# Patient Record
Sex: Female | Born: 1935 | Race: White | Hispanic: No | Marital: Single | Smoking: Former smoker
Health system: Southern US, Community
[De-identification: ages and names within clinical notes are randomized; demographics above are authoritative.]

## PROBLEM LIST (undated history)

## (undated) DIAGNOSIS — T7840XA Allergy, unspecified, initial encounter: Secondary | ICD-10-CM

## (undated) DIAGNOSIS — E785 Hyperlipidemia, unspecified: Secondary | ICD-10-CM

## (undated) DIAGNOSIS — I10 Essential (primary) hypertension: Secondary | ICD-10-CM

## (undated) DIAGNOSIS — I809 Phlebitis and thrombophlebitis of unspecified site: Secondary | ICD-10-CM

## (undated) DIAGNOSIS — M1990 Unspecified osteoarthritis, unspecified site: Secondary | ICD-10-CM

## (undated) DIAGNOSIS — J449 Chronic obstructive pulmonary disease, unspecified: Secondary | ICD-10-CM

## (undated) DIAGNOSIS — C50912 Malignant neoplasm of unspecified site of left female breast: Secondary | ICD-10-CM

## (undated) DIAGNOSIS — K219 Gastro-esophageal reflux disease without esophagitis: Secondary | ICD-10-CM

## (undated) HISTORY — DX: Essential (primary) hypertension: I10

## (undated) HISTORY — PX: TOTAL HIP ARTHROPLASTY: SHX124

## (undated) HISTORY — PX: EYE SURGERY: SHX253

## (undated) HISTORY — DX: Unspecified osteoarthritis, unspecified site: M19.90

## (undated) HISTORY — PX: MASTECTOMY: SHX3

## (undated) HISTORY — DX: Allergy, unspecified, initial encounter: T78.40XA

## (undated) HISTORY — DX: Hyperlipidemia, unspecified: E78.5

## (undated) HISTORY — DX: Phlebitis and thrombophlebitis of unspecified site: I80.9

## (undated) HISTORY — DX: Gastro-esophageal reflux disease without esophagitis: K21.9

## (undated) HISTORY — PX: JOINT REPLACEMENT: SHX530

---
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
## 2000-03-13 DIAGNOSIS — I639 Cerebral infarction, unspecified: Secondary | ICD-10-CM

## 2000-03-13 HISTORY — DX: Cerebral infarction, unspecified: I63.9

## 2011-01-02 DIAGNOSIS — R490 Dysphonia: Secondary | ICD-10-CM | POA: Insufficient documentation

## 2011-01-02 DIAGNOSIS — C50919 Malignant neoplasm of unspecified site of unspecified female breast: Secondary | ICD-10-CM | POA: Insufficient documentation

## 2011-01-02 DIAGNOSIS — I6529 Occlusion and stenosis of unspecified carotid artery: Secondary | ICD-10-CM | POA: Insufficient documentation

## 2011-01-02 DIAGNOSIS — K219 Gastro-esophageal reflux disease without esophagitis: Secondary | ICD-10-CM | POA: Insufficient documentation

## 2011-01-03 DIAGNOSIS — J381 Polyp of vocal cord and larynx: Secondary | ICD-10-CM | POA: Insufficient documentation

## 2011-06-15 DIAGNOSIS — Z9181 History of falling: Secondary | ICD-10-CM | POA: Insufficient documentation

## 2012-03-08 DIAGNOSIS — B351 Tinea unguium: Secondary | ICD-10-CM | POA: Insufficient documentation

## 2012-03-13 HISTORY — PX: BREAST BIOPSY: SHX20

## 2012-03-13 HISTORY — PX: BREAST LUMPECTOMY: SHX2

## 2012-08-21 DIAGNOSIS — G459 Transient cerebral ischemic attack, unspecified: Secondary | ICD-10-CM | POA: Insufficient documentation

## 2013-03-13 DIAGNOSIS — C50912 Malignant neoplasm of unspecified site of left female breast: Secondary | ICD-10-CM

## 2013-03-13 HISTORY — PX: TOTAL HIP ARTHROPLASTY: SHX124

## 2013-03-13 HISTORY — DX: Malignant neoplasm of unspecified site of left female breast: C50.912

## 2013-07-03 DIAGNOSIS — F3289 Other specified depressive episodes: Secondary | ICD-10-CM | POA: Insufficient documentation

## 2013-07-03 DIAGNOSIS — F329 Major depressive disorder, single episode, unspecified: Secondary | ICD-10-CM | POA: Insufficient documentation

## 2014-05-26 DIAGNOSIS — I739 Peripheral vascular disease, unspecified: Secondary | ICD-10-CM | POA: Insufficient documentation

## 2014-05-26 DIAGNOSIS — Z8781 Personal history of (healed) traumatic fracture: Secondary | ICD-10-CM | POA: Insufficient documentation

## 2014-08-22 DIAGNOSIS — Z853 Personal history of malignant neoplasm of breast: Secondary | ICD-10-CM | POA: Insufficient documentation

## 2014-12-18 DIAGNOSIS — IMO0002 Reserved for concepts with insufficient information to code with codable children: Secondary | ICD-10-CM | POA: Insufficient documentation

## 2015-11-17 DIAGNOSIS — R7303 Prediabetes: Secondary | ICD-10-CM | POA: Insufficient documentation

## 2016-05-03 DIAGNOSIS — I10 Essential (primary) hypertension: Secondary | ICD-10-CM | POA: Diagnosis not present

## 2016-05-03 DIAGNOSIS — Z79899 Other long term (current) drug therapy: Secondary | ICD-10-CM | POA: Diagnosis not present

## 2016-07-12 DIAGNOSIS — I6523 Occlusion and stenosis of bilateral carotid arteries: Secondary | ICD-10-CM | POA: Diagnosis not present

## 2016-07-12 DIAGNOSIS — Z8673 Personal history of transient ischemic attack (TIA), and cerebral infarction without residual deficits: Secondary | ICD-10-CM | POA: Diagnosis not present

## 2016-11-01 DIAGNOSIS — J439 Emphysema, unspecified: Secondary | ICD-10-CM | POA: Diagnosis not present

## 2016-11-01 DIAGNOSIS — Z0000 Encounter for general adult medical examination without abnormal findings: Secondary | ICD-10-CM | POA: Diagnosis not present

## 2016-11-01 DIAGNOSIS — R7303 Prediabetes: Secondary | ICD-10-CM | POA: Diagnosis not present

## 2016-11-01 DIAGNOSIS — I10 Essential (primary) hypertension: Secondary | ICD-10-CM | POA: Diagnosis not present

## 2016-11-07 DIAGNOSIS — L820 Inflamed seborrheic keratosis: Secondary | ICD-10-CM | POA: Diagnosis not present

## 2016-11-07 DIAGNOSIS — L7211 Pilar cyst: Secondary | ICD-10-CM | POA: Diagnosis not present

## 2016-11-07 DIAGNOSIS — I10 Essential (primary) hypertension: Secondary | ICD-10-CM | POA: Diagnosis not present

## 2017-01-11 DIAGNOSIS — E782 Mixed hyperlipidemia: Secondary | ICD-10-CM | POA: Diagnosis not present

## 2017-01-11 DIAGNOSIS — I10 Essential (primary) hypertension: Secondary | ICD-10-CM | POA: Diagnosis not present

## 2017-01-11 DIAGNOSIS — Z5181 Encounter for therapeutic drug level monitoring: Secondary | ICD-10-CM | POA: Diagnosis not present

## 2017-01-11 DIAGNOSIS — Z0000 Encounter for general adult medical examination without abnormal findings: Secondary | ICD-10-CM | POA: Diagnosis not present

## 2017-03-04 DIAGNOSIS — J069 Acute upper respiratory infection, unspecified: Secondary | ICD-10-CM | POA: Diagnosis not present

## 2017-06-13 DIAGNOSIS — M5136 Other intervertebral disc degeneration, lumbar region: Secondary | ICD-10-CM | POA: Diagnosis not present

## 2017-06-13 DIAGNOSIS — J449 Chronic obstructive pulmonary disease, unspecified: Secondary | ICD-10-CM | POA: Diagnosis not present

## 2017-06-13 DIAGNOSIS — R7303 Prediabetes: Secondary | ICD-10-CM | POA: Diagnosis not present

## 2017-06-13 DIAGNOSIS — I10 Essential (primary) hypertension: Secondary | ICD-10-CM | POA: Diagnosis not present

## 2017-06-13 DIAGNOSIS — M549 Dorsalgia, unspecified: Secondary | ICD-10-CM | POA: Diagnosis not present

## 2017-06-13 DIAGNOSIS — M4856XA Collapsed vertebra, not elsewhere classified, lumbar region, initial encounter for fracture: Secondary | ICD-10-CM | POA: Diagnosis not present

## 2017-06-13 DIAGNOSIS — E785 Hyperlipidemia, unspecified: Secondary | ICD-10-CM | POA: Diagnosis not present

## 2017-06-13 DIAGNOSIS — M47896 Other spondylosis, lumbar region: Secondary | ICD-10-CM | POA: Diagnosis not present

## 2017-06-14 DIAGNOSIS — H353132 Nonexudative age-related macular degeneration, bilateral, intermediate dry stage: Secondary | ICD-10-CM | POA: Diagnosis not present

## 2017-06-14 DIAGNOSIS — H26491 Other secondary cataract, right eye: Secondary | ICD-10-CM | POA: Diagnosis not present

## 2017-06-14 DIAGNOSIS — Z9842 Cataract extraction status, left eye: Secondary | ICD-10-CM | POA: Diagnosis not present

## 2017-06-14 DIAGNOSIS — Z9841 Cataract extraction status, right eye: Secondary | ICD-10-CM | POA: Diagnosis not present

## 2018-02-27 ENCOUNTER — Encounter: Payer: Self-pay | Admitting: Family

## 2018-02-27 ENCOUNTER — Ambulatory Visit: Payer: Medicare Other | Admitting: Family

## 2018-02-27 VITALS — BP 140/55 | HR 68 | Temp 97.6°F | Ht 66.0 in | Wt 148.8 lb

## 2018-02-27 DIAGNOSIS — I10 Essential (primary) hypertension: Secondary | ICD-10-CM

## 2018-02-27 DIAGNOSIS — Z13820 Encounter for screening for osteoporosis: Secondary | ICD-10-CM | POA: Insufficient documentation

## 2018-02-27 DIAGNOSIS — H9190 Unspecified hearing loss, unspecified ear: Secondary | ICD-10-CM

## 2018-02-27 DIAGNOSIS — F320 Major depressive disorder, single episode, mild: Secondary | ICD-10-CM | POA: Diagnosis not present

## 2018-02-27 DIAGNOSIS — J449 Chronic obstructive pulmonary disease, unspecified: Secondary | ICD-10-CM

## 2018-02-27 DIAGNOSIS — R413 Other amnesia: Secondary | ICD-10-CM

## 2018-02-27 DIAGNOSIS — Z8673 Personal history of transient ischemic attack (TIA), and cerebral infarction without residual deficits: Secondary | ICD-10-CM

## 2018-02-27 DIAGNOSIS — E785 Hyperlipidemia, unspecified: Secondary | ICD-10-CM

## 2018-02-27 DIAGNOSIS — Z0000 Encounter for general adult medical examination without abnormal findings: Secondary | ICD-10-CM | POA: Insufficient documentation

## 2018-02-27 MED ORDER — TRAZODONE HCL 50 MG PO TABS: 25.0000 mg | ORAL_TABLET | Freq: Every day | ORAL | 1 refills | Status: DC

## 2018-02-27 NOTE — Assessment & Plan Note (Signed)
Chronic.  Suspect concerns for memory are more related to hearing loss.  Referral to ear nose and throat.  Reassured by the mental status exam, 30/30.

## 2018-02-27 NOTE — Assessment & Plan Note (Addendum)
Discussed benefit and risk of Plavix.  Currently patient and I jointly agreed benefit of Plavix outweigh risk.  Discussed if she were to begin to have problems with falls, we may opt to discontinue at that time.  We will continue for now.  Of note, politely declines reestablishment with neurology at this time.

## 2018-02-27 NOTE — Assessment & Plan Note (Signed)
Systolic blood pressure in the 140s which is consistent at home. diastolic mid 01B, so I feel hesitant to increase her blood pressure medication this time.  Discussed referral to cardiology for further advice regarding this.  Patient politely declines.  Will continue current regimen at this time.

## 2018-02-27 NOTE — Assessment & Plan Note (Signed)
Pending lipid panel 

## 2018-02-27 NOTE — Progress Notes (Signed)
Subjective:    Patient ID: Natalie [name], female    DOB: 10-11-1929, 84 y.o.   MRN: [id number]  CC: [name] is a 84 y.o. female who presents today to establish care.    HPI: Recently moved from [place].  Prior PCP Dr Truman Hayward. Lives with daughter.   Retired Health and safety inspector.   Walks regularly, over 3000 steps yesterday. Would like to join permission. No recent falls. h/o right hip fracture.   On calcium.   Wearing aids. Has seen ENT in [place].  Still driving; hasnt gotten lost nor forgotten loves ones names.   History of left breast cancer 2017; s/p left mastectomy. No longer doing mammograms.   HTN- at home, 140/ 67. Denies exertional chest pain or pressure, numbness or tingling radiating to left arm or jaw, palpitations, dizziness, frequent headaches, changes in vision, or shortness of breath.    HLD-compliant with Lipitor.  H/o depression after husbands death.  Was treated at that time.  Some trouble going back to sleep. No si/hi. Declines counseling.    COPD- On Sprivia. No wheezing,increased sputum.   H/o TIA when smoking- on Plavix .  Bruises easily.  No bleeding. Had seen neurology in the past.   Former smoker        HISTORY:  Past Medical History:  Diagnosis Date  . Allergies    hay fever  . Arthritis   . GERD (gastroesophageal reflux disease)   . Hyperlipemia   . Hypertension   . Phlebitis    TIA   Past Surgical History:  Procedure Laterality Date  . BREAST BIOPSY  2014  . BREAST LUMPECTOMY  2014  . MASTECTOMY Left May 9th 2017  . RHINOPLASTY  1956  . TONSILLECTOMY  1939  . TOTAL HIP ARTHROPLASTY Right 2015   History reviewed. No pertinent family history.  Allergies: Patient has no allergy information on record. Current Outpatient Medications on File Prior to Visit  Medication Sig Dispense Refill  . amLODipine (NORVASC) 10 MG tablet Take 10 mg by mouth daily.    Marland Kitchen atorvastatin (LIPITOR) 20 MG tablet Take 20 mg by mouth daily.      Marland Kitchen b complex vitamins tablet Take 1 tablet by mouth daily.    . Calcium Citrate-Vitamin D 250-200 MG-UNIT TABS Take 250 mg by mouth every morning.    . cholecalciferol (VITAMIN D3) 25 MCG (1000 UT) tablet Take 2,000 Units by mouth daily.    . clopidogrel (PLAVIX) 75 MG tablet Take 75 mg by mouth daily.    Marland Kitchen lisinopril (PRINIVIL,ZESTRIL) 5 MG tablet Take 5 mg by mouth daily.    . metoprolol tartrate (LOPRESSOR) 50 MG tablet Take 50 mg by mouth daily.    . Tiotropium Bromide Monohydrate 2.5 MCG/ACT AERS Inhale 2 puffs into the lungs 2 (two) times daily.     No current facility-administered medications on file prior to visit.     Social History   Tobacco Use  . Smoking status: Former Research scientist (life sciences)  . Smokeless tobacco: Never Used  Substance Use Topics  . Alcohol use: Yes  . Drug use: Never    Review of Systems  Constitutional: Negative for chills and fever.  HENT: Positive for hearing loss (chronic). Negative for congestion.   Respiratory: Negative for cough, shortness of breath and wheezing.   Cardiovascular: Negative for chest pain and palpitations.  Gastrointestinal: Negative for nausea and vomiting.  Neurological: Negative for dizziness.  Hematological: Bruises/bleeds easily.  Psychiatric/Behavioral: Positive for sleep disturbance. Negative for suicidal  ideas. The patient is not nervous/anxious.       Objective:    BP (!) 140/55   Pulse 68   Temp 97.6 F (36.4 C)   Ht 5\' 6"  (1.676 m)   Wt 148 lb 12.8 oz (67.5 kg)   SpO2 98%   BMI 24.02 kg/m  BP Readings from Last 3 Encounters:  02/27/18 (!) 140/55   Wt Readings from Last 3 Encounters:  02/27/18 148 lb 12.8 oz (67.5 kg)    Physical Exam Vitals signs reviewed.  Constitutional:      Appearance: She is well-developed.  Eyes:     Conjunctiva/sclera: Conjunctivae normal.  Cardiovascular:     Rate and Rhythm: Normal rate and regular rhythm.     Pulses: Normal pulses.     Heart sounds: Normal heart sounds.  Pulmonary:      Effort: Pulmonary effort is normal.     Breath sounds: Normal breath sounds. No wheezing, rhonchi or rales.  Skin:    General: Skin is warm and dry.  Neurological:     Mental Status: She is alert.  Psychiatric:        Speech: Speech normal.        Behavior: Behavior normal.        Thought Content: Thought content normal.        Assessment & Plan:   Problem List Items Addressed This Visit      Cardiovascular and Mediastinum   Essential hypertension    Systolic blood pressure in the 140s which is consistent at home. diastolic mid 83M, so I feel hesitant to increase her blood pressure medication this time.  Discussed referral to cardiology for further advice regarding this.  Patient politely declines.  Will continue current regimen at this time.      Relevant Medications   amLODipine (NORVASC) 10 MG tablet   atorvastatin (LIPITOR) 20 MG tablet   lisinopril (PRINIVIL,ZESTRIL) 5 MG tablet   metoprolol tartrate (LOPRESSOR) 50 MG tablet   Other Relevant Orders   Comprehensive metabolic panel   Microalbumin / creatinine urine ratio     Respiratory   COPD (chronic obstructive pulmonary disease) (HCC)    Symptomatically stable.  Will continue Spiriva      Relevant Medications   Tiotropium Bromide Monohydrate 2.5 MCG/ACT AERS     Nervous and Auditory   Hearing loss    Chronic.  Suspect concerns for memory are more related to hearing loss.  Referral to ear nose and throat.  Reassured by the mental status exam, 30/30.      Relevant Orders   Ambulatory referral to ENT     Other   Encounter for medical examination to establish care - Primary   Relevant Medications   traZODone (DESYREL) 50 MG tablet   Other Relevant Orders   DG Bone Density   Ambulatory referral to ENT   Lipid panel   Comprehensive metabolic panel   CBC with Differential/Platelet   Hemoglobin A1c   TSH   VITAMIN D 25 Hydroxy (Vit-D Deficiency, Fractures)   Microalbumin / creatinine urine ratio    Screening for osteoporosis   Relevant Orders   DG Bone Density   Depression, major, single episode, mild (HCC)    Trial of trazodone.  Follow-up in 3 months.      Relevant Medications   traZODone (DESYREL) 50 MG tablet   Memory changes    Reassured by the mental status exam, 30/30.       HLD (hyperlipidemia)  Pending lipid panel.      Relevant Medications   amLODipine (NORVASC) 10 MG tablet   atorvastatin (LIPITOR) 20 MG tablet   lisinopril (PRINIVIL,ZESTRIL) 5 MG tablet   metoprolol tartrate (LOPRESSOR) 50 MG tablet   History of TIA (transient ischemic attack)    Discussed benefit and risk of Plavix.  Currently patient and I jointly agreed benefit of Plavix outweigh risk.  Discussed if she were to begin to have problems with falls, we may opt to discontinue at that time.  We will continue for now.  Of note, politely declines reestablishment with neurology at this time.          I am having Joaquim Lai A. Ashford start on traZODone. I am also having her maintain her amLODipine, atorvastatin, b complex vitamins, Calcium Citrate-Vitamin D, clopidogrel, lisinopril, metoprolol tartrate, Tiotropium Bromide Monohydrate, and cholecalciferol.   Meds ordered this encounter  Medications  . traZODone (DESYREL) 50 MG tablet    Sig: Take 0.5 tablets (25 mg total) by mouth at bedtime.    Dispense:  90 tablet    Refill:  1    Order Specific Question:   Supervising Provider    Answer:   Crecencio Mc [[id number]]    Return precautions given.   Risks, benefits, and alternatives of the medications and treatment plan prescribed today were discussed, and patient expressed understanding.   Education regarding symptom management and diagnosis given to patient on AVS.  Continue to follow with [name], FNP for routine health maintenance.   Natalie [name] and I agreed with plan.   Mable Paris, FNP

## 2018-02-27 NOTE — Assessment & Plan Note (Signed)
Symptomatically stable.  Will continue Spiriva

## 2018-02-27 NOTE — Assessment & Plan Note (Signed)
Trial of trazodone.  Follow-up in 3 months.

## 2018-02-27 NOTE — Assessment & Plan Note (Signed)
Reassured by the mental status exam, 30/30.

## 2018-02-27 NOTE — Patient Instructions (Addendum)
Trial of trazodone at bedtime. Fasting labs when can-you can make this appointment at checkout  Monitor blood pressure,  Goal is less than 140/80, based on newest guidelines; if persistently higher, please make sooner follow up appointment so we can recheck you blood pressure and manage medications.    such a pleasure meeting you!

## 2018-03-04 ENCOUNTER — Other Ambulatory Visit: Payer: Medicare Other

## 2018-03-12 ENCOUNTER — Other Ambulatory Visit (INDEPENDENT_AMBULATORY_CARE_PROVIDER_SITE_OTHER): Payer: Medicare Other

## 2018-03-12 DIAGNOSIS — I10 Essential (primary) hypertension: Secondary | ICD-10-CM

## 2018-03-12 DIAGNOSIS — Z0000 Encounter for general adult medical examination without abnormal findings: Secondary | ICD-10-CM | POA: Diagnosis not present

## 2018-03-12 LAB — MICROALBUMIN / CREATININE URINE RATIO
Creatinine,U: 95.3 mg/dL
Microalb Creat Ratio: 2.9 mg/g (ref 0.0–30.0)
Microalb, Ur: 2.8 mg/dL — ABNORMAL HIGH (ref 0.0–1.9)

## 2018-03-12 LAB — CBC WITH DIFFERENTIAL/PLATELET
Basophils Absolute: 0.1 10*3/uL (ref 0.0–0.1)
Basophils Relative: 0.8 % (ref 0.0–3.0)
Eosinophils Absolute: 0.2 10*3/uL (ref 0.0–0.7)
Eosinophils Relative: 2.7 % (ref 0.0–5.0)
HCT: 36.7 % (ref 36.0–46.0)
Hemoglobin: 12.5 g/dL (ref 12.0–15.0)
Lymphocytes Relative: 21.1 % (ref 12.0–46.0)
Lymphs Abs: 1.5 10*3/uL (ref 0.7–4.0)
MCHC: 34.1 g/dL (ref 30.0–36.0)
MCV: 92.5 fl (ref 78.0–100.0)
Monocytes Absolute: 0.7 10*3/uL (ref 0.1–1.0)
Monocytes Relative: 10.3 % (ref 3.0–12.0)
Neutro Abs: 4.7 10*3/uL (ref 1.4–7.7)
Neutrophils Relative %: 65.1 % (ref 43.0–77.0)
Platelets: 207 10*3/uL (ref 150.0–400.0)
RBC: 3.97 Mil/uL (ref 3.87–5.11)
RDW: 13.4 % (ref 11.5–15.5)
WBC: 7.2 10*3/uL (ref 4.0–10.5)

## 2018-03-12 LAB — LIPID PANEL
Cholesterol: 141 mg/dL (ref 0–200)
HDL: 45.5 mg/dL (ref >39.00)
LDL Cholesterol: 69 mg/dL (ref 0–99)
NonHDL: 95.43
Total CHOL/HDL Ratio: 3
Triglycerides: 133 mg/dL (ref 0.0–149.0)
VLDL: 26.6 mg/dL (ref 0.0–40.0)

## 2018-03-12 LAB — VITAMIN D 25 HYDROXY (VIT D DEFICIENCY, FRACTURES): VITD: 57.81 ng/mL (ref 30.00–100.00)

## 2018-03-12 LAB — COMPREHENSIVE METABOLIC PANEL
ALT: 12 U/L (ref 0–35)
AST: 15 U/L (ref 0–37)
Albumin: 4.3 g/dL (ref 3.5–5.2)
Alkaline Phosphatase: 53 U/L (ref 39–117)
BUN: 18 mg/dL (ref 6–23)
CO2: 28 mEq/L (ref 19–32)
Calcium: 9.8 mg/dL (ref 8.4–10.5)
Chloride: 102 mEq/L (ref 96–112)
Creatinine, Ser: 0.98 mg/dL (ref 0.40–1.20)
GFR: 57.11 mL/min — ABNORMAL LOW (ref >60.00)
Glucose, Bld: 126 mg/dL — ABNORMAL HIGH (ref 70–99)
Potassium: 4.4 mEq/L (ref 3.5–5.1)
Sodium: 138 mEq/L (ref 135–145)
Total Bilirubin: 0.6 mg/dL (ref 0.2–1.2)
Total Protein: 6.8 g/dL (ref 6.0–8.3)

## 2018-03-12 LAB — HEMOGLOBIN A1C: Hgb A1c MFr Bld: 6.3 % (ref 4.6–6.5)

## 2018-03-12 LAB — TSH: TSH: 2.64 u[IU]/mL (ref 0.35–4.50)

## 2018-03-18 ENCOUNTER — Other Ambulatory Visit: Payer: Self-pay | Admitting: Family

## 2018-03-18 NOTE — Telephone Encounter (Signed)
Copied from [place] [id number]. Topic: Quick Communication - Rx Refill/Question >> Mar 18, 2018 10:54 AM Bea Graff, NT wrote: Medication: clopidogrel (PLAVIX) 75 MG tablet, metoprolol tartrate (LOPRESSOR) 50 MG tablet, amLODipine (NORVASC) 10 MG tablet, lisinopril (PRINIVIL,ZESTRIL) 5 MG tablet, atorvastatin (LIPITOR) 20 MG tablet   Has the patient contacted their pharmacy? Yes.   (Agent: If no, request that the patient contact the pharmacy for the refill.) (Agent: If yes, when and what did the pharmacy advise?)  Preferred Pharmacy (with phone number or street name): [id number] [address], [place] [phone] (Phone) [phone] (Fax)    Agent: Please be advised that RX refills may take up to 3 business days. We ask that you follow-up with your pharmacy.

## 2018-03-25 DIAGNOSIS — H6123 Impacted cerumen, bilateral: Secondary | ICD-10-CM | POA: Diagnosis not present

## 2018-03-25 DIAGNOSIS — J309 Allergic rhinitis, unspecified: Secondary | ICD-10-CM | POA: Diagnosis not present

## 2018-03-25 DIAGNOSIS — H903 Sensorineural hearing loss, bilateral: Secondary | ICD-10-CM | POA: Diagnosis not present

## 2018-03-26 ENCOUNTER — Ambulatory Visit
Admission: RE | Admit: 2018-03-26 | Discharge: 2018-03-26 | Disposition: A | Discharge status: Home / Self Care | Payer: Medicare Other | Source: Ambulatory Visit | Attending: Family | Admitting: Family

## 2018-03-26 ENCOUNTER — Encounter: Payer: Self-pay | Admitting: Radiology

## 2018-03-26 DIAGNOSIS — M85852 Other specified disorders of bone density and structure, left thigh: Secondary | ICD-10-CM | POA: Diagnosis not present

## 2018-03-26 DIAGNOSIS — Z780 Asymptomatic menopausal state: Secondary | ICD-10-CM | POA: Diagnosis not present

## 2018-03-26 DIAGNOSIS — Z0000 Encounter for general adult medical examination without abnormal findings: Secondary | ICD-10-CM | POA: Insufficient documentation

## 2018-03-26 DIAGNOSIS — Z13820 Encounter for screening for osteoporosis: Secondary | ICD-10-CM | POA: Diagnosis not present

## 2018-05-29 ENCOUNTER — Ambulatory Visit (INDEPENDENT_AMBULATORY_CARE_PROVIDER_SITE_OTHER): Payer: Medicare Other | Admitting: Family

## 2018-05-29 ENCOUNTER — Other Ambulatory Visit: Payer: Self-pay

## 2018-05-29 ENCOUNTER — Encounter: Payer: Self-pay | Admitting: Family

## 2018-05-29 DIAGNOSIS — F320 Major depressive disorder, single episode, mild: Secondary | ICD-10-CM | POA: Diagnosis not present

## 2018-05-29 DIAGNOSIS — I10 Essential (primary) hypertension: Secondary | ICD-10-CM

## 2018-05-29 DIAGNOSIS — Z0000 Encounter for general adult medical examination without abnormal findings: Secondary | ICD-10-CM

## 2018-05-29 MED ORDER — TRAZODONE HCL 50 MG PO TABS: 25.0000 mg | ORAL_TABLET | Freq: Every day | ORAL | 1 refills | Status: DC

## 2018-05-29 NOTE — Patient Instructions (Signed)
Please continue to monitor your blood pressure however it was not making changes at this time.  Overall pleased.  Goal of 130/55 I think is reasonable.  Please let me know if this the bottom number is any lower than that. Stay safe!

## 2018-05-29 NOTE — Progress Notes (Signed)
Subjective:    Patient ID: Natalie [name], female    DOB: 1929-06-01, 85 y.o.   MRN: [id number]  CC: [name] is a 85 y.o. female who presents today for follow up.   HPI: Feels well today, no complaints   HTN- at home 130's. Had been on higher dose of lisinopril, has been 10mg .   Hearing loss- chronic. Unchanged. has seen Dr Tami Ribas. Started on flonase and congestion improved  Not as concerned about memory. Declines further evaluation , referral for this.     HISTORY:  Past Medical History:  Diagnosis Date  . Allergies    hay fever  . Arthritis   . GERD (gastroesophageal reflux disease)   . Hyperlipemia   . Hypertension   . Phlebitis    TIA   Past Surgical History:  Procedure Laterality Date  . BREAST BIOPSY  2014  . BREAST LUMPECTOMY  2014  . MASTECTOMY Left May 9th 2017  . RHINOPLASTY  1956  . TONSILLECTOMY  1939  . TOTAL HIP ARTHROPLASTY Right 2015   History reviewed. No pertinent family history.  Allergies: Influenza vaccines Current Outpatient Medications on File Prior to Visit  Medication Sig Dispense Refill  . amLODipine (NORVASC) 10 MG tablet TAKE ONE TABLET (10 MG TOTAL) BY MOUTH DAILY. 90 tablet 0  . atorvastatin (LIPITOR) 20 MG tablet TAKE 1 TABLET BY MOUTH EVERY DAY 90 tablet 0  . b complex vitamins tablet Take 1 tablet by mouth daily.    . Calcium Citrate-Vitamin D 250-200 MG-UNIT TABS Take 250 mg by mouth every morning.    . cholecalciferol (VITAMIN D3) 25 MCG (1000 UT) tablet Take 2,000 Units by mouth daily.    . clopidogrel (PLAVIX) 75 MG tablet TAKE 1 TABLET BY MOUTH EVERY DAY 90 tablet 3  . lisinopril (PRINIVIL,ZESTRIL) 5 MG tablet Take 5 mg by mouth daily.    . metoprolol tartrate (LOPRESSOR) 50 MG tablet TAKE 1 TABLET BY MOUTH EVERY DAY 90 tablet 0  . Tiotropium Bromide Monohydrate 2.5 MCG/ACT AERS Inhale 2 puffs into the lungs 2 (two) times daily.     No current facility-administered medications on file prior to visit.      Social History   Tobacco Use  . Smoking status: Former Research scientist (life sciences)  . Smokeless tobacco: Never Used  Substance Use Topics  . Alcohol use: Yes  . Drug use: Never    Review of Systems  Constitutional: Negative for chills and fever.  HENT: Positive for rhinorrhea (chronic, improved).   Respiratory: Negative for cough.   Cardiovascular: Negative for chest pain and palpitations.  Gastrointestinal: Negative for nausea and vomiting.      Objective:    BP 130/60   Pulse 75   Temp 97.7 F (36.5 C)   Wt 148 lb 12.8 oz (67.5 kg)   SpO2 97%   BMI 24.02 kg/m  BP Readings from Last 3 Encounters:  05/29/18 130/60  02/27/18 (!) 140/55   Wt Readings from Last 3 Encounters:  05/29/18 148 lb 12.8 oz (67.5 kg)  02/27/18 148 lb 12.8 oz (67.5 kg)    Physical Exam Vitals signs reviewed.  Constitutional:      Appearance: She is well-developed.  Eyes:     Conjunctiva/sclera: Conjunctivae normal.  Cardiovascular:     Rate and Rhythm: Normal rate and regular rhythm.     Pulses: Normal pulses.     Heart sounds: Normal heart sounds.  Pulmonary:     Effort: Pulmonary effort is normal.  Breath sounds: Normal breath sounds. No wheezing, rhonchi or rales.  Skin:    General: Skin is warm and dry.  Neurological:     Mental Status: She is alert.  Psychiatric:        Speech: Speech normal.        Behavior: Behavior normal.        Thought Content: Thought content normal.        Assessment & Plan:   Problem List Items Addressed This Visit      Cardiovascular and Mediastinum   Essential hypertension    Overall pleased blood pressure today.  Patient will continue to monitor of course, however we made no changes today        Other   Encounter for medical examination to establish care   Relevant Medications   traZODone (DESYREL) 50 MG tablet   Depression, major, single episode, mild (HCC)   Relevant Medications   traZODone (DESYREL) 50 MG tablet       I am having Joaquim Lai A.  Cornacchia maintain her b complex vitamins, Calcium Citrate-Vitamin D, lisinopril, Tiotropium Bromide Monohydrate, cholecalciferol, metoprolol tartrate, amLODipine, clopidogrel, atorvastatin, and traZODone.   Meds ordered this encounter  Medications  . traZODone (DESYREL) 50 MG tablet    Sig: Take 0.5 tablets (25 mg total) by mouth at bedtime.    Dispense:  90 tablet    Refill:  1    Order Specific Question:   Supervising Provider    Answer:   Crecencio Mc [[id number]]    Return precautions given.   Risks, benefits, and alternatives of the medications and treatment plan prescribed today were discussed, and patient expressed understanding.   Education regarding symptom management and diagnosis given to patient on AVS.  Continue to follow with [name], FNP for routine health maintenance.   Natalie [name] and I agreed with plan.   Mable Paris, FNP

## 2018-05-29 NOTE — Assessment & Plan Note (Signed)
Overall pleased blood pressure today.  Patient will continue to monitor of course, however we made no changes today

## 2018-06-10 ENCOUNTER — Other Ambulatory Visit: Payer: Self-pay | Admitting: Family

## 2018-06-14 ENCOUNTER — Other Ambulatory Visit: Payer: Self-pay | Admitting: Family

## 2018-08-30 ENCOUNTER — Ambulatory Visit: Payer: Medicare Other | Admitting: Family

## 2018-09-03 ENCOUNTER — Other Ambulatory Visit: Payer: Self-pay | Admitting: Family

## 2018-09-09 ENCOUNTER — Ambulatory Visit: Payer: Medicare Other | Admitting: Family

## 2018-09-09 ENCOUNTER — Other Ambulatory Visit: Payer: Self-pay

## 2018-09-09 ENCOUNTER — Encounter: Payer: Self-pay | Admitting: Family

## 2018-09-09 ENCOUNTER — Encounter: Payer: Medicare Other | Admitting: Family

## 2018-09-09 NOTE — Progress Notes (Signed)
This visit type was conducted due to national recommendations for restrictions regarding the COVID-19 pandemic (e.g. social distancing).  This format is felt to be most appropriate for this patient at this time.  All issues noted in this document were discussed and addressed.  No physical exam was performed (except for noted visual exam findings with Video Visits). Virtual Visit via Video Note  I connected with@  on 09/09/18 at  5:00 PM EDT by a video enabled telemedicine application and verified that I am speaking with the correct person using two identifiers.  Location patient: home Location provider:work or home office Persons participating in the virtual visit: patient, provider  I discussed the limitations of evaluation and management by telemedicine and the availability of in person appointments. The patient expressed understanding and agreed to proceed.   HPI:    ROS: See pertinent positives and negatives per HPI.  Past Medical History:  Diagnosis Date  . Allergies    hay fever  . Arthritis   . GERD (gastroesophageal reflux disease)   . Hyperlipemia   . Hypertension   . Phlebitis    TIA    Past Surgical History:  Procedure Laterality Date  . BREAST BIOPSY  2014  . BREAST LUMPECTOMY  2014  . MASTECTOMY Left May 9th 2017  . RHINOPLASTY  1956  . TONSILLECTOMY  1939  . TOTAL HIP ARTHROPLASTY Right 2015    History reviewed. No pertinent family history.     Current Outpatient Medications:  .  amLODipine (NORVASC) 10 MG tablet, TAKE 1 TABLET BY MOUTH EVERY DAY, Disp: 90 tablet, Rfl: 0 .  atorvastatin (LIPITOR) 20 MG tablet, TAKE 1 TABLET BY MOUTH EVERY DAY, Disp: 90 tablet, Rfl: 0 .  b complex vitamins tablet, Take 1 tablet by mouth daily., Disp: , Rfl:  .  Calcium Citrate-Vitamin D 250-200 MG-UNIT TABS, Take 250 mg by mouth every morning., Disp: , Rfl:  .  cholecalciferol (VITAMIN D3) 25 MCG (1000 UT) tablet, Take 2,000 Units by mouth daily., Disp: , Rfl:  .   clopidogrel (PLAVIX) 75 MG tablet, TAKE 1 TABLET BY MOUTH EVERY DAY, Disp: 90 tablet, Rfl: 3 .  lisinopril (ZESTRIL) 5 MG tablet, TAKE ONE TABLET (5 MG DOSE) BY MOUTH DAILY., Disp: 90 tablet, Rfl: 0 .  metoprolol tartrate (LOPRESSOR) 50 MG tablet, TAKE 1 TABLET BY MOUTH EVERY DAY, Disp: 90 tablet, Rfl: 0 .  Tiotropium Bromide Monohydrate 2.5 MCG/ACT AERS, Inhale 2 puffs into the lungs 2 (two) times daily., Disp: , Rfl:  .  traZODone (DESYREL) 50 MG tablet, Take 0.5 tablets (25 mg total) by mouth at bedtime., Disp: 90 tablet, Rfl: 1  EXAM:  VITALS per patient if applicable:  GENERAL: alert, oriented, appears well and in no acute distress  HEENT: atraumatic, conjunttiva clear, no obvious abnormalities on inspection of external nose and ears  NECK: normal movements of the head and neck  LUNGS: on inspection no signs of respiratory distress, breathing rate appears normal, no obvious gross SOB, gasping or wheezing  CV: no obvious cyanosis  MS: moves all visible extremities without noticeable abnormality  PSYCH/NEURO: pleasant and cooperative, no obvious depression or anxiety, speech and thought processing grossly intact  ASSESSMENT AND PLAN:  Discussed the following assessment and plan:  No diagnosis found.     I discussed the assessment and treatment plan with the patient. The patient was provided an opportunity to ask questions and all were answered. The patient agreed with the plan and demonstrated an understanding of  the instructions.   The patient was advised to call back or seek an in-person evaluation if the symptoms worsen or if the condition fails to improve as anticipated.   Mable Paris, FNP   This encounter was created in error - please disregard.

## 2018-09-10 ENCOUNTER — Telehealth: Payer: Self-pay

## 2018-09-10 NOTE — Telephone Encounter (Signed)
Copied from [place] [id number]. Topic: Quick Communication - See Telephone Encounter >> Sep 09, 2018  6:03 PM Loma Boston wrote: CRM for notification. See Telephone encounter for: 09/09/18. PT is so sorry that she missed appt today! @ 5:00 pm She waited all day for the appt and she was expecting a call,  expecting her phone to ring the second time after the first phone call. She says may have not understood the process and is very sorry. Would you please reach back out to her concerning this appt tomorrow?   After hrs

## 2018-09-11 ENCOUNTER — Telehealth: Payer: Self-pay | Admitting: Family

## 2018-09-11 NOTE — Telephone Encounter (Signed)
Call pt We were unable to connect on Monday.  Please offer another appt. If she was aware of this appt and doesn't reschedule, we will have to give no show fee.

## 2018-09-20 ENCOUNTER — Other Ambulatory Visit: Payer: Self-pay

## 2018-09-20 ENCOUNTER — Encounter: Payer: Self-pay | Admitting: Family

## 2018-09-20 ENCOUNTER — Ambulatory Visit (INDEPENDENT_AMBULATORY_CARE_PROVIDER_SITE_OTHER): Payer: Medicare Other | Admitting: Family

## 2018-09-20 DIAGNOSIS — Z8673 Personal history of transient ischemic attack (TIA), and cerebral infarction without residual deficits: Secondary | ICD-10-CM | POA: Diagnosis not present

## 2018-09-20 DIAGNOSIS — I10 Essential (primary) hypertension: Secondary | ICD-10-CM | POA: Diagnosis not present

## 2018-09-20 DIAGNOSIS — R0789 Other chest pain: Secondary | ICD-10-CM | POA: Diagnosis not present

## 2018-09-20 NOTE — Progress Notes (Signed)
Verbal consent for services obtained from patient prior to services given.  Location of call:  provider at work patient at home   Names of all persons present for services: Mable Paris, NP Chief complaint:   Left sided pain where 'breast was' when laying on left side in bed. Occurred 1-2 episode over last 2 months. Doesn't occur with activity.  Moved into 'comfortable position' and resolved on it's own. Didn't feel the need to take medication or try lidocaine patch. No shoulder pain, known injury. NO swelling, redness, rash.   H/o left mastectomy; no chemo or radiation.  XR thoracic spine 06/2017- chronic compression fracture L1.  No associated exertional chest pain or pressure, numbness or tingling radiating to left arm or jaw, palpitations, dizziness, frequent headaches, changes in vision, or shortness of breath.  No night sweats, unintentional weight loss.  H/o GERD. No regurgitation, epigastric burning. Had been on prilosec.   HTN- compliant with medication. At home 130-160/ 58-60. HR 56-65.   H/o carotid stenosis. No sudden vision loss.    History, background, results pertinent:   Needs prevnar 13  NO longer doing mammograms.   A/P/next steps: Problem List Items Addressed This Visit      Cardiovascular and Mediastinum   Essential hypertension    Labile.  Will trial taking medications in the evening and let me know if improves.         Other   History of TIA (transient ischemic attack) - Primary   Relevant Orders   US Carotid Duplex Bilateral   Chest wall pain    Appears musculoskeletal however discussed limitations of telemedicine in diagnosis. H/o left breast cancer. Declines CT chest, c-spine. Will let me know if recurs.           I spent 25 min  discussing plan of care over the phone.

## 2018-09-20 NOTE — Patient Instructions (Addendum)
Let me know if chest pain recurs - this is very important.  Certainly if you have  exertional chest pain or pressure, numbness or tingling radiating to left arm or jaw, palpitations, dizziness, frequent headaches, changes in vision, or shortness of breath- go to the emergency room.    Today we discussed referrals, orders. Carotid ultrasound   I have placed these orders in the system for you.  Please be sure to give [name] a call if you have not heard from our office regarding this. We should hear from [name] within ONE week with information regarding your appointment. If not, please let me know immediately.   Try taking  Blood pressure medication at bedtime and see if improves  Tetanus at local pharmacy  You also need th Prevnar 26 April 2019.   Stay safe!

## 2018-09-20 NOTE — Assessment & Plan Note (Addendum)
Appears musculoskeletal however discussed limitations of telemedicine in diagnosis. H/o left breast cancer. Declines CT chest, c-spine. Will let me know if recurs.

## 2018-09-20 NOTE — Assessment & Plan Note (Addendum)
Labile.  Will trial taking medications in the evening and let me know if improves.

## 2018-09-23 DIAGNOSIS — H903 Sensorineural hearing loss, bilateral: Secondary | ICD-10-CM | POA: Diagnosis not present

## 2018-09-23 DIAGNOSIS — H6123 Impacted cerumen, bilateral: Secondary | ICD-10-CM | POA: Diagnosis not present

## 2018-10-11 ENCOUNTER — Other Ambulatory Visit: Payer: Self-pay | Admitting: Family

## 2018-10-11 ENCOUNTER — Ambulatory Visit
Admission: RE | Admit: 2018-10-11 | Discharge: 2018-10-11 | Disposition: A | Discharge status: Home / Self Care | Payer: Medicare Other | Source: Ambulatory Visit | Attending: Family | Admitting: Family

## 2018-10-11 ENCOUNTER — Other Ambulatory Visit: Payer: Self-pay

## 2018-10-11 DIAGNOSIS — I6523 Occlusion and stenosis of bilateral carotid arteries: Secondary | ICD-10-CM | POA: Diagnosis not present

## 2018-10-11 DIAGNOSIS — Z8673 Personal history of transient ischemic attack (TIA), and cerebral infarction without residual deficits: Secondary | ICD-10-CM | POA: Diagnosis not present

## 2018-10-11 DIAGNOSIS — I6522 Occlusion and stenosis of left carotid artery: Secondary | ICD-10-CM

## 2018-10-11 DIAGNOSIS — I6503 Occlusion and stenosis of bilateral vertebral arteries: Secondary | ICD-10-CM | POA: Diagnosis not present

## 2018-10-11 IMAGING — US BILATERAL CAROTID DUPLEX ULTRASOUND
1 series · 13 of 24 positions shown · non-contrast
Comparison: None.

CLINICAL DATA: History of TIA, syncopal episode and hyperlipidemia.

EXAM:
BILATERAL CAROTID DUPLEX ULTRASOUND
TECHNIQUE: Gray scale imaging, color Doppler and duplex ultrasound were
performed of bilateral carotid and vertebral arteries in the neck.

[Series 1: bilateral carotid duplex ultrasound · 13 of 66 slices shown]
[im 1/66]
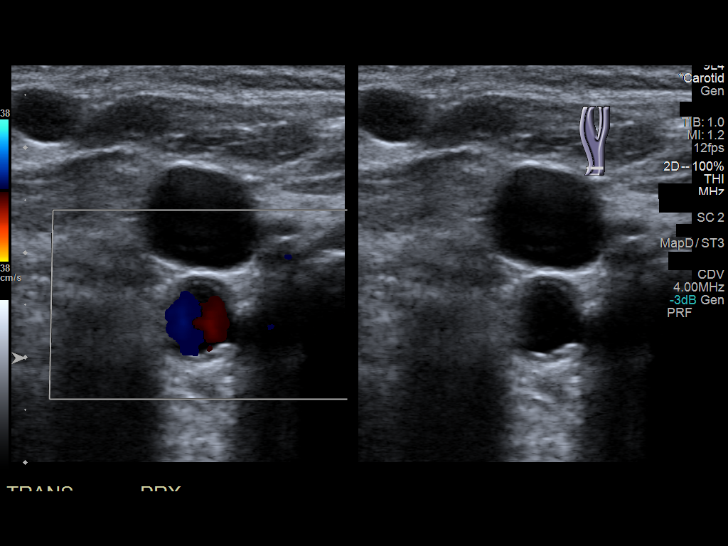
[im 6/66]
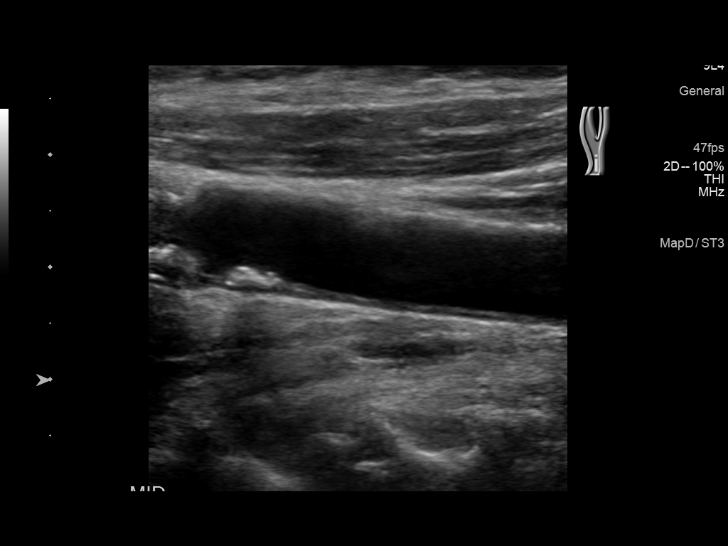
[im 12/66]
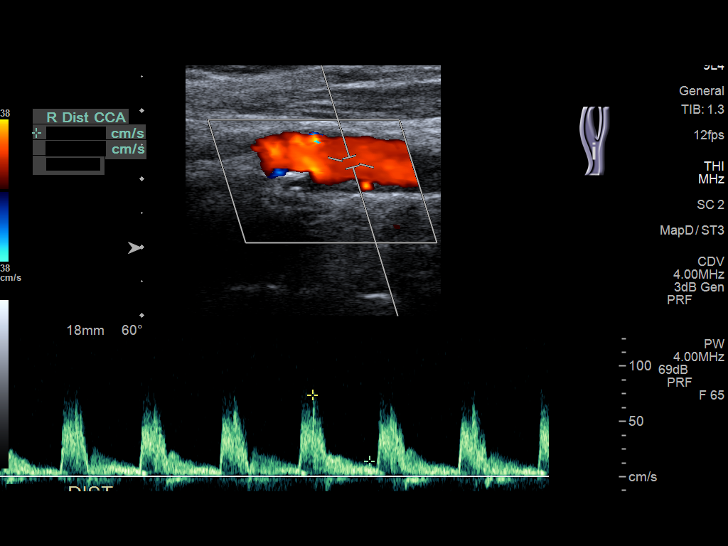
[im 17/66]
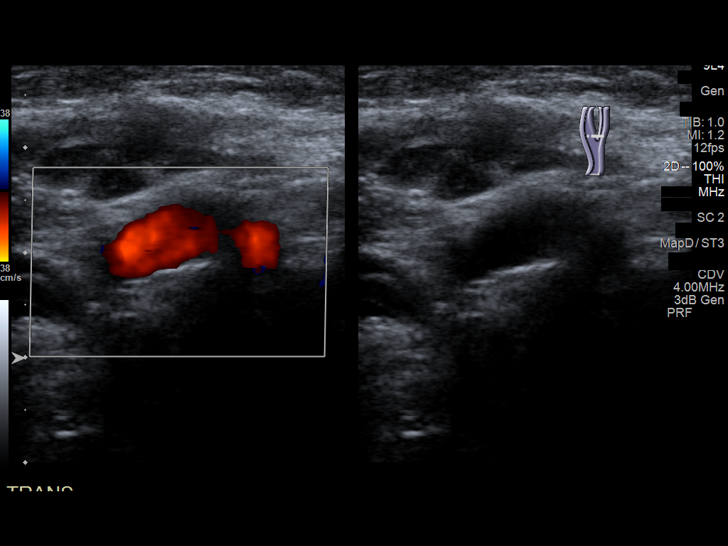
[im 23/66]
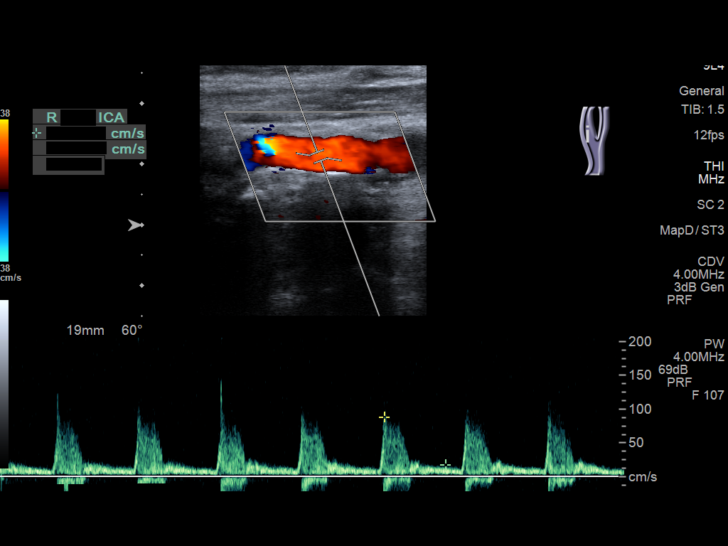
[im 29/66]
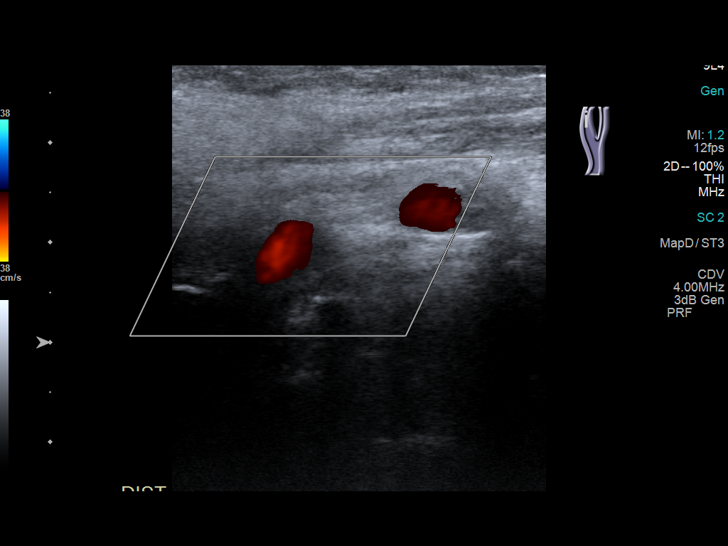
[im 34/66]
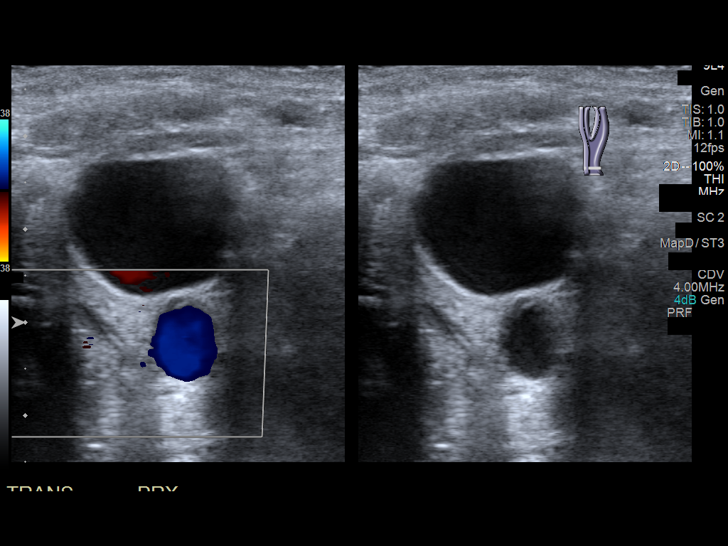
[im 37/66]
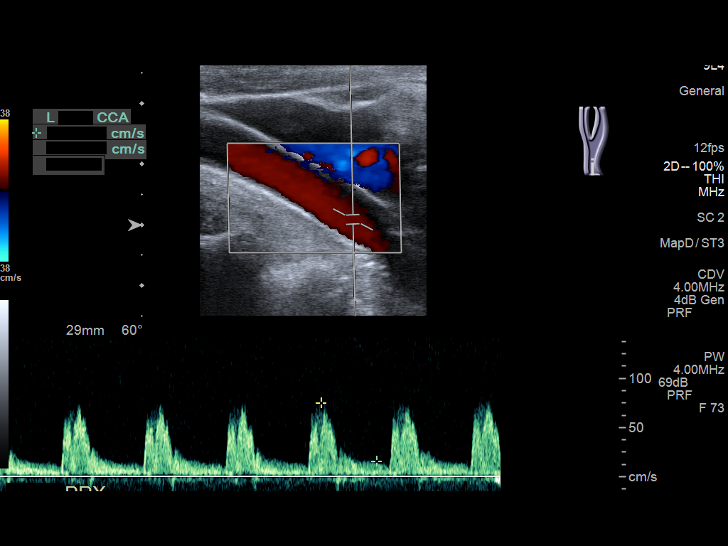
[im 43/66]
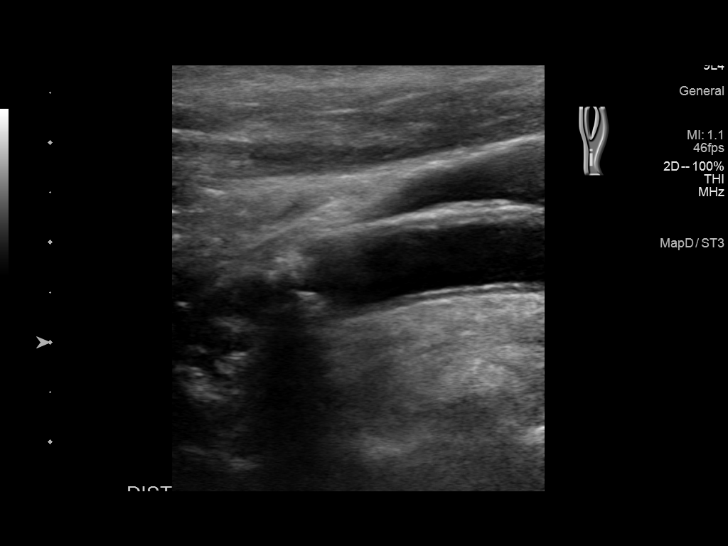
[im 49/66]
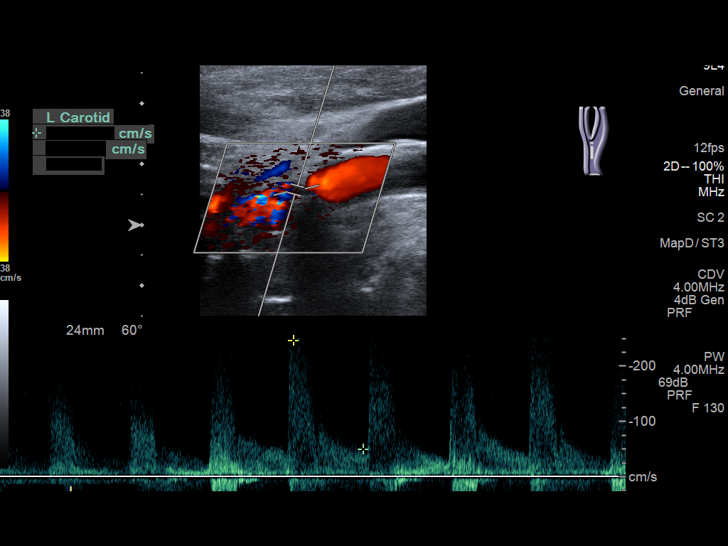
[im 54/66]
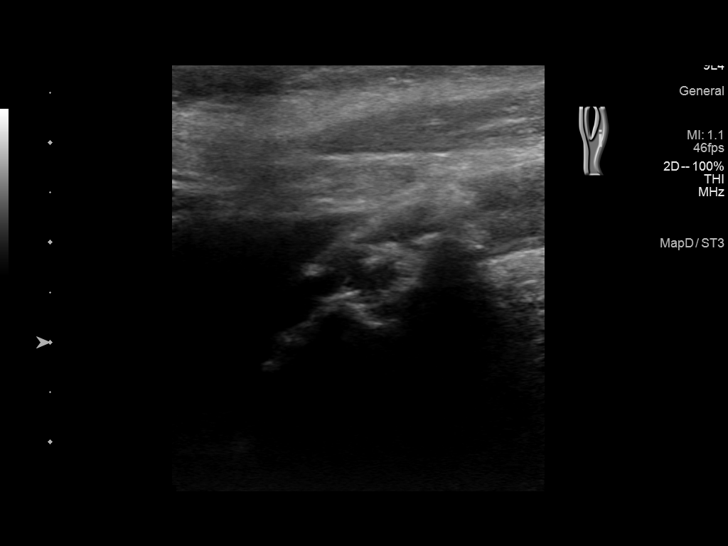
[im 60/66]
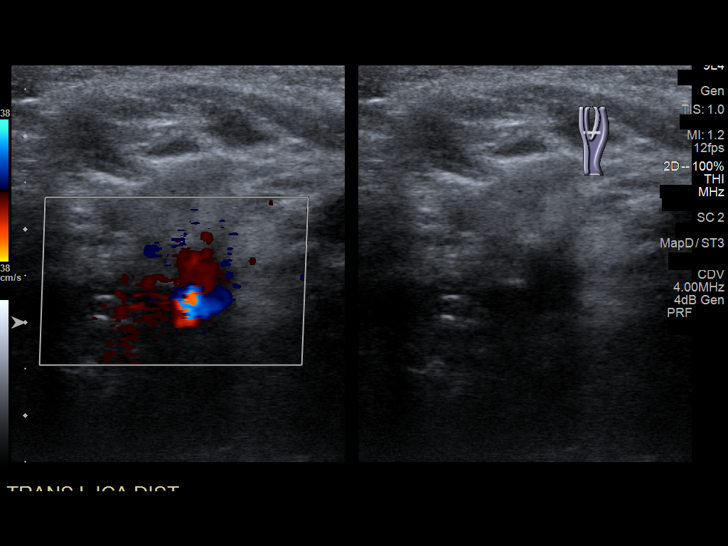
[im 66/66]
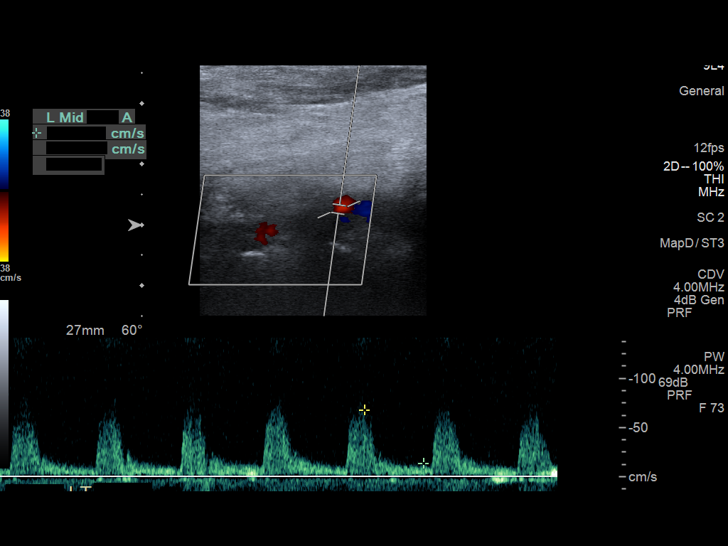

[13 of 24 positions shown; findings below may reference images not displayed]

FINDINGS: Criteria: Quantification of carotid stenosis is based on velocity
parameters that correlate the residual internal carotid diameter
with NASCET-based stenosis levels, using the diameter of the distal
internal carotid lumen as the denominator for stenosis measurement.

The following velocity measurements were obtained:

RIGHT

ICA: 102/22 cm/sec

CCA: 82/13 cm/sec

SYSTOLIC ICA/CCA RATIO:

ECA: 172 cm/sec

LEFT

ICA: 316/44 cm/sec

CCA: 67/14 cm/sec

SYSTOLIC ICA/CCA RATIO:

ECA: 160 cm/sec

RIGHT CAROTID ARTERY: There is a moderate amount of eccentric
echogenic plaque with the distal aspect the right common carotid
artery (image 11). There is a moderate to large amount of eccentric
echogenic plaque involving the origin and proximal aspects the right
internal carotid artery (image 22, which does not result in elevated
peak systolic velocities within the interrogated of course the right
internal carotid artery to suggest a hemodynamically significant
stenosis.

RIGHT VERTEBRAL ARTERY:  Antegrade Flow

LEFT CAROTID ARTERY: There is a large amount of eccentric echogenic
plaque within the left carotid bulb (image 48), which extends to
involve the origin and proximal aspect of the left internal carotid
artery (image 55) resulting in a sonographic string sign and
subtotal occlusion. Elevated velocities are seen throughout the
interrogated course the left internal carotid artery. Greatest
acquired peak systolic velocity within the proximal left ICA
measures 316 centimeters/second (image 57).

LEFT VERTEBRAL ARTERY:  Antegrade Flow
IMPRESSION: 1. Large amount of left-sided atherosclerotic plaque results in a
sonographic string sign, subtotal occlusion in markedly elevated
peak systolic velocities within the left internal carotid artery.
Further evaluation with CTA could performed as clinically indicated.
2. Moderate to large amount of right-sided atherosclerotic plaque,
not resulting in a hemodynamically significant stenosis.

This was made a call report.

## 2018-10-14 ENCOUNTER — Telehealth: Payer: Self-pay | Admitting: Family

## 2018-10-14 DIAGNOSIS — Z9841 Cataract extraction status, right eye: Secondary | ICD-10-CM | POA: Diagnosis not present

## 2018-10-14 DIAGNOSIS — H26491 Other secondary cataract, right eye: Secondary | ICD-10-CM | POA: Diagnosis not present

## 2018-10-14 DIAGNOSIS — H353132 Nonexudative age-related macular degeneration, bilateral, intermediate dry stage: Secondary | ICD-10-CM | POA: Diagnosis not present

## 2018-10-14 DIAGNOSIS — Z9842 Cataract extraction status, left eye: Secondary | ICD-10-CM | POA: Diagnosis not present

## 2018-10-14 NOTE — Telephone Encounter (Signed)
Call pt I heard back from vascular, Dr Delana Meyer.   He will hold on any images ( CT) until she is seen in office.   Ask her to call us if she doesn't hear from [name] with an appt in the next week

## 2018-10-14 NOTE — Telephone Encounter (Signed)
LM & advised on below. I asked that she call back if any further questions.

## 2018-10-16 ENCOUNTER — Telehealth: Payer: Self-pay

## 2018-10-16 NOTE — Telephone Encounter (Signed)
Patient called stating that she had missed a call from our office. I advised her of Dr. Nino Parsley message & that his office had tried to contact her as well. She apologized for not keeping in her hearing aids & I provided her their number toi call them back. Patient will call to make appointments.

## 2018-10-17 ENCOUNTER — Other Ambulatory Visit: Payer: Self-pay

## 2018-10-17 ENCOUNTER — Encounter (INDEPENDENT_AMBULATORY_CARE_PROVIDER_SITE_OTHER): Payer: Self-pay | Admitting: Vascular Surgery

## 2018-10-17 ENCOUNTER — Ambulatory Visit (INDEPENDENT_AMBULATORY_CARE_PROVIDER_SITE_OTHER): Payer: Medicare Other | Admitting: Vascular Surgery

## 2018-10-17 VITALS — BP 161/69 | HR 76 | Resp 16 | Ht 65.0 in | Wt 148.0 lb

## 2018-10-17 DIAGNOSIS — I6522 Occlusion and stenosis of left carotid artery: Secondary | ICD-10-CM | POA: Insufficient documentation

## 2018-10-17 DIAGNOSIS — J449 Chronic obstructive pulmonary disease, unspecified: Secondary | ICD-10-CM

## 2018-10-17 DIAGNOSIS — E785 Hyperlipidemia, unspecified: Secondary | ICD-10-CM | POA: Diagnosis not present

## 2018-10-17 DIAGNOSIS — I10 Essential (primary) hypertension: Secondary | ICD-10-CM | POA: Diagnosis not present

## 2018-10-17 NOTE — Progress Notes (Signed)
MRN : [id number]  Natalie [name] is a 85 y.o. ([birth year]) female who presents with chief complaint of No chief complaint on file. Marland Kitchen  History of Present Illness:  The patient is seen for evaluation of carotid stenosis. The carotid stenosis was identified after a duplex ultrasound of the carotid arteries on 10/11/2018.  The Duplex was ordered secondary to an episode of syncope.  Duplex shows a "string sign of the left ICA.  The patient denies amaurosis fugax. There is no recent history of TIA symptoms or focal motor deficits. There is no prior documented CVA.  There is no history of migraine headaches. There is no history of seizures.  The patient is taking enteric-coated aspirin 81 mg daily.  The patient has a history of coronary artery disease, no recent episodes of angina or shortness of breath. The patient denies PAD or claudication symptoms. There is a history of hyperlipidemia which is being treated with a statin.    No outpatient medications have been marked as taking for the 10/17/18 encounter (Appointment) with Delana Meyer, Dolores Lory, MD.    Past Medical History:  Diagnosis Date  . Allergies    hay fever  . Arthritis   . GERD (gastroesophageal reflux disease)   . Hyperlipemia   . Hypertension   . Phlebitis    TIA    Past Surgical History:  Procedure Laterality Date  . BREAST BIOPSY  2014  . BREAST LUMPECTOMY  2014   left side  . MASTECTOMY Left May 9th 2017  . RHINOPLASTY  1956  . TONSILLECTOMY  1939  . TOTAL HIP ARTHROPLASTY Right 2015    Social History Social History   Tobacco Use  . Smoking status: Former Research scientist (life sciences)  . Smokeless tobacco: Never Used  Substance Use Topics  . Alcohol use: Yes  . Drug use: Never    Family History No family history on file. No family history of bleeding/clotting disorders, porphyria or autoimmune disease   Allergies  Allergen Reactions  . Influenza Vaccines Shortness Of Breath     REVIEW OF SYSTEMS (Negative unless  checked)  Constitutional: [] Weight loss  [] Fever  [] Chills Cardiac: [] Chest pain   [] Chest pressure   [] Palpitations   [] Shortness of breath when laying flat   [] Shortness of breath with exertion. Vascular:  [] Pain in legs with walking   [] Pain in legs at rest  [] History of DVT   [] Phlebitis   [] Swelling in legs   [] Varicose veins   [] Non-healing ulcers Pulmonary:   [] Uses home oxygen   [] Productive cough   [] Hemoptysis   [] Wheeze  [] COPD   [] Asthma Neurologic:  [x] Dizziness   [] Seizures   [] History of stroke   [] History of TIA  [] Aphasia   [] Vissual changes   [] Weakness or numbness in arm   [x] Weakness or numbness in leg Musculoskeletal:   [] Joint swelling   [] Joint pain   [] Low back pain Hematologic:  [] Easy bruising  [] Easy bleeding   [] Hypercoagulable state   [] Anemic Gastrointestinal:  [] Diarrhea   [] Vomiting  [] Gastroesophageal reflux/heartburn   [] Difficulty swallowing. Genitourinary:  [] Chronic kidney disease   [] Difficult urination  [] Frequent urination   [] Blood in urine Skin:  [] Rashes   [] Ulcers  Psychological:  [] History of anxiety   []  History of major depression.  Physical Examination  There were no vitals filed for this visit. There is no height or weight on file to calculate BMI. Gen: WD/WN, NAD Head: [place]/AT, No temporalis wasting.  Ear/Nose/Throat: Hearing grossly intact, nares w/o erythema  or drainage, poor dentition Eyes: PER, EOMI, sclera nonicteric.  Neck: Supple, no masses.  No bruit or JVD.  Pulmonary:  Good air movement, clear to auscultation bilaterally, no use of accessory muscles.  Cardiac: RRR, normal S1, S2, no Murmurs. Vascular: no left carotid bruit noted Vessel Right Left  Radial Palpable Palpable  Carotid Palpable Palpable  Gastrointestinal: soft, non-distended. No guarding/no peritoneal signs.  Musculoskeletal: M/S 5/5 throughout.  No deformity or atrophy.  Neurologic: CN 2-12 intact. Pain and light touch intact in extremities.  Symmetrical.  Speech is  fluent. Motor exam as listed above. Psychiatric: Judgment intact, Mood & affect appropriate for pt's clinical situation. Dermatologic: No rashes or ulcers noted.  No changes consistent with cellulitis. Lymph : No Cervical lymphadenopathy, no lichenification or skin changes of chronic lymphedema.  CBC Lab Results  Component Value Date   WBC 7.2 03/12/2018   HGB 12.5 03/12/2018   HCT 36.7 03/12/2018   MCV 92.5 03/12/2018   PLT 207.0 03/12/2018    BMET    Component Value Date/Time   NA 138 03/12/2018 0755   K 4.4 03/12/2018 0755   CL 102 03/12/2018 0755   CO2 28 03/12/2018 0755   GLUCOSE 126 (H) 03/12/2018 0755   BUN 18 03/12/2018 0755   CREATININE 0.98 03/12/2018 0755   CALCIUM 9.8 03/12/2018 0755   CrCl cannot be calculated (Patient's most recent lab result is older than the maximum 21 days allowed.).  COAG No results found for: INR, PROTIME  Radiology US Carotid Duplex Bilateral  Result Date: 10/11/2018 CLINICAL DATA:  History of TIA, syncopal episode and hyperlipidemia. EXAM: BILATERAL CAROTID DUPLEX ULTRASOUND TECHNIQUE: Pearline Cables scale imaging, color Doppler and duplex ultrasound were performed of bilateral carotid and vertebral arteries in the neck. COMPARISON:  None. FINDINGS: Criteria: Quantification of carotid stenosis is based on velocity parameters that correlate the residual internal carotid diameter with NASCET-based stenosis levels, using the diameter of the distal internal carotid lumen as the denominator for stenosis measurement. The following velocity measurements were obtained: RIGHT ICA: 102/22 cm/sec CCA: 73/22 cm/sec SYSTOLIC ICA/CCA RATIO:  1.2 ECA: 172 cm/sec LEFT ICA: 316/44 cm/sec CCA: 02/54 cm/sec SYSTOLIC ICA/CCA RATIO:  4.7 ECA: 160 cm/sec RIGHT CAROTID ARTERY: There is a moderate amount of eccentric echogenic plaque with the distal aspect the right common carotid artery (image 11). There is a moderate to large amount of eccentric echogenic plaque involving the  origin and proximal aspects the right internal carotid artery (image 22, which does not result in elevated peak systolic velocities within the interrogated of course the right internal carotid artery to suggest a hemodynamically significant stenosis. RIGHT VERTEBRAL ARTERY:  Antegrade Flow LEFT CAROTID ARTERY: There is a large amount of eccentric echogenic plaque within the left carotid bulb (image 48), which extends to involve the origin and proximal aspect of the left internal carotid artery (image 55) resulting in a sonographic string sign and subtotal occlusion. Elevated velocities are seen throughout the interrogated course the left internal carotid artery. Greatest acquired peak systolic velocity within the proximal left ICA measures 316 centimeters/second (image 57). LEFT VERTEBRAL ARTERY:  Antegrade Flow IMPRESSION: 1. Large amount of left-sided atherosclerotic plaque results in a sonographic string sign, subtotal occlusion in markedly elevated peak systolic velocities within the left internal carotid artery. Further evaluation with CTA could performed as clinically indicated. 2. Moderate to large amount of right-sided atherosclerotic plaque, not resulting in a hemodynamically significant stenosis. This was made a call report. Electronically Signed   By: Jenny Reichmann  Watts M.D.   On: 10/11/2018 15:32    Assessment/Plan 1. Carotid stenosis, symptomatic w/o infarct, left The patient remains asymptomatic with respect to the carotid stenosis.  However, the patient has now progressed and has a lesion the is >70%.  Patient should undergo CT angiography of the carotid arteries to define the degree of stenosis of the internal carotid arteries bilaterally and the anatomic suitability for surgery vs. intervention.  If the patient does indeed need surgery cardiac clearance will be required, once cleared the patient will be scheduled for surgery.  The risks, benefits and alternative therapies were reviewed in detail  with the patient.  All questions were answered.  The patient agrees to proceed with imaging.  Continue antiplatelet therapy as prescribed. Continue management of CAD, HTN and Hyperlipidemia. Healthy heart diet, encouraged exercise at least 4 times per week.   - CT ANGIO NECK W OR WO CONTRAST; Future  2. Essential hypertension Continue antihypertensive medications as already ordered, these medications have been reviewed and there are no changes at this time.   3. Chronic obstructive pulmonary disease, unspecified COPD type ([place]) Continue pulmonary medications and aerosols as already ordered, these medications have been reviewed and there are no changes at this time.  4. Hyperlipidemia, unspecified hyperlipidemia type Continue statin as ordered and reviewed, no changes at this time     [name], MD  10/17/2018 1:28 PM

## 2018-10-28 DIAGNOSIS — H26491 Other secondary cataract, right eye: Secondary | ICD-10-CM | POA: Diagnosis not present

## 2018-10-28 DIAGNOSIS — Z961 Presence of intraocular lens: Secondary | ICD-10-CM | POA: Diagnosis not present

## 2018-10-28 DIAGNOSIS — Z9841 Cataract extraction status, right eye: Secondary | ICD-10-CM | POA: Diagnosis not present

## 2018-10-28 DIAGNOSIS — H04123 Dry eye syndrome of bilateral lacrimal glands: Secondary | ICD-10-CM | POA: Diagnosis not present

## 2018-10-28 DIAGNOSIS — H26492 Other secondary cataract, left eye: Secondary | ICD-10-CM | POA: Diagnosis not present

## 2018-10-31 ENCOUNTER — Other Ambulatory Visit: Payer: Self-pay

## 2018-10-31 ENCOUNTER — Ambulatory Visit
Admission: RE | Admit: 2018-10-31 | Discharge: 2018-10-31 | Disposition: A | Discharge status: Home / Self Care | Payer: Medicare Other | Source: Ambulatory Visit | Attending: Vascular Surgery | Admitting: Vascular Surgery

## 2018-10-31 DIAGNOSIS — I6522 Occlusion and stenosis of left carotid artery: Secondary | ICD-10-CM | POA: Insufficient documentation

## 2018-10-31 DIAGNOSIS — I6523 Occlusion and stenosis of bilateral carotid arteries: Secondary | ICD-10-CM | POA: Diagnosis not present

## 2018-10-31 LAB — POCT I-STAT CREATININE: Creatinine, Ser: 1.1 mg/dL — ABNORMAL HIGH (ref 0.44–1.00)

## 2018-10-31 IMAGING — CT CT ANGIOGRAPHY NECK
2 of 5 series · 10 of 33 positions shown · IV contrast (omnipaque)
Comparison: Report for carotid duplex performed [DATE].

CLINICAL DATA: Carotid stenosis, known, follow-up.

EXAM:
CT ANGIOGRAPHY NECK
TECHNIQUE: Multidetector CT imaging of the neck was performed using the
standard protocol during bolus administration of intravenous
contrast. Multiplanar CT image reconstructions and MIPs were
obtained to evaluate the vascular anatomy. Carotid stenosis
measurements (when applicable) are obtained utilizing NASCET
criteria, using the distal internal carotid diameter as the
denominator.
CONTRAST:  75mL OMNIPAQUE IOHEXOL 350 MG/ML SOLN

[Series 4: ax thin mips cta neck · axial · 0.46mm/px · z∈[-732,-557]mm · 7 of 231 slices shown]
[im 29/231  soft-tissue]
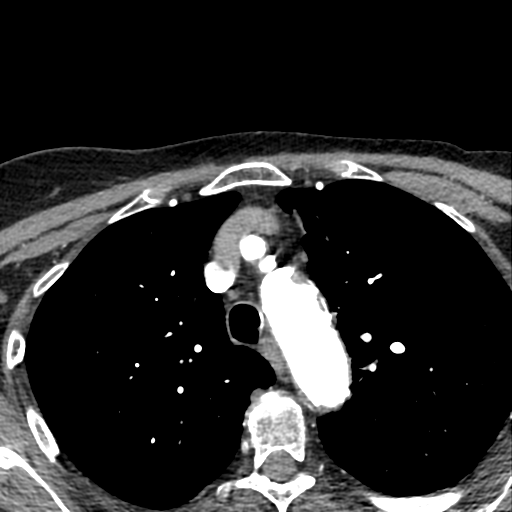
[im 58/231  bone]
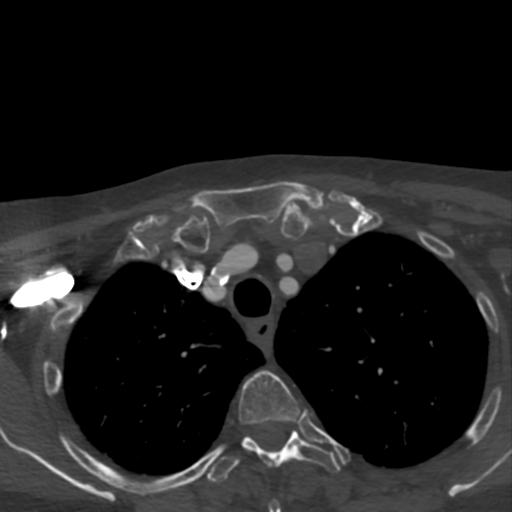
[im 87/231  soft-tissue]
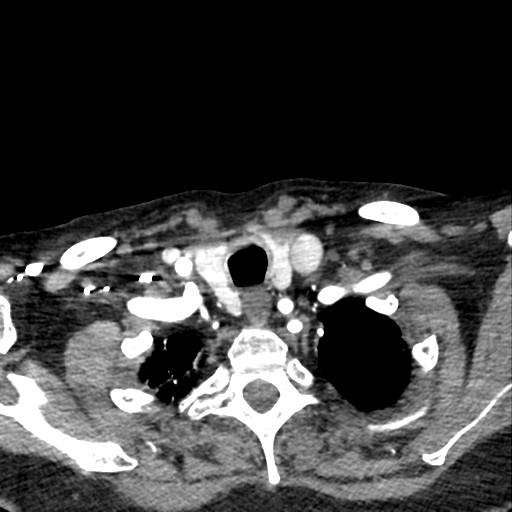
[im 116/231  bone]
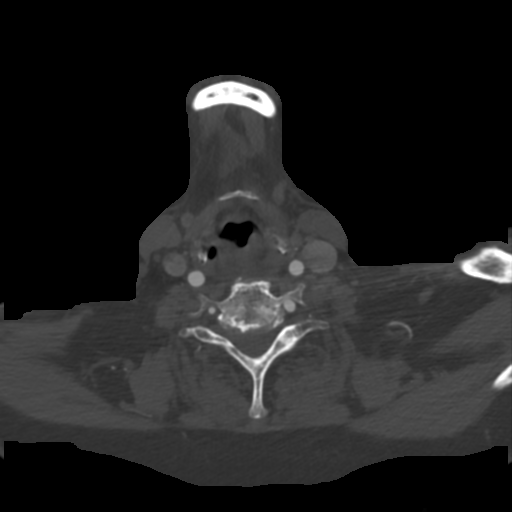
[im 144/231  soft-tissue]
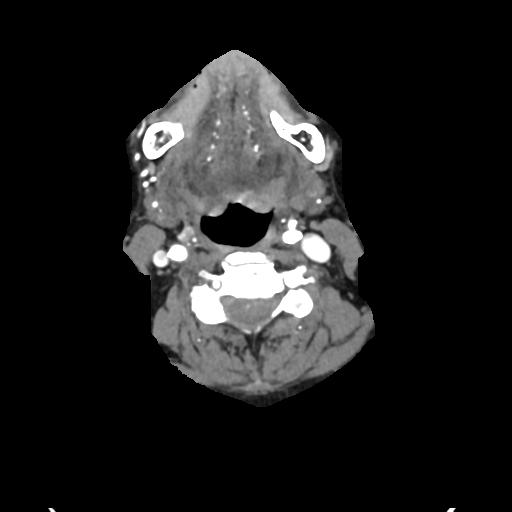
[im 173/231  bone]
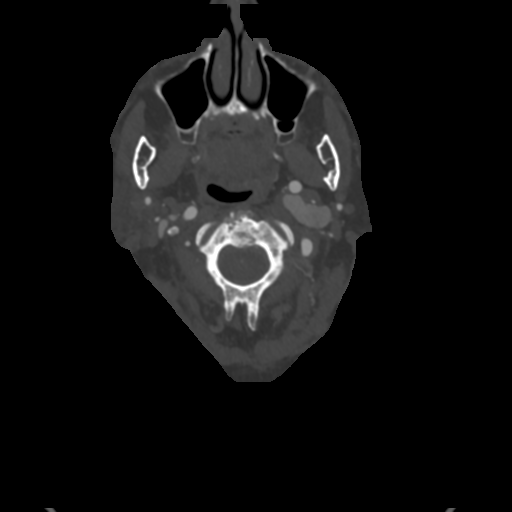
[im 202/231  soft-tissue]
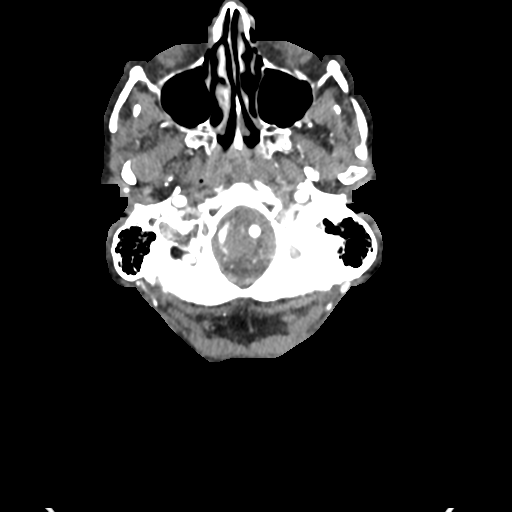

[Series 11: cta neck cta neck · axial · 0.46mm/px · z∈[-703,-587]mm · 3 of 115 slices shown]
[im 29/115  soft-tissue]
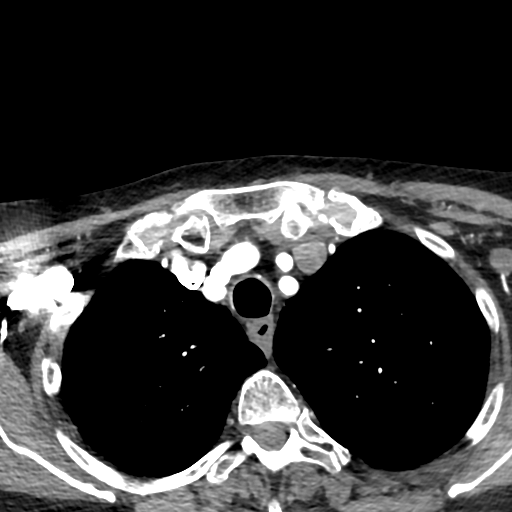
[im 58/115  soft-tissue]
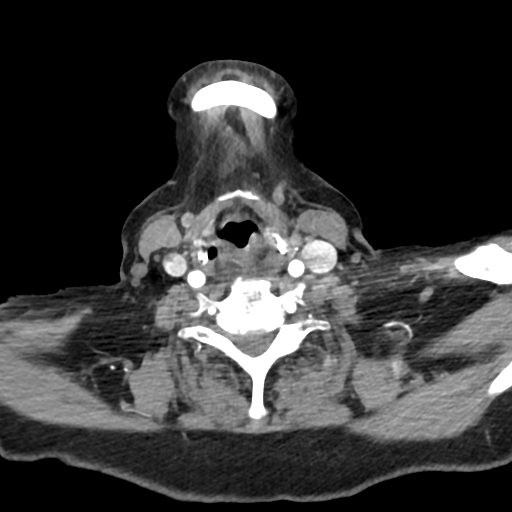
[im 86/115  soft-tissue]
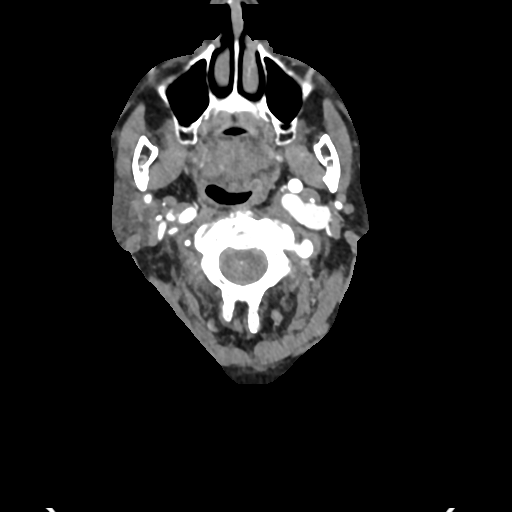

[10 of 33 positions shown; findings below may reference images not displayed]

FINDINGS: Aortic arch: Ectatic ascending thoracic aorta measuring 3.4 cm in
diameter. Prominent soft and calcified atherosclerotic plaque within
the visualized aortic arch and major branch vessels. No high-grade
stenosis of the major branch vessel origins.

Right carotid system: Scattered atherosclerotic plaque, including
prominent calcified plaque at the right carotid bifurcation and
within the proximal cervical right ICA. Calcified plaque results in
less than 50% narrowing of the proximal cervical right ICA.

Left carotid system: Scattered atherosclerotic plaque. This includes
prominent calcified plaque at the left carotid bifurcation and
within the proximal cervical left ICA. Prominent calcified plaque
results in significant narrowing of the distal left common carotid
artery, left ICA origin and proximal cervical left ICA. The highest
grade stenosis is present at the very origin of the left ICA with
stenosis of at least 70%.

Atherosclerotic calcification within the visualized intracranial
carotid siphons with multifocal mild-to-moderate stenosis

Vertebral arteries: Calcified plaque at the right vertebral artery
ostium without significant ostial stenosis. The right vertebral
artery is patent without significant stenosis (50% or greater). The
left vertebral artery is significantly dominant. Atherosclerotic
plaque at the left vertebral artery ostium without significant
ostial stenosis. The left vertebral artery is patent without
significant stenosis.

Skeleton: Cervical spondylosis with multilevel posterior disc
osteophyte complexes, bony spinal canal and neural foraminal
narrowing. C2-C3 grade 1 anterolisthesis. Nonspecific well
demarcated lucency within the inferior aspect of the dens without
aggressive features.

Other neck: No soft tissue neck mass or pathologically enlarged
cervical chain lymph nodes

Upper chest: Prominent centrilobular pulmonary emphysema.
IMPRESSION: Prominent atherosclerotic plaque within the aortic arch and major
branch vessels.

Prominent calcified plaque at the left carotid bifurcation and
within the proximal left ICA. Resultant significant narrowing of the
distal left common carotid artery and proximal left cervical ICA.
The highest grade stenosis is within the proximal left ICA,
measuring at least 70%.

Prominent calcified plaque results in less than 50% narrowing of the
proximal right cervical ICA.

Atherosclerotic calcification within the visualized intracranial
internal carotid siphons with multifocal mild-to-moderate stenoses.

Ectatic ascending thoracic aorta measuring 3.4 cm in diameter.

Pulmonary emphysema.

## 2018-10-31 MED ORDER — IOHEXOL 350 MG/ML SOLN
75.0000 mL | Freq: Once | INTRAVENOUS | Status: AC | PRN
  Administered 2018-10-31: 75 mL via INTRAVENOUS

## 2018-11-11 ENCOUNTER — Telehealth (INDEPENDENT_AMBULATORY_CARE_PROVIDER_SITE_OTHER): Payer: Self-pay | Admitting: Vascular Surgery

## 2018-11-11 NOTE — Telephone Encounter (Signed)
Patient had CT of neck done 10/31/18. Patient calling requesting results. Patient does not have any future apt with [name]. Please advise. AS, CMA

## 2018-11-11 NOTE — Telephone Encounter (Signed)
She needs to be scheduled an appointment with Dr. Delana Meyer to discuss results of CT

## 2018-11-11 NOTE — Telephone Encounter (Signed)
Can you please contact patient to schedule apt and advised below. AS, CMA

## 2018-11-14 ENCOUNTER — Other Ambulatory Visit: Payer: Self-pay

## 2018-11-14 ENCOUNTER — Telehealth: Payer: Self-pay

## 2018-11-14 ENCOUNTER — Ambulatory Visit (INDEPENDENT_AMBULATORY_CARE_PROVIDER_SITE_OTHER): Payer: Medicare Other | Admitting: Vascular Surgery

## 2018-11-14 ENCOUNTER — Encounter (INDEPENDENT_AMBULATORY_CARE_PROVIDER_SITE_OTHER): Payer: Self-pay | Admitting: Vascular Surgery

## 2018-11-14 VITALS — BP 166/95 | HR 91 | Resp 16 | Wt 148.0 lb

## 2018-11-14 DIAGNOSIS — I6522 Occlusion and stenosis of left carotid artery: Secondary | ICD-10-CM

## 2018-11-14 DIAGNOSIS — I10 Essential (primary) hypertension: Secondary | ICD-10-CM

## 2018-11-14 DIAGNOSIS — E785 Hyperlipidemia, unspecified: Secondary | ICD-10-CM | POA: Diagnosis not present

## 2018-11-14 DIAGNOSIS — J449 Chronic obstructive pulmonary disease, unspecified: Secondary | ICD-10-CM | POA: Diagnosis not present

## 2018-11-14 NOTE — Telephone Encounter (Signed)
Call Dr schneir's office and let him know that I'm not in the office today and can speak with him tomorrow . Please ensure not urgent . He can also send me a staff message if not urgent   Please call pt and ensure not urgent . I am not sure what is meant by occlusion

## 2018-11-14 NOTE — Telephone Encounter (Signed)
I left detailed message in nurse kine at Dr. Nino Parsley office asking for someone to call back. My direct number provided.

## 2018-11-14 NOTE — Progress Notes (Signed)
MRN : [id number]  Natalie [name] is a 85 y.o. (12/23/1929) female who presents with chief complaint of No chief complaint on file. Marland Kitchen  History of Present Illness:   The patient is seen for follow up evaluation of carotid stenosis status post CT angiogram. CT scan was done 821/2020. Patient reports that the test went well with no problems or complications.   The patient denies interval amaurosis fugax. There is no recent or interval TIA symptoms or focal motor deficits. There is no prior documented CVA.  The patient is taking enteric-coated aspirin 81 mg daily.  There is no history of migraine headaches. There is no history of seizures.  The patient has a history of coronary artery disease, no recent episodes of angina or shortness of breath. The patient denies PAD or claudication symptoms. There is a history of hyperlipidemia which is being treated with a statin.    CT angiogram is reviewed by me personally and shows [id number] LICA associated with a high grade kink.   No outpatient medications have been marked as taking for the 11/14/18 encounter (Appointment) with Delana Meyer, Dolores Lory, MD.    Past Medical History:  Diagnosis Date   Allergies    hay fever   Arthritis    Breast cancer, left ([place]) 2015   Left total mastectomy   GERD (gastroesophageal reflux disease)    Hyperlipemia    Hypertension    Phlebitis    TIA    Past Surgical History:  Procedure Laterality Date   BREAST BIOPSY  2014   BREAST LUMPECTOMY  2014   left side   MASTECTOMY Left May 9th 2017   [place]   TOTAL HIP ARTHROPLASTY Right 2015    Social History Social History   Tobacco Use   Smoking status: Former Smoker   Smokeless tobacco: Never Used  Substance Use Topics   Alcohol use: Yes   Drug use: Never    Family History Family History  Problem Relation Age of Onset   Breast cancer Mother    Lung cancer Father     Allergies  Allergen  Reactions   Influenza Vaccines Shortness Of Breath     REVIEW OF SYSTEMS (Negative unless checked)  Constitutional: [] Weight loss  [] Fever  [] Chills Cardiac: [] Chest pain   [] Chest pressure   [] Palpitations   [] Shortness of breath when laying flat   [] Shortness of breath with exertion. Vascular:  [] Pain in legs with walking   [] Pain in legs at rest  [] History of DVT   [] Phlebitis   [] Swelling in legs   [] Varicose veins   [] Non-healing ulcers Pulmonary:   [] Uses home oxygen   [] Productive cough   [] Hemoptysis   [] Wheeze  [] COPD   [] Asthma Neurologic:  [] Dizziness   [] Seizures   [] History of stroke   [] History of TIA  [] Aphasia   [] Vissual changes   [] Weakness or numbness in arm   [] Weakness or numbness in leg Musculoskeletal:   [] Joint swelling   [] Joint pain   [] Low back pain Hematologic:  [] Easy bruising  [] Easy bleeding   [] Hypercoagulable state   [] Anemic Gastrointestinal:  [] Diarrhea   [] Vomiting  [] Gastroesophageal reflux/heartburn   [] Difficulty swallowing. Genitourinary:  [] Chronic kidney disease   [] Difficult urination  [] Frequent urination   [] Blood in urine Skin:  [] Rashes   [] Ulcers  Psychological:  [] History of anxiety   []  History of major depression.  Physical Examination  There were no vitals filed for this visit.  There is no height or weight on file to calculate BMI. Gen: WD/WN, NAD Head: [place]/AT, No temporalis wasting.  Ear/Nose/Throat: Hearing grossly intact, nares w/o erythema or drainage Eyes: PER, EOMI, sclera nonicteric.  Neck: Supple, no large masses.   Pulmonary:  Good air movement, no audible wheezing bilaterally, no use of accessory muscles.  Cardiac: RRR, no JVD Vascular: left carotid bruit Vessel Right Left  Radial Palpable Palpable  Brachial Palpable Palpable  Carotid Palpable Palpable  Gastrointestinal: Non-distended. No guarding/no peritoneal signs.  Musculoskeletal: M/S 5/5 throughout.  No deformity or atrophy.  Neurologic: CN 2-12 intact.  Symmetrical.  Speech is fluent. Motor exam as listed above. Psychiatric: Judgment intact, Mood & affect appropriate for pt's clinical situation. Dermatologic: No rashes or ulcers noted.  No changes consistent with cellulitis. Lymph : No lichenification or skin changes of chronic lymphedema.  CBC Lab Results  Component Value Date   WBC 7.2 03/12/2018   HGB 12.5 03/12/2018   HCT 36.7 03/12/2018   MCV 92.5 03/12/2018   PLT 207.0 03/12/2018    BMET    Component Value Date/Time   NA 138 03/12/2018 0755   K 4.4 03/12/2018 0755   CL 102 03/12/2018 0755   CO2 28 03/12/2018 0755   GLUCOSE 126 (H) 03/12/2018 0755   BUN 18 03/12/2018 0755   CREATININE 1.10 (H) 10/31/2018 1513   CALCIUM 9.8 03/12/2018 0755   CrCl cannot be calculated (Unknown ideal weight.).  COAG No results found for: INR, PROTIME  Radiology Ct Angio Neck W Or Wo Contrast  Result Date: 11/01/2018 CLINICAL DATA:  Carotid stenosis, known, follow-up. EXAM: CT ANGIOGRAPHY NECK TECHNIQUE: Multidetector CT imaging of the neck was performed using the standard protocol during bolus administration of intravenous contrast. Multiplanar CT image reconstructions and MIPs were obtained to evaluate the vascular anatomy. Carotid stenosis measurements (when applicable) are obtained utilizing NASCET criteria, using the distal internal carotid diameter as the denominator. CONTRAST:  40mL OMNIPAQUE IOHEXOL 350 MG/ML SOLN COMPARISON:  Report for carotid duplex performed 10/11/2018. FINDINGS: Aortic arch: Ectatic ascending thoracic aorta measuring 3.4 cm in diameter. Prominent soft and calcified atherosclerotic plaque within the visualized aortic arch and major branch vessels. No high-grade stenosis of the major branch vessel origins. Right carotid system: Scattered atherosclerotic plaque, including prominent calcified plaque at the right carotid bifurcation and within the proximal cervical right ICA. Calcified plaque results in less than 50%  narrowing of the proximal cervical right ICA. Left carotid system: Scattered atherosclerotic plaque. This includes prominent calcified plaque at the left carotid bifurcation and within the proximal cervical left ICA. Prominent calcified plaque results in significant narrowing of the distal left common carotid artery, left ICA origin and proximal cervical left ICA. The highest grade stenosis is present at the very origin of the left ICA with stenosis of at least 70%. Atherosclerotic calcification within the visualized intracranial carotid siphons with multifocal mild-to-moderate stenosis Vertebral arteries: Calcified plaque at the right vertebral artery ostium without significant ostial stenosis. The right vertebral artery is patent without significant stenosis (50% or greater). The left vertebral artery is significantly dominant. Atherosclerotic plaque at the left vertebral artery ostium without significant ostial stenosis. The left vertebral artery is patent without significant stenosis. Skeleton: Cervical spondylosis with multilevel posterior disc osteophyte complexes, bony spinal canal and neural foraminal narrowing. C2-C3 grade 1 anterolisthesis. Nonspecific well demarcated lucency within the inferior aspect of the dens without aggressive features. Other neck: No soft tissue neck mass or pathologically enlarged cervical chain lymph nodes Upper  chest: Prominent centrilobular pulmonary emphysema. IMPRESSION: Prominent atherosclerotic plaque within the aortic arch and major branch vessels. Prominent calcified plaque at the left carotid bifurcation and within the proximal left ICA. Resultant significant narrowing of the distal left common carotid artery and proximal left cervical ICA. The highest grade stenosis is within the proximal left ICA, measuring at least 70%. Prominent calcified plaque results in less than 50% narrowing of the proximal right cervical ICA. Atherosclerotic calcification within the visualized  intracranial internal carotid siphons with multifocal mild-to-moderate stenoses. Ectatic ascending thoracic aorta measuring 3.4 cm in diameter. Pulmonary emphysema. Electronically Signed   By: Kellie Simmering   On: 11/01/2018 15:49     Assessment/Plan 1. Carotid stenosis, symptomatic w/o infarct, left Recommend:  The patient remains asymptomatic with respect to the carotid stenosis.  However, the patient has now progressed and has a lesion the is >75%.  Patient's CT angiography of the carotid arteries confirms >75% left ICA stenosis.  The anatomical considerations support surgery over stenting.  This was discussed in detail with the patient.  The patient does indeed need surgery, therefore, cardiac clearance will be arranged. Once cleared the patient will be scheduled for surgery.  The risks, benefits and alternative therapies were reviewed in detail with the patient.  All questions were answered.  The patient agrees to proceed with surgery of the left carotid artery.  Continue antiplatelet therapy as prescribed. Continue management of CAD, HTN and Hyperlipidemia. Healthy heart diet, encouraged exercise at least 4 times per week.   - Ambulatory referral to Cardiology  2. Essential hypertension Continue antihypertensive medications as already ordered, these medications have been reviewed and there are no changes at this time.   3. Chronic obstructive pulmonary disease, unspecified COPD type ([place]) Continue pulmonary medications and aerosols as already ordered, these medications have been reviewed and there are no changes at this time.    4. Hyperlipidemia, unspecified hyperlipidemia type Continue statin as ordered and reviewed, no changes at this time   [name], MD  11/14/2018 1:18 PM

## 2018-11-14 NOTE — Telephone Encounter (Signed)
Copied from [place] [id number]. Topic: General - Inquiry >> Nov 14, 2018  2:12 PM Richardo Priest, [place] wrote: Reason for CRM: Patient called in stating that she was [facility] with Dr.Schnier, and that she has an occlusion. Patient stated her vascular MD would like PCP to call office and see what can be done for patient. Please advise.

## 2018-11-14 NOTE — Telephone Encounter (Signed)
LMTCB

## 2018-11-15 NOTE — Telephone Encounter (Signed)
I called patient & let her know that [name] was corresponding with Dr. Ronalee Belts. He was trying to get patient in to see Dr. Rockey Situ. Patient was agreeable to seeing [name] & is scheduled 11/22/18.

## 2018-11-20 ENCOUNTER — Other Ambulatory Visit: Payer: Self-pay

## 2018-11-22 ENCOUNTER — Ambulatory Visit (INDEPENDENT_AMBULATORY_CARE_PROVIDER_SITE_OTHER): Payer: Medicare Other | Admitting: Family

## 2018-11-22 ENCOUNTER — Encounter: Payer: Self-pay | Admitting: Family

## 2018-11-22 ENCOUNTER — Other Ambulatory Visit: Payer: Self-pay

## 2018-11-22 DIAGNOSIS — I6522 Occlusion and stenosis of left carotid artery: Secondary | ICD-10-CM

## 2018-11-22 DIAGNOSIS — I10 Essential (primary) hypertension: Secondary | ICD-10-CM | POA: Diagnosis not present

## 2018-11-22 NOTE — Assessment & Plan Note (Addendum)
Appears asymptomatic .pending cardiology clearance and following with vascular for l;eft carotid surgery.  Will follow

## 2018-11-22 NOTE — Assessment & Plan Note (Signed)
At goal, continue current regimen 

## 2018-11-22 NOTE — Patient Instructions (Signed)
Pleased with blood pressure and that chest wall pain resolved.   Please continue to monitor both and let us know if you need anything ahead of your appointment/surgery with Dr Ronalee Belts.   Stay safe!

## 2018-11-22 NOTE — Progress Notes (Signed)
Subjective:    Patient ID: Natalie [name], female    DOB: September 08, 1929, 85 y.o.   MRN: [id number]  CC: Natalie [name] is a 85 y.o. female who presents today for follow up.   HPI: Feels well today, no complaints.  She states that her left-sided chest pain has not recurred. She started taking the lisinopril and Norvasc at [name] and takes her Lopressor in the morning ; she feels like her blood pressures have improved.  At home she has been seeing 130/80, 132/70, and120/70.  She denies any dizziness, chest pain, headache or vision changes.   Following with vascular with plans to proceed with carotid surgery  Carotid stenosis > 75% on left - Dr Natalie [name]; she will need cardiac clearance prior to surgery ( Dr Natalie [name] 12/03/18)   HISTORY:  Past Medical History:  Diagnosis Date  . Allergies    hay fever  . Arthritis   . Breast cancer, left ([place]) 2015   Left total mastectomy  . GERD (gastroesophageal reflux disease)   . Hyperlipemia   . Hypertension   . Phlebitis    TIA   Past Surgical History:  Procedure Laterality Date  . BREAST BIOPSY  2014  . BREAST LUMPECTOMY  2014   left side  . MASTECTOMY Left May 9th 2017  . RHINOPLASTY  1956  . TONSILLECTOMY  1939  . TOTAL HIP ARTHROPLASTY Right 2015   Family History  Problem Relation Age of Onset  . Breast cancer Mother   . Lung cancer Father     Allergies: Influenza vaccines Current Outpatient Medications on File Prior to Visit  Medication Sig Dispense Refill  . amLODipine (NORVASC) 10 MG tablet TAKE 1 TABLET BY MOUTH EVERY DAY 90 tablet 0  . atorvastatin (LIPITOR) 20 MG tablet TAKE 1 TABLET BY MOUTH EVERY DAY 90 tablet 0  . b complex vitamins tablet Take 1 tablet by mouth daily.    . Calcium Citrate-Vitamin D 250-200 MG-UNIT TABS Take 250 mg by mouth every morning.    . cholecalciferol (VITAMIN D3) 25 MCG (1000 UT) tablet Take 2,000 Units by mouth daily.    . clopidogrel (PLAVIX) 75 MG tablet TAKE 1 TABLET BY MOUTH EVERY DAY 90  tablet 3  . lisinopril (ZESTRIL) 5 MG tablet TAKE ONE TABLET (5 MG DOSE) BY MOUTH DAILY. 90 tablet 0  . metoprolol tartrate (LOPRESSOR) 50 MG tablet TAKE 1 TABLET BY MOUTH EVERY DAY 90 tablet 0  . Tiotropium Bromide Monohydrate 2.5 MCG/ACT AERS Inhale 2 puffs into the lungs 2 (two) times daily.    . traZODone (DESYREL) 50 MG tablet Take 0.5 tablets (25 mg total) by mouth at bedtime. 90 tablet 1   No current facility-administered medications on file prior to visit.     Social History   Tobacco Use  . Smoking status: Former Research scientist (life sciences)  . Smokeless tobacco: Never Used  Substance Use Topics  . Alcohol use: Yes  . Drug use: Never    Review of Systems  Constitutional: Negative for chills and fever.  Eyes: Negative for visual disturbance.  Respiratory: Negative for cough.   Cardiovascular: Negative for chest pain and palpitations.  Gastrointestinal: Negative for nausea and vomiting.  Neurological: Negative for dizziness.      Objective:    BP 130/60   Pulse 67   Temp 98 F (36.7 C)   Wt 147 lb 6.4 oz (66.9 kg)   SpO2 96%   BMI 24.53 kg/m  BP Readings from Last 3 Encounters:  11/22/18 130/60  11/14/18 (!) 166/95  10/17/18 (!) 161/69   Wt Readings from Last 3 Encounters:  11/22/18 147 lb 6.4 oz (66.9 kg)  11/14/18 148 lb (67.1 kg)  10/17/18 148 lb (67.1 kg)    Physical Exam Vitals signs reviewed.  Constitutional:      Appearance: She is well-developed.  Eyes:     Conjunctiva/sclera: Conjunctivae normal.  Cardiovascular:     Rate and Rhythm: Normal rate and regular rhythm.     Pulses: Normal pulses.     Heart sounds: Normal heart sounds.  Pulmonary:     Effort: Pulmonary effort is normal.     Breath sounds: Normal breath sounds. No wheezing, rhonchi or rales.  Skin:    General: Skin is warm and dry.  Neurological:     Mental Status: She is alert.  Psychiatric:        Speech: Speech normal.        Behavior: Behavior normal.        Thought Content: Thought content  normal.        Assessment & Plan:   Problem List Items Addressed This Visit      Cardiovascular and Mediastinum   Essential hypertension    At goal, continue current regimen      Carotid atherosclerosis    Appears asymptomatic .pending cardiology clearance and following with vascular for l;eft carotid surgery.  Will follow          I am having Natalie [name] maintain her b complex vitamins, Calcium Citrate-Vitamin D, Tiotropium Bromide Monohydrate, cholecalciferol, clopidogrel, atorvastatin, traZODone, lisinopril, amLODipine, and metoprolol tartrate.   No orders of the defined types were placed in this encounter.   Return precautions given.   Risks, benefits, and alternatives of the medications and treatment plan prescribed today were discussed, and patient expressed understanding.   Education regarding symptom management and diagnosis given to patient on AVS.  Continue to follow with [name], FNP for routine health maintenance.   Natalie [name] and I agreed with plan.   Mable Paris, FNP

## 2018-12-03 ENCOUNTER — Ambulatory Visit: Payer: Medicare Other | Admitting: Cardiology

## 2018-12-03 ENCOUNTER — Other Ambulatory Visit: Payer: Self-pay

## 2018-12-03 ENCOUNTER — Encounter: Payer: Self-pay | Admitting: Cardiology

## 2018-12-03 VITALS — BP 130/72 | HR 69 | Ht 65.0 in | Wt 145.0 lb

## 2018-12-03 DIAGNOSIS — R011 Cardiac murmur, unspecified: Secondary | ICD-10-CM | POA: Diagnosis not present

## 2018-12-03 DIAGNOSIS — I10 Essential (primary) hypertension: Secondary | ICD-10-CM | POA: Diagnosis not present

## 2018-12-03 DIAGNOSIS — Z01810 Encounter for preprocedural cardiovascular examination: Secondary | ICD-10-CM | POA: Diagnosis not present

## 2018-12-03 DIAGNOSIS — I6522 Occlusion and stenosis of left carotid artery: Secondary | ICD-10-CM

## 2018-12-03 NOTE — Progress Notes (Signed)
Cardiology Office Note:    Date:  12/03/2018   ID:  Natalie [name], Natalie [name] 17-Mar-1929, MRN [id number]  PCP:  [name], FNP  Cardiologist:  No primary care provider on file.  Electrophysiologist:  None   Referring MD: Katha Cabal, MD   Chief Complaint  Patient presents with  . New Patient (Initial Visit)    Carotid stenosis, symptomatic    History of Present Illness:    Natalie [name] is a 85 y.o. female with a hx of hypertension, TIA, carotid artery stenosis who presents for pre surgical evaluation prior to undergoing carotid endarterectomy.  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  At the time she underwent carotid artery imaging which revealed stenosis.  She has been followed since, with repeat imaging via a neck CT angio revealing severe stenosis in the left carotid artery.  Surgery is being planned.  She denies any cardiac disease history such as MI, or heart failure.  She walks at least 1 mile every day, denies chest pain or shortness of breath.  Also denies edema.  She takes Plavix 75 mg daily.  Her blood pressure and cholesterol are currently well managed with medicines.  Past Medical History:  Diagnosis Date  . Allergies    hay fever  . Arthritis   . Breast cancer, left ([place]) 2015   Left total mastectomy  . GERD (gastroesophageal reflux disease)   . Hyperlipemia   . Hypertension   . Phlebitis    TIA    Past Surgical History:  Procedure Laterality Date  . BREAST BIOPSY  2014  . BREAST LUMPECTOMY  2014   left side  . MASTECTOMY Left May 9th 2017  . RHINOPLASTY  1956  . TONSILLECTOMY  1939  . TOTAL HIP ARTHROPLASTY Right 2015    Current Medications: Current Meds  Medication Sig  . amLODipine (NORVASC) 10 MG tablet TAKE 1 TABLET BY MOUTH EVERY DAY  . atorvastatin (LIPITOR) 20 MG tablet TAKE 1 TABLET BY MOUTH EVERY DAY  . Calcium Citrate-Vitamin D 250-200 MG-UNIT TABS Take 250 mg by mouth every  morning.  . cholecalciferol (VITAMIN D3) 25 MCG (1000 UT) tablet Take 2,000 Units by mouth daily.  . clopidogrel (PLAVIX) 75 MG tablet TAKE 1 TABLET BY MOUTH EVERY DAY  . fluticasone (FLONASE) 50 MCG/ACT nasal spray Place 2 sprays into both nostrils daily.  Marland Kitchen lisinopril (ZESTRIL) 5 MG tablet TAKE ONE TABLET (5 MG DOSE) BY MOUTH DAILY.  . metoprolol tartrate (LOPRESSOR) 50 MG tablet TAKE 1 TABLET BY MOUTH EVERY DAY  . Tiotropium Bromide Monohydrate 2.5 MCG/ACT AERS Inhale 2 puffs into the lungs 2 (two) times daily.  . traZODone (DESYREL) 50 MG tablet Take 0.5 tablets (25 mg total) by mouth at bedtime.     Allergies:   Influenza vaccines   Social History   Socioeconomic History  . Marital status: Single    Spouse name: Not on file  . Number of children: Not on file  . Years of education: Not on file  . Highest education level: Not on file  Occupational History  . Not on file  Social Needs  . Financial resource strain: Not on file  . Food insecurity    Worry: Not on file    Inability: Not on file  . Transportation needs    Medical: Not on file    Non-medical: Not on file  Tobacco Use  . Smoking status: Former Research scientist (life sciences)  .  Smokeless tobacco: Never Used  Substance and Sexual Activity  . Alcohol use: Yes  . Drug use: Never  . Sexual activity: Not Currently  Lifestyle  . Physical activity    Days per week: Not on file    Minutes per session: Not on file  . Stress: Not on file  Relationships  . Social Herbalist on phone: Not on file    Gets together: Not on file    Attends religious service: Not on file    Active member of club or organization: Not on file    Attends meetings of clubs or organizations: Not on file    Relationship status: Not on file  Other Topics Concern  . Not on file  Social History Narrative  . Not on file     Family History: The patient's family history includes Breast cancer in her mother; Lung cancer in her father.  ROS:   Please see  the history of present illness.     All other systems reviewed and are negative.  EKGs/Labs/Other Studies Reviewed:    The following studies were reviewed today:   EKG:  EKG is  ordered today.  The ekg ordered today demonstrates normal sinus rhythm with occasional premature SVC's.  Recent Labs: 03/12/2018: ALT 12; BUN 18; Hemoglobin 12.5; Platelets 207.0; Potassium 4.4; Sodium 138; TSH 2.64 10/31/2018: Creatinine, Ser 1.10  Recent Lipid Panel    Component Value Date/Time   CHOL 141 03/12/2018 0755   TRIG 133.0 03/12/2018 0755   HDL 45.50 03/12/2018 0755   CHOLHDL 3 03/12/2018 0755   VLDL 26.6 03/12/2018 0755   LDLCALC 69 03/12/2018 0755    Physical Exam:    VS:  BP 130/72 (BP Location: Right Arm, Patient Position: Sitting, Cuff Size: Normal)   Pulse 69   Ht 5\' 5"  (1.651 m)   Wt 145 lb (65.8 kg)   SpO2 94%   BMI 24.13 kg/m     Wt Readings from Last 3 Encounters:  12/03/18 145 lb (65.8 kg)  11/22/18 147 lb 6.4 oz (66.9 kg)  11/14/18 148 lb (67.1 kg)     GEN:  Well nourished, well developed in no acute distress HEENT: Normal NECK: No JVD; No carotid bruits LYMPHATICS: No lymphadenopathy CARDIAC: RRR, 2/6 systolic murmur in the right upper and lower border, no rubs, no gallops RESPIRATORY:  Clear to auscultation without rales, wheezing or rhonchi  ABDOMEN: Soft, non-tender, non-distended MUSCULOSKELETAL:  No edema; No deformity  SKIN: Warm and dry NEUROLOGIC:  Alert and oriented x 3 PSYCHIATRIC:  Normal affect   ASSESSMENT:    The Revised Cardiac Risk Index indicates that her Perioperative Risk of Major Cardiac Event is (%): 6.6.   Her Functional Capacity in METs is: 5.62 as indicated by the Duke Activity Status Index (DASI).Even though her RCRI index is elevated, it must be noted that patient is 75, able to walk for at least a mile every day without any rest pain or shortness of breath.  Her blood pressure and blood cholesterol is adequately controlled.  She does  not have any history of ischemia or heart failure.  She however has a 2/6 systolic murmur.  Patient is medically optimized for surgery pending the results of the echocardiogram.    The patient will be scheduled for an echocardiogram.     1. Pre-operative cardiovascular examination   2. Carotid stenosis, symptomatic w/o infarct, left   3. Murmur   4. Essential hypertension    PLAN:  In order of problems listed above:  1. We will get an echocardiogram as stated above. 2. Continue Lipitor, Plavix as currently prescribed. 3. Likely aortic sclerosis or stenosis, we will get an echo. 4. Blood pressure adequately controlled.  Follow-up after echo.  Medication Adjustments/Labs and Tests Ordered: Current medicines are reviewed at length with the patient today.  Concerns regarding medicines are outlined above.  Orders Placed This Encounter  Procedures  . EKG 12-Lead  . ECHOCARDIOGRAM COMPLETE   No orders of the defined types were placed in this encounter.   Patient Instructions  Medication Instructions:  Your physician recommends that you continue on your current medications as directed. Please refer to the Current Medication list given to you today.  If you need a refill on your cardiac medications before your next appointment, please call your pharmacy.   Lab work: None ordered If you have labs (blood work) drawn today and your tests are completely normal, you will receive your results only by: Marland Kitchen MyChart Message (if you have MyChart) OR . A paper copy in the mail If you have any lab test that is abnormal or we need to change your treatment, we will call you to review the results.  Testing/Procedures: Your physician has requested that you have an echocardiogram. Echocardiography is a painless test that uses sound waves to create images of your heart. It provides your doctor with information about the size and shape of your heart and how well your heart's chambers and valves are  working. This procedure takes approximately one hour. There are no restrictions for this procedure.    Follow-Up: At [facility], you and your health needs are our priority.  As part of our continuing mission to provide you with exceptional heart care, we have created designated Provider Care Teams.  These Care Teams include your primary Cardiologist (physician) and Advanced Practice Providers (APPs -  Physician Assistants and Nurse Practitioners) who all work together to provide you with the care you need, when you need it. You will need a follow up appointment after the echo .  appointment.  You may see Dr. Garen Lah or one of the following Advanced Practice Providers on your designated Care Team:   Murray Hodgkins, NP Christell Faith, PA-C . Marrianne Mood, PA-C  Any Other Special Instructions Will Be Listed Below (If Applicable). N/A      Signed, Kate Sable, MD  12/03/2018 10:50 AM    [place]

## 2018-12-03 NOTE — Patient Instructions (Signed)
Medication Instructions:  Your physician recommends that you continue on your current medications as directed. Please refer to the Current Medication list given to you today.  If you need a refill on your cardiac medications before your next appointment, please call your pharmacy.   Lab work: None ordered If you have labs (blood work) drawn today and your tests are completely normal, you will receive your results only by: Marland Kitchen MyChart Message (if you have MyChart) OR . A paper copy in the mail If you have any lab test that is abnormal or we need to change your treatment, we will call you to review the results.  Testing/Procedures: Your physician has requested that you have an echocardiogram. Echocardiography is a painless test that uses sound waves to create images of your heart. It provides your doctor with information about the size and shape of your heart and how well your heart's chambers and valves are working. This procedure takes approximately one hour. There are no restrictions for this procedure.    Follow-Up: At [facility], you and your health needs are our priority.  As part of our continuing mission to provide you with exceptional heart care, we have created designated Provider Care Teams.  These Care Teams include your primary Cardiologist (physician) and Advanced Practice Providers (APPs -  Physician Assistants and Nurse Practitioners) who all work together to provide you with the care you need, when you need it. You will need a follow up appointment after the echo .  appointment.  You may see Dr. Garen Lah or one of the following Advanced Practice Providers on your designated Care Team:   Murray Hodgkins, NP Christell Faith, PA-C . Marrianne Mood, PA-C  Any Other Special Instructions Will Be Listed Below (If Applicable). N/A

## 2018-12-17 ENCOUNTER — Other Ambulatory Visit: Payer: Self-pay | Admitting: Family

## 2019-01-10 ENCOUNTER — Other Ambulatory Visit: Payer: Self-pay

## 2019-01-10 ENCOUNTER — Ambulatory Visit (INDEPENDENT_AMBULATORY_CARE_PROVIDER_SITE_OTHER): Payer: Medicare Other

## 2019-01-10 DIAGNOSIS — R011 Cardiac murmur, unspecified: Secondary | ICD-10-CM | POA: Diagnosis not present

## 2019-01-14 ENCOUNTER — Encounter: Payer: Self-pay | Admitting: Cardiology

## 2019-01-14 ENCOUNTER — Other Ambulatory Visit: Payer: Self-pay

## 2019-01-14 ENCOUNTER — Ambulatory Visit (INDEPENDENT_AMBULATORY_CARE_PROVIDER_SITE_OTHER): Payer: Medicare Other | Admitting: Cardiology

## 2019-01-14 VITALS — BP 170/80 | HR 63 | Temp 96.6°F | Ht 65.0 in | Wt 146.0 lb

## 2019-01-14 DIAGNOSIS — I6522 Occlusion and stenosis of left carotid artery: Secondary | ICD-10-CM

## 2019-01-14 DIAGNOSIS — Z01810 Encounter for preprocedural cardiovascular examination: Secondary | ICD-10-CM

## 2019-01-14 DIAGNOSIS — I10 Essential (primary) hypertension: Secondary | ICD-10-CM | POA: Diagnosis not present

## 2019-01-14 DIAGNOSIS — R011 Cardiac murmur, unspecified: Secondary | ICD-10-CM

## 2019-01-14 NOTE — Progress Notes (Signed)
Cardiology Office Note:    Date:  01/14/2019   ID:  Natalie [name], DOB 31-May-1929, MRN [id number]  PCP:  [name], FNP  Cardiologist:  No primary care provider on file.  Electrophysiologist:  None   Referring MD: [name], FNP   Chief Complaint  Patient presents with  . office visit    F/U after echo. Meds verbally reviewed with patient.    History of Present Illness:    Natalie [name] is a 85 y.o. female with a hx of hypertension, TIA, carotid artery stenosis who presents for follow-up.  She was originally seen for a pre surgical evaluation prior to undergoing carotid endarterectomy.    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  At the time she underwent carotid artery imaging which revealed stenosis.  She has been followed since, with repeat imaging via a neck CT angio revealing severe stenosis in the left carotid artery.  Surgery is being planned.  She denies any cardiac disease history such as MI, or heart failure.  She walks at least 1 mile every day, denies chest pain or shortness of breath.  Also denies edema.  She takes Plavix 75 mg daily.  Her blood pressure and cholesterol are currently well managed with medications.  Patient's blood pressure is always normal at home.  Even the last office visits have shown well-controlled blood pressure.  She is in the room today with her son who states she had a long walk to the office today which may explain the elevated blood pressure.  Past Medical History:  Diagnosis Date  . Allergies    hay fever  . Arthritis   . Breast cancer, left ([place]) 2015   Left total mastectomy  . GERD (gastroesophageal reflux disease)   . Hyperlipemia   . Hypertension   . Phlebitis    TIA    Past Surgical History:  Procedure Laterality Date  . BREAST BIOPSY  2014  . BREAST LUMPECTOMY  2014   left side  . MASTECTOMY Left May 9th 2017  . RHINOPLASTY  1956  . TONSILLECTOMY  1939  .  TOTAL HIP ARTHROPLASTY Right 2015    Current Medications: Current Meds  Medication Sig  . amLODipine (NORVASC) 10 MG tablet TAKE 1 TABLET BY MOUTH EVERY DAY  . atorvastatin (LIPITOR) 20 MG tablet TAKE 1 TABLET BY MOUTH EVERY DAY  . b complex vitamins tablet Take 1 tablet by mouth daily.  . Calcium Citrate-Vitamin D 250-200 MG-UNIT TABS Take 250 mg by mouth every morning.  . cholecalciferol (VITAMIN D3) 25 MCG (1000 UT) tablet Take 2,000 Units by mouth daily.  . clopidogrel (PLAVIX) 75 MG tablet TAKE 1 TABLET BY MOUTH EVERY DAY  . fluticasone (FLONASE) 50 MCG/ACT nasal spray Place 2 sprays into both nostrils daily.  Marland Kitchen lisinopril (ZESTRIL) 5 MG tablet TAKE ONE TABLET (5 MG DOSE) BY MOUTH DAILY.  . metoprolol tartrate (LOPRESSOR) 50 MG tablet TAKE 1 TABLET BY MOUTH EVERY DAY  . Tiotropium Bromide Monohydrate 2.5 MCG/ACT AERS Inhale 2 puffs into the lungs 2 (two) times daily.  . traZODone (DESYREL) 50 MG tablet Take 0.5 tablets (25 mg total) by mouth at bedtime.     Allergies:   Influenza vaccines   Social History   Socioeconomic History  . Marital status: Single    Spouse name: Not on file  . Number of children: Not on file  . Years of education: Not on file  .  Highest education level: Not on file  Occupational History  . Not on file  Social Needs  . Financial resource strain: Not on file  . Food insecurity    Worry: Not on file    Inability: Not on file  . Transportation needs    Medical: Not on file    Non-medical: Not on file  Tobacco Use  . Smoking status: Former Research scientist (life sciences)  . Smokeless tobacco: Never Used  Substance and Sexual Activity  . Alcohol use: Yes  . Drug use: Never  . Sexual activity: Not Currently  Lifestyle  . Physical activity    Days per week: Not on file    Minutes per session: Not on file  . Stress: Not on file  Relationships  . Social Herbalist on phone: Not on file    Gets together: Not on file    Attends religious service: Not on file     Active member of club or organization: Not on file    Attends meetings of clubs or organizations: Not on file    Relationship status: Not on file  Other Topics Concern  . Not on file  Social History Narrative  . Not on file     Family History: The patient's family history includes Breast cancer in her mother; Lung cancer in her father.  ROS:   Please see the history of present illness.     All other systems reviewed and are negative.  EKGs/Labs/Other Studies Reviewed:    The following studies were reviewed today: Echocardiogram dated 01/10/2019 1. Left ventricular ejection fraction, by visual estimation, is 55%. The left ventricle has normal function. There is mild to moderately increased left ventricular hypertrophy.  2. Left ventricular diastolic parameters are consistent with Grade I diastolic dysfunction (impaired relaxation).  3. Global right ventricle has normal systolic function.The right ventricular size is normal. No increase in right ventricular wall thickness.  4. Left atrial size was mildly dilated.  5. Tricuspid valve regurgitation is mild.  6. Moderate aortic valve sclerosis without stenosis.  7. Mildly elevated pulmonary artery systolic pressure.  EKG:  EKG is  ordered today.  The ekg ordered today demonstrates normal sinus rhythm, cannot rule out an old septal infarct..  Recent Labs: 03/12/2018: ALT 12; BUN 18; Hemoglobin 12.5; Platelets 207.0; Potassium 4.4; Sodium 138; TSH 2.64 10/31/2018: Creatinine, Ser 1.10  Recent Lipid Panel    Component Value Date/Time   CHOL 141 03/12/2018 0755   TRIG 133.0 03/12/2018 0755   HDL 45.50 03/12/2018 0755   CHOLHDL 3 03/12/2018 0755   VLDL 26.6 03/12/2018 0755   LDLCALC 69 03/12/2018 0755    Physical Exam:    VS:  BP (!) 170/80 (BP Location: Left Arm, Patient Position: Sitting, Cuff Size: Normal)   Pulse 63   Temp (!) 96.6 F (35.9 C)   Ht 5\' 5"  (1.651 m)   Wt 146 lb (66.2 kg)   SpO2 92%   BMI 24.30 kg/m      Wt Readings from Last 3 Encounters:  01/14/19 146 lb (66.2 kg)  12/03/18 145 lb (65.8 kg)  11/22/18 147 lb 6.4 oz (66.9 kg)     GEN:  Well nourished, well developed in no acute distress HEENT: Normal NECK: No JVD; No carotid bruits LYMPHATICS: No lymphadenopathy CARDIAC: RRR, 2/6 systolic murmur in the right upper and lower border, no rubs, no gallops RESPIRATORY:  Clear to auscultation without rales, wheezing or rhonchi  ABDOMEN: Soft, non-tender, non-distended MUSCULOSKELETAL:  No edema; No deformity  SKIN: Warm and dry NEUROLOGIC:  Alert and oriented x 3 PSYCHIATRIC:  Normal affect   ASSESSMENT:    The Revised Cardiac Risk Index indicates that her Perioperative Risk of Major Cardiac Event is (%): 6.6.   Her Functional Capacity in METs is: 5.62 as indicated by the Duke Activity Status Index (DASI).Even though her RCRI index is elevated, it must be noted that patient is 8, able to walk for at least a mile every day without any chest pain or shortness of breath.  Her blood pressure and blood cholesterol is adequately managed.  Her echocardiogram shows normal ejection fraction with EF of 55%, moderate aortic sclerosis without stenosis which would explain the systolic murmur auscultated on exam.  She is therefore optimized as possible for her procedure.  From a cardiac perspective she can proceed with the procedure.  1. Pre-operative cardiovascular examination   2. Carotid stenosis, symptomatic w/o infarct, left   3. Murmur   4. Essential hypertension    PLAN:    In order of problems listed above:  1. Okay to proceed with procedure from a cardiac perspective. 2. Continue Lipitor, Plavix as currently prescribed. 3. aortic sclerosis without stenosis.  4. Blood pressure elevated.  Monitor for now, as her last blood pressures have been within normal limits.  If stays elevated we will plan on titrating blood pressure meds.  Follow-up in about a year  Total encounter time more  than 25 minutes  Greater than 50% was spent in counseling and coordination of care with the patient  This note was generated in part or whole with voice recognition software. Voice recognition is usually quite accurate but there are transcription errors that can and very often do occur. I apologize for any typographical errors that were not detected and corrected.   Medication Adjustments/Labs and Tests Ordered: Current medicines are reviewed at length with the patient today.  Concerns regarding medicines are outlined above.  Orders Placed This Encounter  Procedures  . EKG 12-Lead   No orders of the defined types were placed in this encounter.   Patient Instructions  Medication Instructions:  Your physician recommends that you continue on your current medications as directed. Please refer to the Current Medication list given to you today.  *If you need a refill on your cardiac medications before your next appointment, please call your pharmacy*  Lab Work: NONE If you have labs (blood work) drawn today and your tests are completely normal, you will receive your results only by: Marland Kitchen MyChart Message (if you have MyChart) OR . A paper copy in the mail If you have any lab test that is abnormal or we need to change your treatment, we will call you to review the results.  Testing/Procedures: NONE  Follow-Up: At [facility], you and your health needs are our priority.  As part of our continuing mission to provide you with exceptional heart care, we have created designated Provider Care Teams.  These Care Teams include your primary Cardiologist (physician) and Advanced Practice Providers (APPs -  Physician Assistants and Nurse Practitioners) who all work together to provide you with the care you need, when you need it.  Your next appointment:   12 months  The format for your next appointment:   In Person  Provider:    You may see Dr. Garen Lah or one of the following Advanced  Practice Providers on your designated Care Team:    Murray Hodgkins, NP  Christell Faith, PA-C  Marrianne Mood, PA-C      Signed, Kate Sable, MD  01/14/2019 10:14 AM    [place]

## 2019-01-14 NOTE — Patient Instructions (Signed)
Medication Instructions:  Your physician recommends that you continue on your current medications as directed. Please refer to the Current Medication list given to you today.  *If you need a refill on your cardiac medications before your next appointment, please call your pharmacy*  Lab Work: NONE If you have labs (blood work) drawn today and your tests are completely normal, you will receive your results only by: Marland Kitchen MyChart Message (if you have MyChart) OR . A paper copy in the mail If you have any lab test that is abnormal or we need to change your treatment, we will call you to review the results.  Testing/Procedures: NONE  Follow-Up: At [facility], you and your health needs are our priority.  As part of our continuing mission to provide you with exceptional heart care, we have created designated Provider Care Teams.  These Care Teams include your primary Cardiologist (physician) and Advanced Practice Providers (APPs -  Physician Assistants and Nurse Practitioners) who all work together to provide you with the care you need, when you need it.  Your next appointment:   12 months  The format for your next appointment:   In Person  Provider:    You may see Dr. Garen Lah or one of the following Advanced Practice Providers on your designated Care Team:    Murray Hodgkins, NP  Christell Faith, PA-C  Marrianne Mood, PA-C

## 2019-01-30 ENCOUNTER — Encounter (INDEPENDENT_AMBULATORY_CARE_PROVIDER_SITE_OTHER): Payer: Self-pay | Admitting: Vascular Surgery

## 2019-01-30 ENCOUNTER — Ambulatory Visit (INDEPENDENT_AMBULATORY_CARE_PROVIDER_SITE_OTHER): Payer: Medicare Other | Admitting: Vascular Surgery

## 2019-01-30 ENCOUNTER — Other Ambulatory Visit: Payer: Self-pay

## 2019-01-30 VITALS — BP 142/57 | HR 56 | Resp 20 | Ht 65.0 in | Wt 144.0 lb

## 2019-01-30 DIAGNOSIS — J449 Chronic obstructive pulmonary disease, unspecified: Secondary | ICD-10-CM

## 2019-01-30 DIAGNOSIS — I10 Essential (primary) hypertension: Secondary | ICD-10-CM | POA: Diagnosis not present

## 2019-01-30 DIAGNOSIS — I739 Peripheral vascular disease, unspecified: Secondary | ICD-10-CM

## 2019-01-30 DIAGNOSIS — E785 Hyperlipidemia, unspecified: Secondary | ICD-10-CM

## 2019-01-30 DIAGNOSIS — I6522 Occlusion and stenosis of left carotid artery: Secondary | ICD-10-CM | POA: Diagnosis not present

## 2019-01-31 ENCOUNTER — Telehealth (INDEPENDENT_AMBULATORY_CARE_PROVIDER_SITE_OTHER): Payer: Self-pay

## 2019-01-31 NOTE — Telephone Encounter (Signed)
Patient called and left a message wanting to know when her surgery will be scheduled. I returned the call and had to leave a message to let the patient know that I was working on getting her scheduled for surgery and will call as soon as it was scheduled.

## 2019-02-02 ENCOUNTER — Encounter (INDEPENDENT_AMBULATORY_CARE_PROVIDER_SITE_OTHER): Payer: Self-pay | Admitting: Vascular Surgery

## 2019-02-02 NOTE — Progress Notes (Signed)
MRN : [id number]  Natalie [name] is a 85 y.o. (Aug 25, 1929) female who presents with chief complaint of  Chief Complaint  Patient presents with  . Follow-up    discuss surgery  .  History of Present Illness:   The patient is seen for follow up evaluation of carotid stenosis status post CT angiogram. CT scan was done 11/01/2018. Patient reports that the test went well with no problems or complications.   The patient denies interval amaurosis fugax. There is no recent or interval TIA symptoms or focal motor deficits. There is no prior documented CVA.  The patient is taking enteric-coated aspirin 81 mg daily.  There is no history of migraine headaches. There is no history of seizures.  The patient has a history of coronary artery disease, no recent episodes of angina or shortness of breath. The patient denies PAD or claudication symptoms. There is a history of hyperlipidemia which is being treated with a statin.   CT angiogram is reviewed by me personally and shows [id number] LICA associated with a high grade kink.   Current Meds  Medication Sig  . amLODipine (NORVASC) 10 MG tablet TAKE 1 TABLET BY MOUTH EVERY DAY  . atorvastatin (LIPITOR) 20 MG tablet TAKE 1 TABLET BY MOUTH EVERY DAY  . b complex vitamins tablet Take 1 tablet by mouth daily.  . Calcium Citrate-Vitamin D 250-200 MG-UNIT TABS Take 250 mg by mouth every morning.  . cholecalciferol (VITAMIN D3) 25 MCG (1000 UT) tablet Take 2,000 Units by mouth daily.  . clopidogrel (PLAVIX) 75 MG tablet TAKE 1 TABLET BY MOUTH EVERY DAY  . fluticasone (FLONASE) 50 MCG/ACT nasal spray Place 2 sprays into both nostrils daily.  Marland Kitchen lisinopril (ZESTRIL) 5 MG tablet TAKE ONE TABLET (5 MG DOSE) BY MOUTH DAILY.  . metoprolol tartrate (LOPRESSOR) 50 MG tablet TAKE 1 TABLET BY MOUTH EVERY DAY  . Tiotropium Bromide Monohydrate 2.5 MCG/ACT AERS Inhale 2 puffs into the lungs 2 (two) times daily.  . traZODone (DESYREL) 50 MG tablet Take 0.5  tablets (25 mg total) by mouth at bedtime.    Past Medical History:  Diagnosis Date  . Allergies    hay fever  . Arthritis   . Breast cancer, left ([place]) 2015   Left total mastectomy  . GERD (gastroesophageal reflux disease)   . Hyperlipemia   . Hypertension   . Phlebitis    TIA    Past Surgical History:  Procedure Laterality Date  . BREAST BIOPSY  2014  . BREAST LUMPECTOMY  2014   left side  . MASTECTOMY Left May 9th 2017  . RHINOPLASTY  1956  . TONSILLECTOMY  1939  . TOTAL HIP ARTHROPLASTY Right 2015    Social History Social History   Tobacco Use  . Smoking status: Former Research scientist (life sciences)  . Smokeless tobacco: Never Used  Substance Use Topics  . Alcohol use: Yes  . Drug use: Never    Family History Family History  Problem Relation Age of Onset  . Breast cancer Mother   . Lung cancer Father     Allergies  Allergen Reactions  . Influenza Vaccines Shortness Of Breath     REVIEW OF SYSTEMS (Negative unless checked)  Constitutional: [] Weight loss  [] Fever  [] Chills Cardiac: [] Chest pain   [] Chest pressure   [] Palpitations   [] Shortness of breath when laying flat   [] Shortness of breath with exertion. Vascular:  [] Pain in legs with walking   [] Pain in legs at rest  [] History of  DVT   [] Phlebitis   [] Swelling in legs   [] Varicose veins   [] Non-healing ulcers Pulmonary:   [] Uses home oxygen   [] Productive cough   [] Hemoptysis   [] Wheeze  [] COPD   [] Asthma Neurologic:  [] Dizziness   [] Seizures   [] History of stroke   [x] History of TIA  [] Aphasia   [] Vissual changes   [] Weakness or numbness in arm   [] Weakness or numbness in leg Musculoskeletal:   [] Joint swelling   [] Joint pain   [] Low back pain Hematologic:  [] Easy bruising  [] Easy bleeding   [] Hypercoagulable state   [] Anemic Gastrointestinal:  [] Diarrhea   [] Vomiting  [] Gastroesophageal reflux/heartburn   [] Difficulty swallowing. Genitourinary:  [] Chronic kidney disease   [] Difficult urination  [] Frequent urination    [] Blood in urine Skin:  [] Rashes   [] Ulcers  Psychological:  [] History of anxiety   []  History of major depression.  Physical Examination  Vitals:   01/30/19 1000  BP: (!) 142/57  Pulse: (!) 56  Resp: 20  Weight: 144 lb (65.3 kg)  Height: 5\' 5"  (1.651 m)   Body mass index is 23.96 kg/m. Gen: WD/WN, NAD Head: [place]/AT, No temporalis wasting.  Ear/Nose/Throat: Hearing grossly intact, nares w/o erythema or drainage Eyes: PER, EOMI, sclera nonicteric.  Neck: Supple, no large masses.   Pulmonary:  Good air movement, no audible wheezing bilaterally, no use of accessory muscles.  Cardiac: RRR, no JVD Vascular:  Bilateral carotid bruuits Vessel Right Left  Radial Palpable Palpable  Brachial Palpable Palpable  Carotid Palpable Palpable  Gastrointestinal: Non-distended. No guarding/no peritoneal signs.  Musculoskeletal: M/S 5/5 throughout.  No deformity or atrophy.  Neurologic: CN 2-12 intact. Symmetrical.  Speech is fluent. Motor exam as listed above. Psychiatric: Judgment intact, Mood & affect appropriate for pt's clinical situation. Dermatologic: No rashes or ulcers noted.  No changes consistent with cellulitis. Lymph : No lichenification or skin changes of chronic lymphedema.  CBC Lab Results  Component Value Date   WBC 7.2 03/12/2018   HGB 12.5 03/12/2018   HCT 36.7 03/12/2018   MCV 92.5 03/12/2018   PLT 207.0 03/12/2018    BMET    Component Value Date/Time   NA 138 03/12/2018 0755   K 4.4 03/12/2018 0755   CL 102 03/12/2018 0755   CO2 28 03/12/2018 0755   GLUCOSE 126 (H) 03/12/2018 0755   BUN 18 03/12/2018 0755   CREATININE 1.10 (H) 10/31/2018 1513   CALCIUM 9.8 03/12/2018 0755   CrCl cannot be calculated (Patient's most recent lab result is older than the maximum 21 days allowed.).  COAG No results found for: INR, PROTIME  Radiology No results found.   Assessment/Plan 1. Carotid stenosis, symptomatic w/o infarct, left Recommend:  The patient remains  asymptomatic with respect to the carotid stenosis since her last visit.  However, the patient has now progressed and has a lesion the is >75%.  Patient's CT angiography of the carotid arteries confirms >75% left ICA stenosis.  The anatomical considerations support surgery over stenting.  This was discussed in detail with the patient.  The patient does indeed need surgery, therefore, cardiac clearance will be arranged. Once cleared the patient will be scheduled for surgery.  The risks, benefits and alternative therapies were reviewed in detail with the patient.  All questions were answered.  The patient agrees to proceed with surgery of the left carotid artery.  Continue antiplatelet therapy as prescribed.  Plavix will be stopped three days before surgery and the ASA will be continued.  Continue  management of CAD, HTN and Hyperlipidemia. Healthy heart diet, encouraged exercise at least 4 times per week.    2. Peripheral vascular disease ([place])  Recommend:  The patient has evidence of atherosclerosis of the lower extremities with claudication.  The patient does not voice lifestyle limiting changes at this point in time.  Noninvasive studies do not suggest clinically significant change.  No invasive studies, angiography or surgery at this time The patient should continue walking and begin a more formal exercise program.  The patient should continue antiplatelet therapy and aggressive treatment of the lipid abnormalities  No changes in the patient's medications at this time  The patient should continue wearing graduated compression socks 10-15 mmHg strength to control the mild edema.    3. Essential hypertension Continue antihypertensive medications as already ordered, these medications have been reviewed and there are no changes at this time.   4. Chronic obstructive pulmonary disease, unspecified COPD type ([place]) Continue pulmonary medications and aerosols as already ordered, these  medications have been reviewed and there are no changes at this time.    5. Hyperlipidemia, unspecified hyperlipidemia type Continue statin as ordered and reviewed, no changes at this time     [name], MD  02/02/2019 10:55 AM

## 2019-02-05 ENCOUNTER — Encounter (INDEPENDENT_AMBULATORY_CARE_PROVIDER_SITE_OTHER): Payer: Self-pay

## 2019-02-05 NOTE — Telephone Encounter (Signed)
Spoke with the patient and she is now scheduled with Dr. Delana Meyer for [facility] on 02/28/2019. Patient will do her covid and pre-op testing on 02/25/2019 at 10:00 am at the [facility]. Pre-surgical instructions were discussed and will be mailed to the patient.

## 2019-02-24 ENCOUNTER — Other Ambulatory Visit (INDEPENDENT_AMBULATORY_CARE_PROVIDER_SITE_OTHER): Payer: Self-pay | Admitting: Nurse Practitioner

## 2019-02-25 ENCOUNTER — Encounter
Admission: RE | Admit: 2019-02-25 | Discharge: 2019-02-25 | Disposition: A | Discharge status: Home / Self Care | Payer: Medicare Other | Source: Ambulatory Visit | Attending: Vascular Surgery | Admitting: Vascular Surgery

## 2019-02-25 ENCOUNTER — Other Ambulatory Visit: Payer: Self-pay | Admitting: Family

## 2019-02-25 ENCOUNTER — Other Ambulatory Visit: Payer: Self-pay

## 2019-02-25 DIAGNOSIS — S1093XD Contusion of unspecified part of neck, subsequent encounter: Secondary | ICD-10-CM | POA: Diagnosis not present

## 2019-02-25 DIAGNOSIS — Y838 Other surgical procedures as the cause of abnormal reaction of the patient, or of later complication, without mention of misadventure at the time of the procedure: Secondary | ICD-10-CM | POA: Diagnosis not present

## 2019-02-25 DIAGNOSIS — Z801 Family history of malignant neoplasm of trachea, bronchus and lung: Secondary | ICD-10-CM | POA: Diagnosis not present

## 2019-02-25 DIAGNOSIS — J9600 Acute respiratory failure, unspecified whether with hypoxia or hypercapnia: Secondary | ICD-10-CM | POA: Diagnosis not present

## 2019-02-25 DIAGNOSIS — I97638 Postprocedural hematoma of a circulatory system organ or structure following other circulatory system procedure: Secondary | ICD-10-CM | POA: Diagnosis not present

## 2019-02-25 DIAGNOSIS — I6529 Occlusion and stenosis of unspecified carotid artery: Secondary | ICD-10-CM | POA: Diagnosis not present

## 2019-02-25 DIAGNOSIS — I739 Peripheral vascular disease, unspecified: Secondary | ICD-10-CM | POA: Insufficient documentation

## 2019-02-25 DIAGNOSIS — I10 Essential (primary) hypertension: Secondary | ICD-10-CM | POA: Diagnosis not present

## 2019-02-25 DIAGNOSIS — E785 Hyperlipidemia, unspecified: Secondary | ICD-10-CM | POA: Diagnosis not present

## 2019-02-25 DIAGNOSIS — Z8673 Personal history of transient ischemic attack (TIA), and cerebral infarction without residual deficits: Secondary | ICD-10-CM | POA: Diagnosis not present

## 2019-02-25 DIAGNOSIS — L7632 Postprocedural hematoma of skin and subcutaneous tissue following other procedure: Secondary | ICD-10-CM | POA: Diagnosis not present

## 2019-02-25 DIAGNOSIS — M1990 Unspecified osteoarthritis, unspecified site: Secondary | ICD-10-CM | POA: Diagnosis present

## 2019-02-25 DIAGNOSIS — Z20828 Contact with and (suspected) exposure to other viral communicable diseases: Secondary | ICD-10-CM | POA: Diagnosis not present

## 2019-02-25 DIAGNOSIS — Z4682 Encounter for fitting and adjustment of non-vascular catheter: Secondary | ICD-10-CM | POA: Diagnosis not present

## 2019-02-25 DIAGNOSIS — I771 Stricture of artery: Secondary | ICD-10-CM | POA: Diagnosis not present

## 2019-02-25 DIAGNOSIS — J449 Chronic obstructive pulmonary disease, unspecified: Secondary | ICD-10-CM | POA: Diagnosis not present

## 2019-02-25 DIAGNOSIS — I2510 Atherosclerotic heart disease of native coronary artery without angina pectoris: Secondary | ICD-10-CM | POA: Diagnosis not present

## 2019-02-25 DIAGNOSIS — R490 Dysphonia: Secondary | ICD-10-CM | POA: Diagnosis present

## 2019-02-25 DIAGNOSIS — S1093XA Contusion of unspecified part of neck, initial encounter: Secondary | ICD-10-CM | POA: Diagnosis not present

## 2019-02-25 DIAGNOSIS — Z887 Allergy status to serum and vaccine status: Secondary | ICD-10-CM | POA: Diagnosis not present

## 2019-02-25 DIAGNOSIS — Z9012 Acquired absence of left breast and nipple: Secondary | ICD-10-CM | POA: Diagnosis not present

## 2019-02-25 DIAGNOSIS — F329 Major depressive disorder, single episode, unspecified: Secondary | ICD-10-CM | POA: Diagnosis present

## 2019-02-25 DIAGNOSIS — Z01812 Encounter for preprocedural laboratory examination: Secondary | ICD-10-CM | POA: Insufficient documentation

## 2019-02-25 DIAGNOSIS — J9601 Acute respiratory failure with hypoxia: Secondary | ICD-10-CM | POA: Diagnosis not present

## 2019-02-25 DIAGNOSIS — I6522 Occlusion and stenosis of left carotid artery: Secondary | ICD-10-CM | POA: Diagnosis not present

## 2019-02-25 DIAGNOSIS — Z7982 Long term (current) use of aspirin: Secondary | ICD-10-CM | POA: Diagnosis not present

## 2019-02-25 DIAGNOSIS — Z96641 Presence of right artificial hip joint: Secondary | ICD-10-CM | POA: Diagnosis not present

## 2019-02-25 DIAGNOSIS — Z853 Personal history of malignant neoplasm of breast: Secondary | ICD-10-CM | POA: Diagnosis not present

## 2019-02-25 DIAGNOSIS — Z87891 Personal history of nicotine dependence: Secondary | ICD-10-CM | POA: Diagnosis not present

## 2019-02-25 DIAGNOSIS — K219 Gastro-esophageal reflux disease without esophagitis: Secondary | ICD-10-CM | POA: Diagnosis not present

## 2019-02-25 DIAGNOSIS — Z803 Family history of malignant neoplasm of breast: Secondary | ICD-10-CM | POA: Diagnosis not present

## 2019-02-25 DIAGNOSIS — J9382 Other air leak: Secondary | ICD-10-CM | POA: Diagnosis not present

## 2019-02-25 DIAGNOSIS — Z79899 Other long term (current) drug therapy: Secondary | ICD-10-CM | POA: Insufficient documentation

## 2019-02-25 HISTORY — DX: Chronic obstructive pulmonary disease, unspecified: J44.9

## 2019-02-25 LAB — TYPE AND SCREEN
ABO/RH(D): A POS
Antibody Screen: NEGATIVE

## 2019-02-25 LAB — BASIC METABOLIC PANEL
Anion gap: 8 (ref 5–15)
BUN: 21 mg/dL (ref 8–23)
CO2: 27 mmol/L (ref 22–32)
Calcium: 10 mg/dL (ref 8.9–10.3)
Chloride: 100 mmol/L (ref 98–111)
Creatinine, Ser: 1.13 mg/dL — ABNORMAL HIGH (ref 0.44–1.00)
GFR calc Af Amer: 51 mL/min — ABNORMAL LOW (ref >60)
GFR calc non Af Amer: 44 mL/min — ABNORMAL LOW (ref >60)
Glucose, Bld: 127 mg/dL — ABNORMAL HIGH (ref 70–99)
Potassium: 4.3 mmol/L (ref 3.5–5.1)
Sodium: 135 mmol/L (ref 135–145)

## 2019-02-25 LAB — CBC WITH DIFFERENTIAL/PLATELET
Abs Immature Granulocytes: 0.03 10*3/uL (ref 0.00–0.07)
Basophils Absolute: 0.1 10*3/uL (ref 0.0–0.1)
Basophils Relative: 1 %
Eosinophils Absolute: 0.2 10*3/uL (ref 0.0–0.5)
Eosinophils Relative: 2 %
HCT: 38 % (ref 36.0–46.0)
Hemoglobin: 13.2 g/dL (ref 12.0–15.0)
Immature Granulocytes: 0 %
Lymphocytes Relative: 19 %
Lymphs Abs: 1.4 10*3/uL (ref 0.7–4.0)
MCH: 30.8 pg (ref 26.0–34.0)
MCHC: 34.7 g/dL (ref 30.0–36.0)
MCV: 88.6 fL (ref 80.0–100.0)
Monocytes Absolute: 0.8 10*3/uL (ref 0.1–1.0)
Monocytes Relative: 11 %
Neutro Abs: 5 10*3/uL (ref 1.7–7.7)
Neutrophils Relative %: 67 %
Platelets: 227 10*3/uL (ref 150–400)
RBC: 4.29 MIL/uL (ref 3.87–5.11)
RDW: 12.6 % (ref 11.5–15.5)
WBC: 7.4 10*3/uL (ref 4.0–10.5)
nRBC: 0 % (ref 0.0–0.2)

## 2019-02-25 LAB — PROTIME-INR
INR: 1 (ref 0.8–1.2)
Prothrombin Time: 12.9 seconds (ref 11.4–15.2)

## 2019-02-25 LAB — APTT: aPTT: 29 seconds (ref 24–36)

## 2019-02-25 LAB — SARS CORONAVIRUS 2 (TAT 6-24 HRS): SARS Coronavirus 2: NEGATIVE

## 2019-02-25 NOTE — Patient Instructions (Signed)
Your procedure is scheduled on: Friday 12/18 Report to Day Surgery. To find out your arrival time please call [phone] between 1PM - 3PM on Thurs 12/17.  Remember: Instructions that are not followed completely may result in serious medical risk,  up to and including death, or upon the discretion of your surgeon and anesthesiologist your  surgery may need to be rescheduled.     _X__ 1. Do not eat food after midnight the night before your procedure.                 No gum chewing or hard candies. You may drink clear liquids up to 2 hours                 before you are scheduled to arrive for your surgery- DO not drink clear                 liquids within 2 hours of the start of your surgery.                 Clear Liquids include:  water, apple juice without pulp, clear carbohydrate                 drink such as Clearfast of Gatorade, Black Coffee or Tea (Do not add                 anything to coffee or tea).  __X__2.  On the morning of surgery brush your teeth with toothpaste and water, you                may rinse your mouth with mouthwash if you wish.  Do not swallow any toothpaste of mouthwash.     _X__ 3.  No Alcohol for 24 hours before or after surgery.   ___ 4.  Do Not Smoke or use e-cigarettes For 24 Hours Prior to Your Surgery.                 Do not use any chewable tobacco products for at least 6 hours prior to                 surgery.  ____  5.  Bring all medications with you on the day of surgery if instructed.   _x___  6.  Notify your doctor if there is any change in your medical condition      (cold, fever, infections).     Do not wear jewelry, make-up, hairpins, clips or nail polish. Do not wear lotions, powders, or perfumes.  Do not shave 48 hours prior to surgery. Men may shave face and neck. Do not bring valuables to the hospital.    [facility] is not responsible for any belongings or valuables.  Contacts, dentures or bridgework may  not be worn into surgery. Leave your suitcase in the car. After surgery it may be brought to your room. For patients admitted to the hospital, discharge time is determined by your treatment team.   Patients discharged the day of surgery will not be allowed to drive home.   Please read over the following fact sheets that you were given:    __x__ Take these medicines the morning of surgery with A SIP OF WATER:    1. fluticasone (FLONASE) 50 MCG/ACT nasal spray  2. metoprolol tartrate (LOPRESSOR) 50 MG tablet  3.   4.  5.  6.  ____ Fleet Enema (as directed)   _x___ Use CHG Soap as directed  __x__ Use inhalers on the day of surgery Tiotropium Bromide Monohydrate 2.5 MCG/ACT AERS  ____ Stop metformin 2 days prior to surgery    ____ Take 1/2 of usual insulin dose the night before surgery. No insulin the morning          of surgery.   _x___ Stopped Plavix yesterday.  Taking Aspirin 81 mg now    Timmothy Sours not take the morning of surgery  __x__ Stop Anti-inflammatories    May take tylenol   ____ Stop supplements until after surgery.    ____ Bring C-Pap to the hospital.

## 2019-02-28 ENCOUNTER — Inpatient Hospital Stay: Payer: Medicare Other | Admitting: Anesthesiology

## 2019-02-28 ENCOUNTER — Encounter: Payer: Self-pay | Admitting: Vascular Surgery

## 2019-02-28 ENCOUNTER — Other Ambulatory Visit: Payer: Self-pay

## 2019-02-28 ENCOUNTER — Encounter
Admission: RE | Disposition: A | Discharge status: Home / Self Care | Payer: Self-pay | Source: Home / Self Care | Attending: Vascular Surgery

## 2019-02-28 ENCOUNTER — Inpatient Hospital Stay
Admission: RE | Admit: 2019-02-28 | Discharge: 2019-03-04 | DRG: 037 | Disposition: A | Discharge status: Home Health | Payer: Medicare Other | Attending: Vascular Surgery | Admitting: Vascular Surgery

## 2019-02-28 ENCOUNTER — Inpatient Hospital Stay: Payer: Medicare Other

## 2019-02-28 DIAGNOSIS — I739 Peripheral vascular disease, unspecified: Secondary | ICD-10-CM | POA: Diagnosis present

## 2019-02-28 DIAGNOSIS — I6522 Occlusion and stenosis of left carotid artery: Principal | ICD-10-CM

## 2019-02-28 DIAGNOSIS — J9600 Acute respiratory failure, unspecified whether with hypoxia or hypercapnia: Secondary | ICD-10-CM | POA: Diagnosis not present

## 2019-02-28 DIAGNOSIS — K219 Gastro-esophageal reflux disease without esophagitis: Secondary | ICD-10-CM | POA: Diagnosis present

## 2019-02-28 DIAGNOSIS — I97638 Postprocedural hematoma of a circulatory system organ or structure following other circulatory system procedure: Secondary | ICD-10-CM | POA: Diagnosis not present

## 2019-02-28 DIAGNOSIS — F329 Major depressive disorder, single episode, unspecified: Secondary | ICD-10-CM | POA: Diagnosis present

## 2019-02-28 DIAGNOSIS — I10 Essential (primary) hypertension: Secondary | ICD-10-CM | POA: Diagnosis present

## 2019-02-28 DIAGNOSIS — S1093XA Contusion of unspecified part of neck, initial encounter: Secondary | ICD-10-CM

## 2019-02-28 DIAGNOSIS — Z801 Family history of malignant neoplasm of trachea, bronchus and lung: Secondary | ICD-10-CM

## 2019-02-28 DIAGNOSIS — Z803 Family history of malignant neoplasm of breast: Secondary | ICD-10-CM | POA: Diagnosis not present

## 2019-02-28 DIAGNOSIS — Z978 Presence of other specified devices: Secondary | ICD-10-CM

## 2019-02-28 DIAGNOSIS — Z9012 Acquired absence of left breast and nipple: Secondary | ICD-10-CM

## 2019-02-28 DIAGNOSIS — Y838 Other surgical procedures as the cause of abnormal reaction of the patient, or of later complication, without mention of misadventure at the time of the procedure: Secondary | ICD-10-CM | POA: Diagnosis not present

## 2019-02-28 DIAGNOSIS — Z8673 Personal history of transient ischemic attack (TIA), and cerebral infarction without residual deficits: Secondary | ICD-10-CM | POA: Diagnosis not present

## 2019-02-28 DIAGNOSIS — Z853 Personal history of malignant neoplasm of breast: Secondary | ICD-10-CM

## 2019-02-28 DIAGNOSIS — I2510 Atherosclerotic heart disease of native coronary artery without angina pectoris: Secondary | ICD-10-CM | POA: Diagnosis present

## 2019-02-28 DIAGNOSIS — M1990 Unspecified osteoarthritis, unspecified site: Secondary | ICD-10-CM | POA: Diagnosis present

## 2019-02-28 DIAGNOSIS — Z7982 Long term (current) use of aspirin: Secondary | ICD-10-CM | POA: Diagnosis not present

## 2019-02-28 DIAGNOSIS — Z87891 Personal history of nicotine dependence: Secondary | ICD-10-CM | POA: Diagnosis not present

## 2019-02-28 DIAGNOSIS — I6529 Occlusion and stenosis of unspecified carotid artery: Secondary | ICD-10-CM | POA: Diagnosis present

## 2019-02-28 DIAGNOSIS — J9382 Other air leak: Secondary | ICD-10-CM | POA: Diagnosis not present

## 2019-02-28 DIAGNOSIS — E785 Hyperlipidemia, unspecified: Secondary | ICD-10-CM | POA: Diagnosis present

## 2019-02-28 DIAGNOSIS — Z96641 Presence of right artificial hip joint: Secondary | ICD-10-CM | POA: Diagnosis present

## 2019-02-28 DIAGNOSIS — Z20828 Contact with and (suspected) exposure to other viral communicable diseases: Secondary | ICD-10-CM | POA: Diagnosis present

## 2019-02-28 DIAGNOSIS — R490 Dysphonia: Secondary | ICD-10-CM | POA: Diagnosis present

## 2019-02-28 DIAGNOSIS — J449 Chronic obstructive pulmonary disease, unspecified: Secondary | ICD-10-CM | POA: Diagnosis present

## 2019-02-28 DIAGNOSIS — S1093XD Contusion of unspecified part of neck, subsequent encounter: Secondary | ICD-10-CM | POA: Diagnosis not present

## 2019-02-28 DIAGNOSIS — J9601 Acute respiratory failure with hypoxia: Secondary | ICD-10-CM | POA: Diagnosis not present

## 2019-02-28 DIAGNOSIS — Z887 Allergy status to serum and vaccine status: Secondary | ICD-10-CM | POA: Diagnosis not present

## 2019-02-28 HISTORY — PX: EVACUATION OF CERVICAL HEMATOMA: SHX6695

## 2019-02-28 HISTORY — PX: ENDARTERECTOMY: SHX5162

## 2019-02-28 LAB — ABO/RH: ABO/RH(D): A POS

## 2019-02-28 LAB — MRSA PCR SCREENING: MRSA by PCR: POSITIVE — AB

## 2019-02-28 LAB — GLUCOSE, CAPILLARY: Glucose-Capillary: 242 mg/dL — ABNORMAL HIGH (ref 70–99)

## 2019-02-28 LAB — TRIGLYCERIDES: Triglycerides: 92 mg/dL (ref <150)

## 2019-02-28 IMAGING — DX DG CHEST 1V PORT
1 series · 1 of 1 positions shown · non-contrast
Comparison: CT [DATE].

CLINICAL DATA: Intubation.

EXAM:
PORTABLE CHEST 1 VIEW

[chest ap]
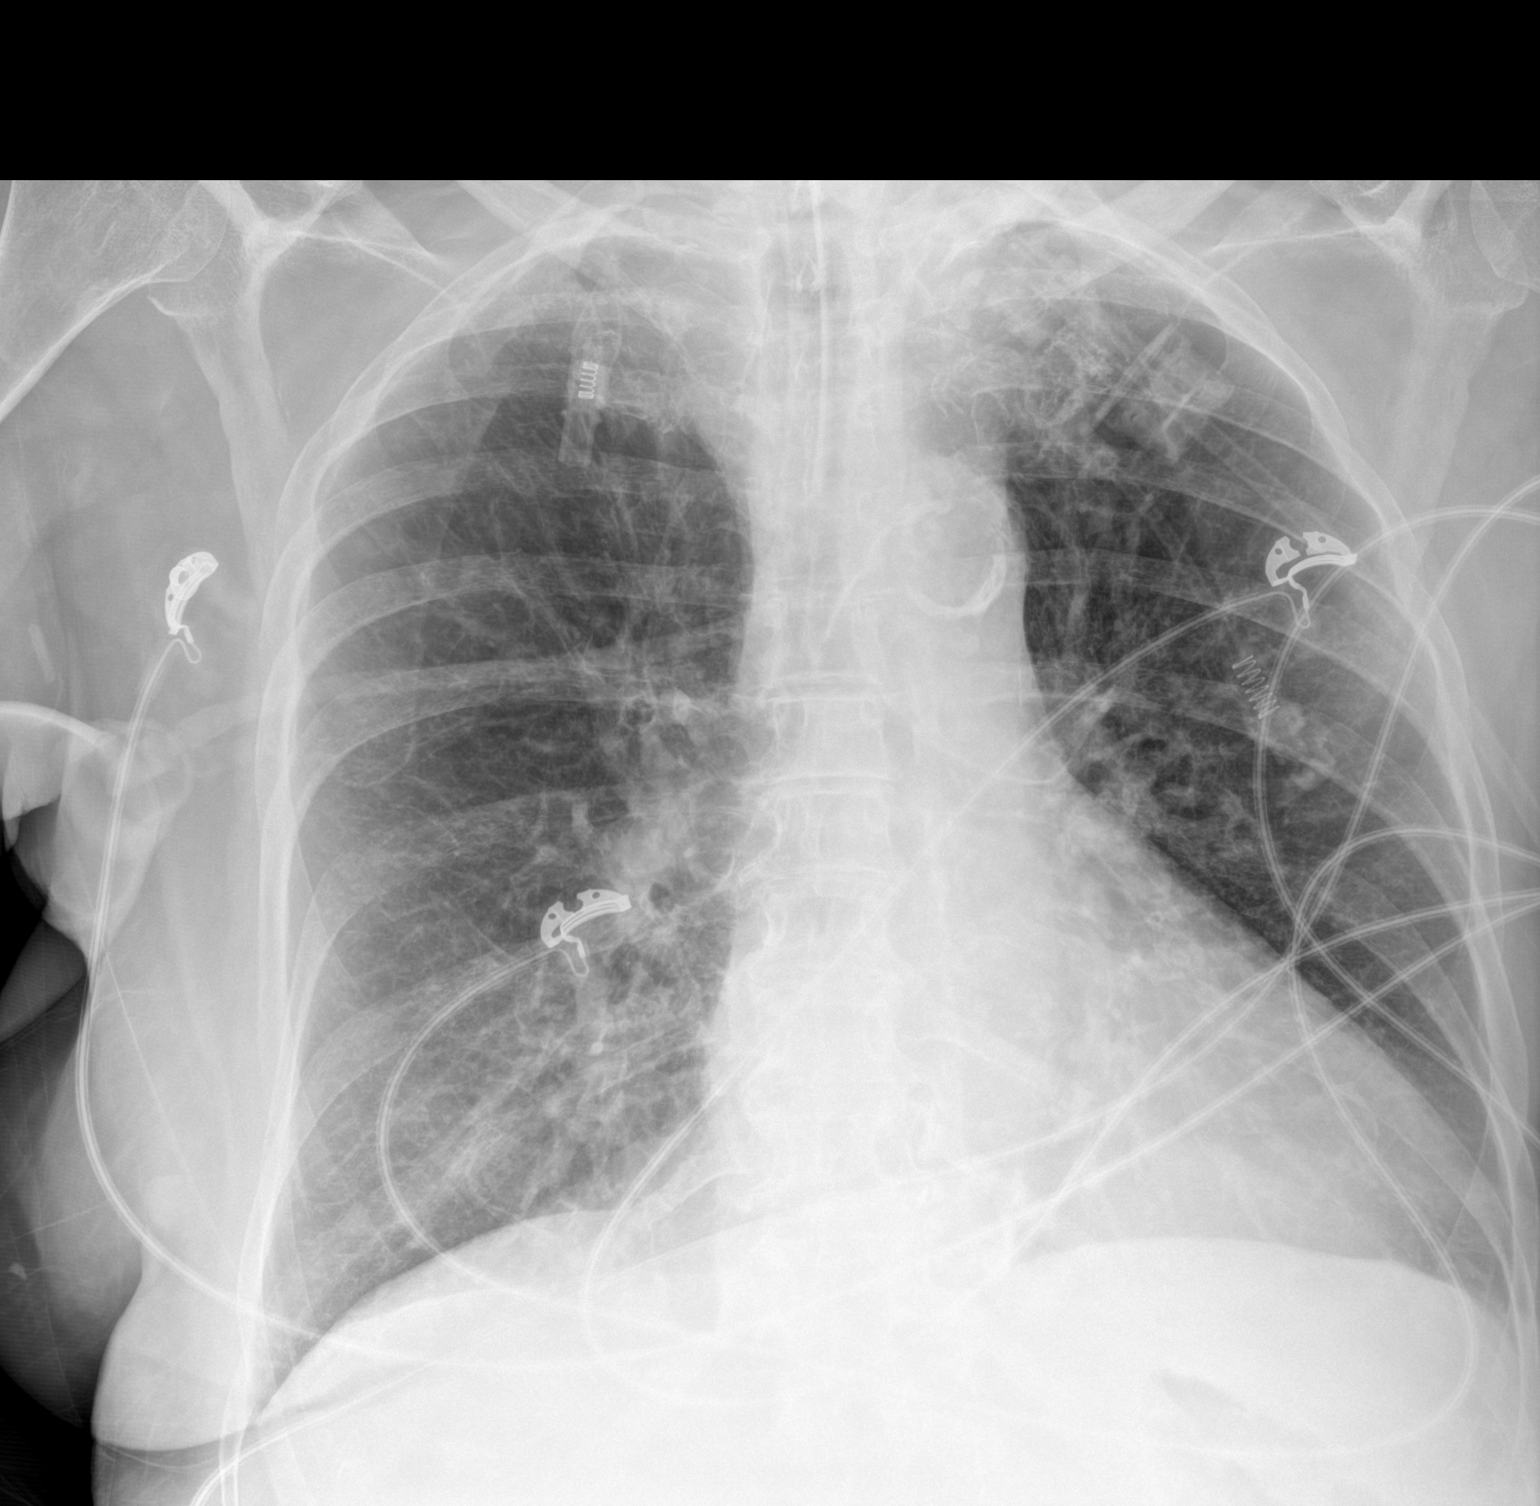

[1 of 1 positions shown; findings below may reference images not displayed]

FINDINGS: Endotracheal tube noted with its tip 4 cm above the carina. A
catheter is noted over the left neck, this could represent a left IJ
line, clinical correlation suggested. Cardiomegaly. No pulmonary
venous congestion. COPD. Mild right base infiltrate cannot be
excluded. No pleural effusion or pneumothorax. Left costophrenic
angle incompletely imaged.
IMPRESSION: 1.  Endotracheal tube noted with its tip 4 cm above the carina.

2.  COPD.  Mild right base infiltrate cannot be excluded.

## 2019-02-28 SURGERY — EVACUATION OF CERVICAL HEMATOMA
Anesthesia: General

## 2019-02-28 SURGERY — ENDARTERECTOMY, CAROTID
Anesthesia: General | Laterality: Left

## 2019-02-28 MED ORDER — CHLORHEXIDINE GLUCONATE CLOTH 2 % EX PADS
6.0000 | MEDICATED_PAD | Freq: Every day | CUTANEOUS | Status: DC
  Administered 2019-03-01 – 2019-03-04 (×4): 6 via TOPICAL

## 2019-02-28 MED ORDER — MIDAZOLAM HCL 2 MG/2ML IJ SOLN
INTRAMUSCULAR | Status: DC | PRN
  Administered 2019-02-28: 2 mg via INTRAVENOUS

## 2019-02-28 MED ORDER — HEPARIN SODIUM (PORCINE) 1000 UNIT/ML IJ SOLN
INTRAMUSCULAR | Status: DC | PRN
  Administered 2019-02-28: 7000 [IU] via INTRAVENOUS

## 2019-02-28 MED ORDER — LISINOPRIL 5 MG PO TABS: 5.0000 mg | ORAL_TABLET | Freq: Every day | ORAL | Status: DC

## 2019-02-28 MED ORDER — ROCURONIUM BROMIDE 100 MG/10ML IV SOLN
INTRAVENOUS | Status: DC | PRN
  Administered 2019-02-28: 10 mg via INTRAVENOUS
  Administered 2019-02-28 (×5): 20 mg via INTRAVENOUS
  Administered 2019-02-28: 30 mg via INTRAVENOUS

## 2019-02-28 MED ORDER — MORPHINE SULFATE (PF) 2 MG/ML IV SOLN
2.0000 mg | INTRAVENOUS | Status: DC | PRN
  Administered 2019-02-28 – 2019-03-02 (×6): 2 mg via INTRAVENOUS
  Filled 2019-02-28 (×6): qty 1

## 2019-02-28 MED ORDER — LABETALOL HCL 5 MG/ML IV SOLN
INTRAVENOUS | Status: AC
  Filled 2019-02-28: qty 4

## 2019-02-28 MED ORDER — ACETAMINOPHEN 325 MG PO TABS: 325.0000 mg | ORAL_TABLET | ORAL | Status: DC | PRN

## 2019-02-28 MED ORDER — FENTANYL CITRATE (PF) 100 MCG/2ML IJ SOLN
INTRAMUSCULAR | Status: DC | PRN
  Administered 2019-02-28 (×2): 25 ug via INTRAVENOUS
  Administered 2019-02-28: 50 ug via INTRAVENOUS

## 2019-02-28 MED ORDER — LIDOCAINE HCL (PF) 2 % IJ SOLN
INTRAMUSCULAR | Status: AC
  Filled 2019-02-28: qty 10

## 2019-02-28 MED ORDER — ROCURONIUM BROMIDE 50 MG/5ML IV SOLN
INTRAVENOUS | Status: AC
  Filled 2019-02-28: qty 2

## 2019-02-28 MED ORDER — KCL IN DEXTROSE-NACL 20-5-0.9 MEQ/L-%-% IV SOLN
INTRAVENOUS | Status: DC
  Administered 2019-02-28: 75 mL/h via INTRAVENOUS
  Filled 2019-02-28 (×3): qty 1000

## 2019-02-28 MED ORDER — LACTATED RINGERS IV SOLN: INTRAVENOUS | Status: DC

## 2019-02-28 MED ORDER — ALUM & MAG HYDROXIDE-SIMETH 200-200-20 MG/5ML PO SUSP: 15.0000 mL | ORAL | Status: DC | PRN

## 2019-02-28 MED ORDER — ATORVASTATIN CALCIUM 20 MG PO TABS
20.0000 mg | ORAL_TABLET | Freq: Every day | ORAL | Status: DC
  Administered 2019-03-01 – 2019-03-04 (×4): 20 mg via ORAL
  Filled 2019-02-28 (×4): qty 1

## 2019-02-28 MED ORDER — LIDOCAINE HCL (PF) 1 % IJ SOLN
INTRAMUSCULAR | Status: AC
  Filled 2019-02-28: qty 30

## 2019-02-28 MED ORDER — CHLORHEXIDINE GLUCONATE 0.12% ORAL RINSE (MEDLINE KIT)
15.0000 mL | Freq: Two times a day (BID) | OROMUCOSAL | Status: DC
  Administered 2019-02-28 – 2019-03-04 (×5): 15 mL via OROMUCOSAL

## 2019-02-28 MED ORDER — FENTANYL CITRATE (PF) 100 MCG/2ML IJ SOLN
INTRAMUSCULAR | Status: AC
  Filled 2019-02-28: qty 2

## 2019-02-28 MED ORDER — SODIUM CHLORIDE (PF) 0.9 % IJ SOLN
INTRAMUSCULAR | Status: AC
  Filled 2019-02-28: qty 50

## 2019-02-28 MED ORDER — PROPOFOL 1000 MG/100ML IV EMUL
5.0000 ug/kg/min | INTRAVENOUS | Status: DC
  Administered 2019-02-28: 50 ug/kg/min via INTRAVENOUS
  Administered 2019-02-28: 10 ug/kg/min via INTRAVENOUS
  Administered 2019-03-01 (×2): 60 ug/kg/min via INTRAVENOUS
  Filled 2019-02-28 (×3): qty 100

## 2019-02-28 MED ORDER — FAMOTIDINE 20 MG PO TABS
ORAL_TABLET | ORAL | Status: AC
  Administered 2019-02-28: 20 mg via ORAL
  Filled 2019-02-28: qty 1

## 2019-02-28 MED ORDER — HEMOSTATIC AGENTS (NO CHARGE) OPTIME
TOPICAL | Status: DC | PRN
  Administered 2019-02-28: 1 via TOPICAL

## 2019-02-28 MED ORDER — ONDANSETRON HCL 4 MG/2ML IJ SOLN
INTRAMUSCULAR | Status: DC | PRN
  Administered 2019-02-28: 4 mg via INTRAVENOUS

## 2019-02-28 MED ORDER — ONDANSETRON HCL 4 MG/2ML IJ SOLN
INTRAMUSCULAR | Status: AC
  Filled 2019-02-28: qty 4

## 2019-02-28 MED ORDER — ONDANSETRON HCL 4 MG/2ML IJ SOLN: 4.0000 mg | Freq: Four times a day (QID) | INTRAMUSCULAR | Status: DC | PRN

## 2019-02-28 MED ORDER — SEVOFLURANE IN SOLN
RESPIRATORY_TRACT | Status: AC
  Filled 2019-02-28: qty 250

## 2019-02-28 MED ORDER — DEXAMETHASONE SODIUM PHOSPHATE 10 MG/ML IJ SOLN
INTRAMUSCULAR | Status: AC
  Filled 2019-02-28: qty 2

## 2019-02-28 MED ORDER — LABETALOL HCL 5 MG/ML IV SOLN
INTRAVENOUS | Status: DC | PRN
  Administered 2019-02-28: 5 mg via INTRAVENOUS

## 2019-02-28 MED ORDER — METOPROLOL TARTRATE 5 MG/5ML IV SOLN: 2.0000 mg | INTRAVENOUS | Status: DC | PRN

## 2019-02-28 MED ORDER — DEXAMETHASONE SODIUM PHOSPHATE 10 MG/ML IJ SOLN
INTRAMUSCULAR | Status: DC | PRN
  Administered 2019-02-28: 20 mg via INTRAVENOUS

## 2019-02-28 MED ORDER — SUGAMMADEX SODIUM 200 MG/2ML IV SOLN
INTRAVENOUS | Status: DC | PRN
  Administered 2019-02-28: 130.6 mg via INTRAVENOUS

## 2019-02-28 MED ORDER — FAMOTIDINE 20 MG PO TABS: 20.0000 mg | ORAL_TABLET | Freq: Once | ORAL | Status: AC

## 2019-02-28 MED ORDER — NITROGLYCERIN IN D5W 200-5 MCG/ML-% IV SOLN
INTRAVENOUS | Status: AC
  Filled 2019-02-28: qty 250

## 2019-02-28 MED ORDER — LACTATED RINGERS IV SOLN: INTRAVENOUS | Status: DC | PRN

## 2019-02-28 MED ORDER — ROCURONIUM BROMIDE 50 MG/5ML IV SOLN
INTRAVENOUS | Status: AC
  Filled 2019-02-28: qty 1

## 2019-02-28 MED ORDER — PHENOL 1.4 % MT LIQD
1.0000 | OROMUCOSAL | Status: DC | PRN
  Filled 2019-02-28: qty 177

## 2019-02-28 MED ORDER — CHLORHEXIDINE GLUCONATE CLOTH 2 % EX PADS: 6.0000 | MEDICATED_PAD | Freq: Once | CUTANEOUS | Status: DC

## 2019-02-28 MED ORDER — SODIUM CHLORIDE 0.9 % IV SOLN
INTRAVENOUS | Status: DC | PRN
  Administered 2019-02-28: 10 ug via INTRAVENOUS
  Administered 2019-02-28: 30 ug/min via INTRAVENOUS

## 2019-02-28 MED ORDER — PANTOPRAZOLE SODIUM 40 MG IV SOLR
40.0000 mg | Freq: Every day | INTRAVENOUS | Status: DC
  Administered 2019-02-28: 40 mg via INTRAVENOUS
  Filled 2019-02-28: qty 40

## 2019-02-28 MED ORDER — CEFAZOLIN SODIUM-DEXTROSE 2-4 GM/100ML-% IV SOLN
INTRAVENOUS | Status: AC
  Filled 2019-02-28: qty 100

## 2019-02-28 MED ORDER — NALOXONE HCL 0.4 MG/ML IJ SOLN
INTRAMUSCULAR | Status: DC | PRN
  Administered 2019-02-28: 40 ug via INTRAVENOUS

## 2019-02-28 MED ORDER — OXYCODONE HCL 5 MG PO TABS
5.0000 mg | ORAL_TABLET | ORAL | Status: DC | PRN
  Administered 2019-03-03: 5 mg via ORAL
  Filled 2019-02-28: qty 1

## 2019-02-28 MED ORDER — SUCCINYLCHOLINE CHLORIDE 20 MG/ML IJ SOLN
INTRAMUSCULAR | Status: DC | PRN
  Administered 2019-02-28: 100 mg via INTRAVENOUS

## 2019-02-28 MED ORDER — FLUTICASONE PROPIONATE 50 MCG/ACT NA SUSP
2.0000 | Freq: Every day | NASAL | Status: DC
  Administered 2019-03-01 – 2019-03-04 (×4): 2 via NASAL
  Filled 2019-02-28: qty 16

## 2019-02-28 MED ORDER — DOCUSATE SODIUM 100 MG PO CAPS
100.0000 mg | ORAL_CAPSULE | Freq: Every day | ORAL | Status: DC
  Administered 2019-03-01 – 2019-03-04 (×4): 100 mg via ORAL
  Filled 2019-02-28 (×4): qty 1

## 2019-02-28 MED ORDER — PROPOFOL 10 MG/ML IV BOLUS
INTRAVENOUS | Status: AC
  Filled 2019-02-28: qty 20

## 2019-02-28 MED ORDER — LACTATED RINGERS IV SOLN
INTRAVENOUS | Status: DC
  Administered 2019-02-28: 50 mL/h via INTRAVENOUS

## 2019-02-28 MED ORDER — CEFAZOLIN SODIUM-DEXTROSE 2-4 GM/100ML-% IV SOLN
2.0000 g | INTRAVENOUS | Status: AC
  Administered 2019-02-28: 2 g via INTRAVENOUS

## 2019-02-28 MED ORDER — DEXAMETHASONE SODIUM PHOSPHATE 4 MG/ML IJ SOLN
4.0000 mg | Freq: Three times a day (TID) | INTRAMUSCULAR | Status: DC
  Administered 2019-02-28 – 2019-03-01 (×3): 4 mg via INTRAVENOUS
  Filled 2019-02-28 (×3): qty 1

## 2019-02-28 MED ORDER — ONDANSETRON HCL 4 MG/2ML IJ SOLN: 4.0000 mg | Freq: Once | INTRAMUSCULAR | Status: DC | PRN

## 2019-02-28 MED ORDER — EPHEDRINE SULFATE 50 MG/ML IJ SOLN
INTRAMUSCULAR | Status: AC
  Filled 2019-02-28: qty 2

## 2019-02-28 MED ORDER — HEPARIN SODIUM (PORCINE) 5000 UNIT/ML IJ SOLN
INTRAMUSCULAR | Status: AC
  Filled 2019-02-28: qty 1

## 2019-02-28 MED ORDER — SODIUM CHLORIDE 0.9 % IV SOLN
INTRAVENOUS | Status: DC | PRN
  Administered 2019-02-28: 100 mL via INTRAMUSCULAR

## 2019-02-28 MED ORDER — PHENYLEPHRINE HCL (PRESSORS) 10 MG/ML IV SOLN
INTRAVENOUS | Status: AC
  Filled 2019-02-28: qty 1

## 2019-02-28 MED ORDER — PROPOFOL 10 MG/ML IV BOLUS
INTRAVENOUS | Status: DC | PRN
  Administered 2019-02-28: 20 mg via INTRAVENOUS
  Administered 2019-02-28: 80 mg via INTRAVENOUS
  Administered 2019-02-28 (×3): 20 mg via INTRAVENOUS
  Administered 2019-02-28: 100 mg via INTRAVENOUS

## 2019-02-28 MED ORDER — HYDRALAZINE HCL 20 MG/ML IJ SOLN: 5.0000 mg | INTRAMUSCULAR | Status: DC | PRN

## 2019-02-28 MED ORDER — AMLODIPINE BESYLATE 10 MG PO TABS
10.0000 mg | ORAL_TABLET | Freq: Every day | ORAL | Status: DC
  Administered 2019-03-01 – 2019-03-04 (×4): 10 mg via ORAL
  Filled 2019-02-28 (×4): qty 1

## 2019-02-28 MED ORDER — ACETAMINOPHEN 650 MG RE SUPP: 325.0000 mg | RECTAL | Status: DC | PRN

## 2019-02-28 MED ORDER — CEFAZOLIN SODIUM-DEXTROSE 2-4 GM/100ML-% IV SOLN
2.0000 g | Freq: Three times a day (TID) | INTRAVENOUS | Status: AC
  Administered 2019-02-28 (×2): 2 g via INTRAVENOUS
  Filled 2019-02-28 (×2): qty 100

## 2019-02-28 MED ORDER — VASOPRESSIN 20 UNIT/ML IV SOLN
INTRAVENOUS | Status: DC | PRN
  Administered 2019-02-28: 1 [IU] via INTRAVENOUS
  Administered 2019-02-28: .5 [IU] via INTRAVENOUS
  Administered 2019-02-28: 1 [IU] via INTRAVENOUS
  Administered 2019-02-28: .5 [IU] via INTRAVENOUS
  Administered 2019-02-28: 1 [IU] via INTRAVENOUS

## 2019-02-28 MED ORDER — EPHEDRINE SULFATE 50 MG/ML IJ SOLN
INTRAMUSCULAR | Status: DC | PRN
  Administered 2019-02-28 (×2): 5 mg via INTRAVENOUS

## 2019-02-28 MED ORDER — MIDAZOLAM HCL 2 MG/2ML IJ SOLN
INTRAMUSCULAR | Status: AC
  Filled 2019-02-28: qty 2

## 2019-02-28 MED ORDER — GUAIFENESIN-DM 100-10 MG/5ML PO SYRP: 15.0000 mL | ORAL_SOLUTION | ORAL | Status: DC | PRN

## 2019-02-28 MED ORDER — LABETALOL HCL 5 MG/ML IV SOLN: 10.0000 mg | INTRAVENOUS | Status: DC | PRN

## 2019-02-28 MED ORDER — TIOTROPIUM BROMIDE MONOHYDRATE 18 MCG IN CAPS
1.0000 | ORAL_CAPSULE | Freq: Every day | RESPIRATORY_TRACT | Status: DC
  Filled 2019-02-28: qty 5

## 2019-02-28 MED ORDER — NITROGLYCERIN 0.2 MG/ML ON CALL CATH LAB
INTRAVENOUS | Status: DC | PRN
  Administered 2019-02-28 (×3): 20 ug via INTRAVENOUS

## 2019-02-28 MED ORDER — TRAZODONE HCL 50 MG PO TABS
25.0000 mg | ORAL_TABLET | Freq: Every day | ORAL | Status: DC
  Administered 2019-03-02 – 2019-03-03 (×2): 25 mg via ORAL
  Filled 2019-02-28 (×2): qty 1

## 2019-02-28 MED ORDER — ORAL CARE MOUTH RINSE
15.0000 mL | OROMUCOSAL | Status: DC
  Administered 2019-02-28 – 2019-03-01 (×6): 15 mL via OROMUCOSAL

## 2019-02-28 MED ORDER — GLYCOPYRROLATE 0.2 MG/ML IJ SOLN
INTRAMUSCULAR | Status: AC
  Filled 2019-02-28: qty 1

## 2019-02-28 MED ORDER — VASOPRESSIN 20 UNIT/ML IV SOLN
INTRAVENOUS | Status: AC
  Filled 2019-02-28: qty 1

## 2019-02-28 MED ORDER — FENTANYL CITRATE (PF) 100 MCG/2ML IJ SOLN
25.0000 ug | INTRAMUSCULAR | Status: DC | PRN
  Administered 2019-02-28: 25 ug via INTRAVENOUS

## 2019-02-28 MED ORDER — ASPIRIN EC 81 MG PO TBEC
81.0000 mg | DELAYED_RELEASE_TABLET | Freq: Every day | ORAL | Status: DC
  Administered 2019-03-01 – 2019-03-04 (×4): 81 mg via ORAL
  Filled 2019-02-28 (×4): qty 1

## 2019-02-28 MED ORDER — FIBRIN SEALANT 2 ML SINGLE DOSE KIT
PACK | CUTANEOUS | Status: DC | PRN
  Administered 2019-02-28: 2 mL via TOPICAL

## 2019-02-28 MED ORDER — HYDROMORPHONE HCL 1 MG/ML IJ SOLN: 1.0000 mg | Freq: Once | INTRAMUSCULAR | Status: DC | PRN

## 2019-02-28 MED ORDER — SODIUM CHLORIDE 0.9 % IV SOLN: 500.0000 mL | Freq: Once | INTRAVENOUS | Status: DC | PRN

## 2019-02-28 MED ORDER — PHENYLEPHRINE HCL (PRESSORS) 10 MG/ML IV SOLN
INTRAVENOUS | Status: DC | PRN
  Administered 2019-02-28 (×2): 100 ug via INTRAVENOUS

## 2019-02-28 MED ORDER — METOPROLOL TARTRATE 50 MG PO TABS
50.0000 mg | ORAL_TABLET | Freq: Every day | ORAL | Status: DC
  Administered 2019-03-01 – 2019-03-04 (×4): 50 mg via ORAL
  Filled 2019-02-28 (×4): qty 1

## 2019-02-28 SURGICAL SUPPLY — 76 items
APPLIER CLIP 11 MED OPEN (CLIP)
APPLIER CLIP 9.375 SM OPEN (CLIP)
BAG DECANTER FOR FLEXI CONT (MISCELLANEOUS) ×3 IMPLANT
BLADE SURG 15 STRL LF DISP TIS (BLADE) ×2 IMPLANT
BLADE SURG 15 STRL SS (BLADE) ×1
BLADE SURG SZ11 CARB STEEL (BLADE) ×3 IMPLANT
BOOT SUTURE AID YELLOW STND (SUTURE) ×5 IMPLANT
BRUSH SCRUB EZ  4% CHG (MISCELLANEOUS)
BRUSH SCRUB EZ 4% CHG (MISCELLANEOUS) ×2 IMPLANT
CANISTER SUCT 1200ML W/VALVE (MISCELLANEOUS) ×3 IMPLANT
CHLORAPREP W/TINT 26 (MISCELLANEOUS) ×3 IMPLANT
CLIP APPLIE 11 MED OPEN (CLIP) IMPLANT
CLIP APPLIE 9.375 SM OPEN (CLIP) IMPLANT
COVER WAND RF STERILE (DRAPES) ×3 IMPLANT
DERMABOND ADVANCED (GAUZE/BANDAGES/DRESSINGS) ×1
DERMABOND ADVANCED .7 DNX12 (GAUZE/BANDAGES/DRESSINGS) ×2 IMPLANT
DRAPE 3/4 80X56 (DRAPES) ×2 IMPLANT
DRAPE INCISE IOBAN 66X45 STRL (DRAPES) ×2 IMPLANT
DRAPE LAPAROTOMY 100X77 ABD (DRAPES) ×3 IMPLANT
DRESSING SURGICEL FIBRLLR 1X2 (HEMOSTASIS) ×2 IMPLANT
DRSG OPSITE POSTOP 4X6 (GAUZE/BANDAGES/DRESSINGS) ×1 IMPLANT
DRSG SURGICEL FIBRILLAR 1X2 (HEMOSTASIS) ×3
DRSG TEGADERM 4X4.75 (GAUZE/BANDAGES/DRESSINGS) ×1 IMPLANT
ELECT CAUTERY BLADE 6.4 (BLADE) ×3 IMPLANT
ELECT REM PT RETURN 9FT ADLT (ELECTROSURGICAL) ×3
ELECTRODE REM PT RTRN 9FT ADLT (ELECTROSURGICAL) ×2 IMPLANT
GAUZE SPONGE 4X4 12PLY STRL (GAUZE/BANDAGES/DRESSINGS) ×1 IMPLANT
GLOVE BIO SURGEON STRL SZ7 (GLOVE) ×4 IMPLANT
GLOVE INDICATOR 7.5 STRL GRN (GLOVE) ×4 IMPLANT
GLOVE SURG SYN 8.0 (GLOVE) ×3 IMPLANT
GLOVE SURG SYN 8.0 PF PI (GLOVE) ×2 IMPLANT
GOWN STRL REUS W/ TWL LRG LVL3 (GOWN DISPOSABLE) ×4 IMPLANT
GOWN STRL REUS W/ TWL XL LVL3 (GOWN DISPOSABLE) ×2 IMPLANT
GOWN STRL REUS W/TWL LRG LVL3 (GOWN DISPOSABLE) ×2
GOWN STRL REUS W/TWL XL LVL3 (GOWN DISPOSABLE) ×1
HOLDER FOLEY CATH W/STRAP (MISCELLANEOUS) ×2 IMPLANT
IV NS 500ML (IV SOLUTION) ×1
IV NS 500ML BAXH (IV SOLUTION) ×2 IMPLANT
JACKSON PRATT 7MM (INSTRUMENTS) ×1 IMPLANT
KIT TURNOVER KIT A (KITS) ×3 IMPLANT
LABEL OR SOLS (LABEL) ×3 IMPLANT
LOOP RED MAXI  1X406MM (MISCELLANEOUS) ×2
LOOP VESSEL MAXI 1X406 RED (MISCELLANEOUS) ×4 IMPLANT
LOOP VESSEL MINI 0.8X406 BLUE (MISCELLANEOUS) ×2 IMPLANT
LOOPS BLUE MINI 0.8X406MM (MISCELLANEOUS) ×1
NDL FILTER BLUNT 18X1 1/2 (NEEDLE) ×2 IMPLANT
NDL HYPO 25X1 1.5 SAFETY (NEEDLE) ×2 IMPLANT
NEEDLE FILTER BLUNT 18X 1/2SAF (NEEDLE) ×1
NEEDLE FILTER BLUNT 18X1 1/2 (NEEDLE) ×2 IMPLANT
NEEDLE HYPO 25X1 1.5 SAFETY (NEEDLE) ×3 IMPLANT
NS IRRIG 500ML POUR BTL (IV SOLUTION) ×3 IMPLANT
PACK BASIN MAJOR ARMC (MISCELLANEOUS) ×3 IMPLANT
PENCIL ELECTRO HAND CTR (MISCELLANEOUS) IMPLANT
SHUNT CAROTID STR REINF 3.0X4. (MISCELLANEOUS) ×2 IMPLANT
SUT ETHILON 3-0 FS-10 30 BLK (SUTURE) ×3
SUT ETHILON 4-0 (SUTURE) ×1
SUT ETHILON 4-0 FS2 18XMFL BLK (SUTURE) ×2
SUT MNCRL+ 5-0 UNDYED PC-3 (SUTURE) ×2 IMPLANT
SUT MONOCRYL 5-0 (SUTURE) ×1
SUT PROLENE 6 0 BV (SUTURE) ×18 IMPLANT
SUT PROLENE 7 0 BV 1 (SUTURE) ×12 IMPLANT
SUT SILK 2 0 (SUTURE) ×1
SUT SILK 2-0 18XBRD TIE 12 (SUTURE) ×2 IMPLANT
SUT SILK 3 0 (SUTURE) ×1
SUT SILK 3-0 18XBRD TIE 12 (SUTURE) ×2 IMPLANT
SUT SILK 4 0 (SUTURE) ×1
SUT SILK 4-0 18XBRD TIE 12 (SUTURE) ×2 IMPLANT
SUT VIC AB 3-0 SH 27 (SUTURE) ×1
SUT VIC AB 3-0 SH 27X BRD (SUTURE) ×2 IMPLANT
SUTURE EHLN 3-0 FS-10 30 BLK (SUTURE) IMPLANT
SUTURE ETHLN 4-0 FS2 18XMF BLK (SUTURE) IMPLANT
SYR 10ML LL (SYRINGE) ×3 IMPLANT
SYR 20ML LL LF (SYRINGE) ×3 IMPLANT
SYR 3ML LL SCALE MARK (SYRINGE) ×3 IMPLANT
TRAY FOLEY MTR SLVR 16FR STAT (SET/KITS/TRAYS/PACK) ×2 IMPLANT
TUBING CONNECTING 10 (TUBING) IMPLANT

## 2019-02-28 SURGICAL SUPPLY — 67 items
APPLIER CLIP 11 MED OPEN (CLIP)
APPLIER CLIP 9.375 SM OPEN (CLIP)
BAG DECANTER FOR FLEXI CONT (MISCELLANEOUS) ×2 IMPLANT
BLADE SURG 15 STRL LF DISP TIS (BLADE) ×1 IMPLANT
BLADE SURG 15 STRL SS (BLADE) ×1
BLADE SURG SZ11 CARB STEEL (BLADE) ×2 IMPLANT
BOOT SUTURE AID YELLOW STND (SUTURE) ×4 IMPLANT
BRUSH SCRUB EZ  4% CHG (MISCELLANEOUS) ×1
BRUSH SCRUB EZ 4% CHG (MISCELLANEOUS) ×1 IMPLANT
CANISTER SUCT 1200ML W/VALVE (MISCELLANEOUS) ×2 IMPLANT
CHLORAPREP W/TINT 26 (MISCELLANEOUS) ×2 IMPLANT
CLIP APPLIE 11 MED OPEN (CLIP) IMPLANT
CLIP APPLIE 9.375 SM OPEN (CLIP) IMPLANT
COVER WAND RF STERILE (DRAPES) ×2 IMPLANT
DERMABOND ADVANCED (GAUZE/BANDAGES/DRESSINGS) ×1
DERMABOND ADVANCED .7 DNX12 (GAUZE/BANDAGES/DRESSINGS) ×1 IMPLANT
DRAPE 3/4 80X56 (DRAPES) ×2 IMPLANT
DRAPE INCISE IOBAN 66X45 STRL (DRAPES) ×2 IMPLANT
DRAPE LAPAROTOMY 100X77 ABD (DRAPES) ×2 IMPLANT
DRESSING SURGICEL FIBRLLR 1X2 (HEMOSTASIS) ×1 IMPLANT
DRSG SURGICEL FIBRILLAR 1X2 (HEMOSTASIS) ×2
ELECT CAUTERY BLADE 6.4 (BLADE) ×2 IMPLANT
ELECT REM PT RETURN 9FT ADLT (ELECTROSURGICAL) ×2
ELECTRODE REM PT RTRN 9FT ADLT (ELECTROSURGICAL) ×1 IMPLANT
GLOVE BIO SURGEON STRL SZ7 (GLOVE) ×3 IMPLANT
GLOVE INDICATOR 7.5 STRL GRN (GLOVE) ×3 IMPLANT
GLOVE SURG SYN 8.0 (GLOVE) ×2 IMPLANT
GLOVE SURG SYN 8.0 PF PI (GLOVE) ×1 IMPLANT
GOWN STRL REUS W/ TWL LRG LVL3 (GOWN DISPOSABLE) ×2 IMPLANT
GOWN STRL REUS W/ TWL XL LVL3 (GOWN DISPOSABLE) ×1 IMPLANT
GOWN STRL REUS W/TWL LRG LVL3 (GOWN DISPOSABLE) ×2
GOWN STRL REUS W/TWL XL LVL3 (GOWN DISPOSABLE) ×1
HOLDER FOLEY CATH W/STRAP (MISCELLANEOUS) ×2 IMPLANT
IV NS 500ML (IV SOLUTION) ×1
IV NS 500ML BAXH (IV SOLUTION) ×1 IMPLANT
KIT TURNOVER KIT A (KITS) ×2 IMPLANT
LABEL OR SOLS (LABEL) ×2 IMPLANT
LOOP RED MAXI  1X406MM (MISCELLANEOUS) ×2
LOOP VESSEL MAXI 1X406 RED (MISCELLANEOUS) ×2 IMPLANT
LOOP VESSEL MINI 0.8X406 BLUE (MISCELLANEOUS) ×1 IMPLANT
LOOPS BLUE MINI 0.8X406MM (MISCELLANEOUS) ×1
NDL FILTER BLUNT 18X1 1/2 (NEEDLE) ×1 IMPLANT
NDL HYPO 25X1 1.5 SAFETY (NEEDLE) ×1 IMPLANT
NEEDLE FILTER BLUNT 18X 1/2SAF (NEEDLE) ×1
NEEDLE FILTER BLUNT 18X1 1/2 (NEEDLE) ×1 IMPLANT
NEEDLE HYPO 25X1 1.5 SAFETY (NEEDLE) ×2 IMPLANT
NS IRRIG 500ML POUR BTL (IV SOLUTION) ×2 IMPLANT
PACK BASIN MAJOR ARMC (MISCELLANEOUS) ×2 IMPLANT
PENCIL ELECTRO HAND CTR (MISCELLANEOUS) ×1 IMPLANT
SHUNT CAROTID STR REINF 3.0X4. (MISCELLANEOUS) ×2 IMPLANT
SUT MNCRL+ 5-0 UNDYED PC-3 (SUTURE) ×1 IMPLANT
SUT MONOCRYL 5-0 (SUTURE) ×1
SUT PROLENE 6 0 BV (SUTURE) ×21 IMPLANT
SUT PROLENE 7 0 BV 1 (SUTURE) ×10 IMPLANT
SUT SILK 2 0 (SUTURE) ×1
SUT SILK 2-0 18XBRD TIE 12 (SUTURE) ×1 IMPLANT
SUT SILK 3 0 (SUTURE) ×1
SUT SILK 3-0 18XBRD TIE 12 (SUTURE) ×1 IMPLANT
SUT SILK 4 0 (SUTURE) ×1
SUT SILK 4-0 18XBRD TIE 12 (SUTURE) ×1 IMPLANT
SUT VIC AB 3-0 SH 27 (SUTURE) ×1
SUT VIC AB 3-0 SH 27X BRD (SUTURE) ×1 IMPLANT
SYR 10ML LL (SYRINGE) ×1 IMPLANT
SYR 20ML LL LF (SYRINGE) ×2 IMPLANT
SYR 3ML LL SCALE MARK (SYRINGE) ×2 IMPLANT
TRAY FOLEY MTR SLVR 16FR STAT (SET/KITS/TRAYS/PACK) ×2 IMPLANT
TUBING CONNECTING 10 (TUBING) IMPLANT

## 2019-02-28 NOTE — Anesthesia Preprocedure Evaluation (Addendum)
Anesthesia Evaluation  Patient identified by MRN, date of birth, ID band Patient awake    Reviewed: Allergy & Precautions, NPO status , Patient's Chart, lab work & pertinent test results  Airway Mallampati: III       Dental   Pulmonary COPD, former smoker,           Cardiovascular hypertension, Pt. on medications + Peripheral Vascular Disease       Neuro/Psych PSYCHIATRIC DISORDERS Depression TIACVA    GI/Hepatic Neg liver ROS, GERD  ,  Endo/Other  negative endocrine ROS  Renal/GU negative Renal ROS     Musculoskeletal  (+) Arthritis , Osteoarthritis,    Abdominal   Peds negative pediatric ROS (+)  Hematology negative hematology ROS (+)   Anesthesia Other Findings Past Medical History: No date: Allergies     Comment:  hay fever No date: Arthritis 2015: Breast cancer, left ([facility])     Comment:  Left total mastectomy No date: COPD (chronic obstructive pulmonary disease) ([facility]) No date: GERD (gastroesophageal reflux disease) No date: Hyperlipemia No date: Hypertension 2002: Stroke [facility])     Comment:  TIA  Reproductive/Obstetrics                             Anesthesia Physical  Anesthesia Plan  ASA: III  Anesthesia Plan: General   Post-op Pain Management:    Induction: Intravenous, Cricoid pressure planned and Rapid sequence  PONV Risk Score and Plan:   Airway Management Planned: Oral ETT and Video Laryngoscope Planned  Additional Equipment: Arterial line  Intra-op Plan:   Post-operative Plan: Post-operative intubation/ventilation  Informed Consent: I have reviewed the patients History and Physical, chart, labs and discussed the procedure including the risks, benefits and alternatives for the proposed anesthesia with the patient or authorized representative who has indicated his/her understanding and acceptance.     Dental advisory given  Plan Discussed with: CRNA and  Surgeon  Anesthesia Plan Comments: (Emergent bring back for postop bleed and difficulty with swallowing.)       Anesthesia Quick Evaluation

## 2019-02-28 NOTE — Anesthesia Preprocedure Evaluation (Signed)
Anesthesia Evaluation  Patient identified by MRN, date of birth, ID band Patient awake    Reviewed: Allergy & Precautions, NPO status , Patient's Chart, lab work & pertinent test results  Airway Mallampati: III       Dental   Pulmonary COPD, former smoker,           Cardiovascular hypertension, Pt. on medications + Peripheral Vascular Disease       Neuro/Psych PSYCHIATRIC DISORDERS Depression TIACVA    GI/Hepatic Neg liver ROS, GERD  ,  Endo/Other  negative endocrine ROS  Renal/GU negative Renal ROS     Musculoskeletal  (+) Arthritis , Osteoarthritis,    Abdominal   Peds negative pediatric ROS (+)  Hematology negative hematology ROS (+)   Anesthesia Other Findings Past Medical History: No date: Allergies     Comment:  hay fever No date: Arthritis 2015: Breast cancer, left (HCC)     Comment:  Left total mastectomy No date: COPD (chronic obstructive pulmonary disease) (H[facility]) No date: GERD (gastroesophageal reflux disease) No date: Hyperlipemia No date: Hypertension 2002: Stroke [facility])     Comment:  TIA  Reproductive/Obstetrics                             Anesthesia Physical Anesthesia Plan  ASA: III  Anesthesia Plan: General   Post-op Pain Management:    Induction: Intravenous  PONV Risk Score and Plan:   Airway Management Planned: Oral ETT  Additional Equipment: Arterial line  Intra-op Plan:   Post-operative Plan: Extubation in OR  Informed Consent: I have reviewed the patients History and Physical, chart, labs and discussed the procedure including the risks, benefits and alternatives for the proposed anesthesia with the patient or authorized representative who has indicated his/her understanding and acceptance.     Dental advisory given  Plan Discussed with: CRNA and Surgeon  Anesthesia Plan Comments:         Anesthesia Quick Evaluation

## 2019-02-28 NOTE — H&P (Signed)
[facility] History & Physical Update  The patient was interviewed and re-examined.  The patient's previous History and Physical has been reviewed and is unchanged.  There is no change in the plan of care. We plan to proceed with the scheduled procedure.  [name], MD  02/28/2019, 7:27 AM

## 2019-02-28 NOTE — Progress Notes (Signed)
Pt becoming agitated and states struggling to breath. Dr. Kayleen Memos at bedside.

## 2019-02-28 NOTE — Transfer of Care (Signed)
Immediate Anesthesia Transfer of Care Note  Patient: Natalie [name]  Procedure(s) Performed: ENDARTERECTOMY CAROTID (Left )  Patient Location: PACU  Anesthesia Type:General  Level of Consciousness: awake, alert  and oriented  Airway & Oxygen Therapy: Patient Spontanous Breathing and Patient connected to face mask oxygen  Post-op Assessment: Report given to RN and Post -op Vital signs reviewed and stable  Post vital signs: Reviewed and stable  Last Vitals:  Vitals Value Taken Time  BP 158/66 02/28/19 1057  Temp 36.2 C 02/28/19 1057  Pulse 78 02/28/19 1109  Resp 13 02/28/19 1057  SpO2 96 % 02/28/19 1109  Vitals shown include unvalidated device data.  Last Pain:  Vitals:   02/28/19 1057  TempSrc:   PainSc: Asleep         Complications: No apparent anesthesia complications

## 2019-02-28 NOTE — Anesthesia Procedure Notes (Signed)
Procedure Name: Intubation Date/Time: 02/28/2019 7:50 AM Performed by: Allean Found, CRNA Pre-anesthesia Checklist: Patient identified, Patient being monitored, Timeout performed, Emergency Drugs available and Suction available Patient Re-evaluated:Patient Re-evaluated prior to induction Oxygen Delivery Method: Circle system utilized Preoxygenation: Pre-oxygenation with 100% oxygen Induction Type: IV induction Ventilation: Mask ventilation without difficulty Laryngoscope Size: 3 and McGraph Grade View: Grade I Tube type: Oral Tube size: 7.0 mm Number of attempts: 1 Airway Equipment and Method: Stylet Placement Confirmation: ETT inserted through vocal cords under direct vision,  positive ETCO2 and breath sounds checked- equal and bilateral Secured at: 21 cm Tube secured with: Tape Dental Injury: Teeth and Oropharynx as per pre-operative assessment

## 2019-02-28 NOTE — Op Note (Addendum)
[facility]   OPERATIVE NOTE  PROCEDURE:   1.  Evacuation left neck hematoma status post left carotid endarterectomy  PRE-OPERATIVE DIAGNOSIS: 1.  Expanding hematoma left neck  2. critical carotid stenosis status post left carotid endarterectomy  POST-OPERATIVE DIAGNOSIS: same as above   SURGEON: Katha Cabal, MD  ASSISTANT(S): Ms. Hezzie Bump  ANESTHESIA: general  ESTIMATED BLOOD LOSS: Less than 10 cc  FINDING(S): 1.  Hematoma  SPECIMEN(S): No specimen sent to pathology  INDICATIONS:   Natalie [name] is a 85 y.o. y.o. female who presents with an enlarging hematoma of the left neck.  She is status post left carotid endarterectomy earlier today secondary to left internal carotid stenosis of 95%.  The risks and benefits were discussed with the patient's daughter.   DESCRIPTION: The patient, the patient was brought back to the operating room and placed supine upon the operating table.  Prior to induction, the patient received IV antibiotics.  After obtaining adequate anesthesia, the patient was placed a supine position with a shoulder roll in place and the patient's neck slightly hyperextended and rotated away from the surgical site.  The patient was prepped in the standard fashion for a carotid endarterectomy.    A first assistant was required to provide a safe and appropriate environment for executing the surgery.  The assistant was integral in providing retraction, exposure, running suture providing suction and in the closing process.  The carotid endarterectomy incision was then reopened.  As the suture was removed from the platysmal layer significant amount of hematoma was encountered.  The hematoma and Surgicel is then evacuated manually under direct visualization.  Approximately 500 cc of sterile saline is then used to irrigate the wound.  Initial inspection is made some small bleeding points are encountered in the subcutaneous tissues and  these are controlled Bovie cautery.  The wound is then packed with 4 x 4 gauze.  After several minutes the gauze is removed and inspection again performed.  No obvious bleeding source is identified.  The carotid repair is intact there is no bleeding from the suture line identified.  4 x 4 gauzes again returned light pressure is held and after several minutes it is removed again no bleeding is identified.  Fibrillar is then placed around the carotid and 2 cc of Vistaseal is applied.  Some dry fibrillar was placed on top of this and again 4 x 4's are placed into the wound and light pressure held for several minutes.  Follow-up inspection demonstrates hemostasis no bleeding.  JP drain is delivered through an inferior stab incision and laid in the bed of the wound.  The platysma was then reapproximated with a running 3-0 Vicryl and the skin is reapproximated with interrupted 4-0 nylon.  Sterile dressing was applied and the drain is hooked to grenade suction.  COMPLICATIONS: none  CONDITION: stable  [name] 02/28/2019<2:21 PM

## 2019-02-28 NOTE — Progress Notes (Signed)
Dr. Carroll Kinds notifed of pt neck swelling. Dr. Kayleen Memos also notified of pt neck swelling. Both Dr. Delana Meyer and Dr. Kayleen Memos at bedside. No new order at this time. Continue to monitor swelling.

## 2019-02-28 NOTE — Anesthesia Post-op Follow-up Note (Signed)
Anesthesia QCDR form completed.        

## 2019-02-28 NOTE — Op Note (Signed)
[facility]   OPERATIVE NOTE  PROCEDURE:   1. Left carotid endarterectomy with Primary Closure  PRE-OPERATIVE DIAGNOSIS: 1.  Critical stenosis of the left carotid artery associated with a high-grade kink more distally 2. History of TIA  POST-OPERATIVE DIAGNOSIS: same as above   SURGEON: Katha Cabal, MD  ASSISTANT(S): Ms. Hezzie Bump  ANESTHESIA: general  ESTIMATED BLOOD LOSS: 100 cc  FINDING(S): 1.  Extensive calcified carotid plaque.  SPECIMEN(S):  Carotid plaque (sent to Pathology)  INDICATIONS:   Natalie [name] is a 85 y.o. y.o. female who presents with left carotid stenosis of greater than 95%.  The risks, benefits, and alternatives to carotid endarterectomy were discussed with the patient. The differences between carotid stenting and carotid endarterectomy were reviewed.  The patient voiced understanding and appears to be aware that the risks of carotid endarterectomy include but are not limited to: bleeding, infection, stroke, myocardial infarction, death, cranial nerve injuries both temporary and permanent, neck hematoma, possible airway compromise, labile blood pressure post-operatively, cerebral hyperperfusion syndrome, and possible need for additional interventions in the future. The patient is aware of the risks and agrees to proceed forward with the procedure.  DESCRIPTION: After full informed written consent was obtained from the patient, the patient was brought back to the operating room and placed supine upon the operating table.  Prior to induction, the patient received IV antibiotics.  After obtaining adequate anesthesia, the patient was placed a supine position with a shoulder roll in place and the patient's neck slightly hyperextended and rotated away from the surgical site.  The patient was prepped in the standard fashion for a carotid endarterectomy.    A first assistant was required to provide a safe and appropriate environment  for executing the surgery.  The assistant was integral in providing retraction, exposure, running suture providing suction and in the closing process.  The skin incision was made in the left neck anterior to the sternocleidomastoid muscle and dissected down through the subcutaneous tissue.  The platysmas was opened with electrocautery.  The internal jugular vein and facial vein were identified.  The facial vein is ligated and divided between 2-0 silk ties.  The omohyoid was identified in the common carotid artery exposed at this level. The dissection was there in carried out along the carotid artery in a cranial direction.  The dissection was then carried along periadventitial plane along the common carotid artery up to the bifurcation. The external carotid artery was identified. Vessel loops were then placed around the external carotid artery as well as the superior thyroid artery. In the process of this dissection, the hypoglossal nerve was identified and protected from harm.  The internal carotid artery was then dissected circumferentially just beyond an area in the internal carotid artery distal to the plaque.    At this point, we gave the patient 7000 units of intravenous heparin and this was allowed to circulate for several minutes.  The common carotid artery followed by the external carotid and then the internal carotid artery were clamped.  Arteriotomy was made in the common carotid artery with a 11 blade, and extended the arteriotomy with a Potts scissor down into the common carotid artery, then the arteriotomy was carried through the bifurcation into the internal carotid artery until I reached an area that was not diseased.  At this point, a Sundt shunt was placed without difficulty.  The endarterectomy was begun in the common carotid artery with a Garment/textile technologist and carried this  dissection down into the common carotid artery circumferentially.  Then I transected the plaque at a segment where it  was adherent and transected the plaque with Potts scissors.  I then carried this dissection up into the external carotid artery.  The plaque was extracted by unclamping the external carotid artery and performing an eversion endarterectomy.  The dissection was then carried into the internal carotid artery where a  feathered end point was created.  The plaque was passed off the field as a specimen.  The distal endpoint was tacked down with 6 interrupted 7-0 Prolene sutures.    The proximal internal carotid artery was then imbricated shortening the artery by approximately 15 mm using 7 interrupted 6-0 Prolene sutures.  The arteriotomy was then closed using running 6-0 Prolene beginning at the distal margin and running the second 6-0 Prolene from the common carotid end.  Prior to completing the primary repair, the shunt was removed, the internal carotid artery was flushed and there was excellent backbleeding.  The carotid artery repair was flushed with heparinized saline and then the primary repair was completed in the usual fashion.  The flow was then reestablished first to the external carotid artery and then the internal carotid artery to prevent distal embolization.   Several minutes of pressure were held and 6-0 Prolene sutures were used as need for hemostasis.  At this point, I placed Surgicel and Evicel topical hemostatic agents.  There was no more active bleeding in the surgical site.  The sternocleidomastoid space was closed with three interrupted 3-0 Vicryl sutures. I then reapproximated the platysma muscle with a running stitch of 3-0 Vicryl.  The skin was then closed with a running subcuticular 4-0 Monocryl.  The skin was then cleaned, dried and Dermabond was used to reinforce the skin closure.  The patient awakened and was taken to the recovery room in stable condition, following commands and moving all four extremities without any apparent deficits.    COMPLICATIONS: none  CONDITION:  stable  Natalie [name] 02/28/2019<10:12 AM

## 2019-02-28 NOTE — Anesthesia Procedure Notes (Signed)
Procedure Name: Intubation Date/Time: 02/28/2019 1:13 PM Performed by: Alvin Critchley, MD Pre-anesthesia Checklist: Patient identified, Patient being monitored, Timeout performed, Emergency Drugs available and Suction available Patient Re-evaluated:Patient Re-evaluated prior to induction Oxygen Delivery Method: Circle system utilized Preoxygenation: Pre-oxygenation with 100% oxygen Induction Type: IV induction Ventilation: Mask ventilation without difficulty Laryngoscope Size: 3 and McGraph Grade View: Grade IV Tube type: Oral Tube size: 7.0 mm Number of attempts: 2 (SRNA X 1) Airway Equipment and Method: Stylet and Bougie stylet Placement Confirmation: ETT inserted through vocal cords under direct vision,  positive ETCO2 and breath sounds checked- equal and bilateral Secured at: 21 cm Tube secured with: Tape Dental Injury: Teeth and Oropharynx as per pre-operative assessment  Comments: Airway distorted, trachea deviated to the right, difficult visualization.

## 2019-02-28 NOTE — Consult Note (Signed)
Reason for Consult: Assistance with ventilator management Referring Physician: [name], MD  Natalie [name] is an 85 y.o. female.  HPI: 85 year old former smoker presented for carotid endarterectomy in the setting of symptomatic critical LEFT carotid occlusion.  The patient underwent carotid endarterectomy without incident.  She was in the PACU when it was noted that her neck started to swell and she developed tracheal deviation.  She required reintubation and to go back to the operating room for evacuation of a left neck hematoma.  There was no obvious source of bleeding.  She is now admitted to the intensive care unit intubated and mechanically ventilated we are asked to manage her ventilator.    Past Medical History:  Diagnosis Date  . Allergies    hay fever  . Arthritis   . Breast cancer, left ([place]) 2015   Left total mastectomy  . COPD (chronic obstructive pulmonary disease) ([place])   . GERD (gastroesophageal reflux disease)   . Hyperlipemia   . Hypertension   . Stroke [facility]) 2002   TIA    Past Surgical History:  Procedure Laterality Date  . BREAST BIOPSY  2014  . BREAST LUMPECTOMY  2014   left side  . EYE SURGERY Bilateral    cataract extractions  . JOINT REPLACEMENT    . MASTECTOMY Left May 9th 2017  . RHINOPLASTY  1956  . TONSILLECTOMY  1939  . TOTAL HIP ARTHROPLASTY Right    head of the femur replaced due to fracture    Family History  Problem Relation Age of Onset  . Breast cancer Mother   . Lung cancer Father     Social History:  reports that she has quit smoking. She has never used smokeless tobacco. She reports current alcohol use. She reports that she does not use drugs.  Allergies:  Allergies  Allergen Reactions  . Influenza Vaccines Shortness Of Breath    Medications: I have reviewed the patient's current medications.  Results for orders placed or performed during the hospital encounter of 02/28/19 (from the past 48 hour(s))  ABO/Rh      Status: None   Collection Time: 02/28/19  6:24 AM  Result Value Ref Range   ABO/RH(D)      A POS Performed at [facility], [address] [place], [place] [address]   Glucose, capillary     Status: Abnormal   Collection Time: 02/28/19  2:53 PM  Result Value Ref Range   Glucose-Capillary 242 (H) 70 - 99 mg/dL    No results found.  Review of Systems  Unable to perform ROS: Intubated   Blood pressure 118/87, pulse 65, temperature (!) 97.1 F (36.2 C), resp. rate 17, height 5\' 5"  (1.651 m), weight 65.3 kg, SpO2 100 %. Physical Exam  Constitutional: She appears well-developed and well-nourished.  HENT:  Head: Normocephalic and atraumatic.  Right Ear: External ear normal.  Left Ear: External ear normal.  Nose: Nose normal.  Orotracheally intubated, crowded airway.  Moist mucous membranes.  Eyes: Pupils are equal, round, and reactive to light. Conjunctivae are normal. No scleral icterus.  Neck: Tracheal deviation present.  Massive edema of the neck, JP drain in place.  Left endarterectomy incision.  No air leak when ETT pilot balloon deflated.  Cardiovascular: Normal rate, regular rhythm and normal heart sounds.  No murmur heard. Respiratory: Effort normal. She has no wheezes. She has no rales.  Coarse breath sounds, synchronous with the ventilator  GI: Soft. Bowel sounds are normal. She exhibits  no distension.  No grimacing on palpation  Musculoskeletal:        General: No deformity or edema.     Cervical back: Neck supple. Edema present.  Neurological:  Sedated, moves all fours spontaneously  Skin: Skin is warm and dry.  Psychiatric:  Cannot assess sedated, on vent.    Assessment/Plan:  Acute respiratory failure due to upper airway obstruction Upper airway obstruction due to post endarterectomy hematoma She has undergone drainage Still with significant edema, JP drain in place Decadron for edema Monitor for air leak prior to extubation Remain intubated  day AVOID ACE INHIBITORS SBT/SAT as tolerates Remains sedated for ventilator comfort tonight  COPD without exacerbation DuoNebs as needed Pulmonary toilet postextubation (IS/Acapella)  Critical carotid stenosis Peripheral vascular disease Status post endarterectomy Postop care per Dr. Delana Meyer  Diastolic Dysfunction No evidence of decompensation Monitor  Best practice: DVT prophylaxis: SCDs, avoid anticoagulants due to post surgical hematoma GI prophylaxis: Protonix Antibiotic prophylaxis: Ancef per surgical protocol Postsurgical hyperglycemia protocol   Critical care time: 60 minutes.   Renold Don, MD [facility] 02/28/2019, 3:10 PM

## 2019-02-28 NOTE — Progress Notes (Signed)
Pt taken back to OR

## 2019-02-28 NOTE — Progress Notes (Signed)
Dr. Delana Meyer at bedside. States hematoma to neck needs to be drained. Pt will be going back to OR.

## 2019-02-28 NOTE — Progress Notes (Signed)
Arterial line kinked and leaking. Dr. Delana Meyer notifed. Ok'd to remove arterial line.

## 2019-02-28 NOTE — Transfer of Care (Signed)
Immediate Anesthesia Transfer of Care Note  Patient: Natalie [name]  Procedure(s) Performed: EVACUATION OF HEMATOMA (Left )  Patient Location: PACU and ICU  Anesthesia Type:General  Level of Consciousness: sedated and Patient remains intubated per anesthesia plan  Airway & Oxygen Therapy: Patient remains intubated per anesthesia plan  Post-op Assessment: Report given to RN and Post -op Vital signs reviewed and stable  Post vital signs: Reviewed and stable  Last Vitals:  Vitals Value Taken Time  BP    Temp    Pulse 65 02/28/19 1455  Resp 17 02/28/19 1455  SpO2 100 % 02/28/19 1455    Last Pain:  Vitals:   02/28/19 1203  TempSrc:   PainSc: 4          Complications: No apparent anesthesia complications

## 2019-03-01 DIAGNOSIS — S1093XD Contusion of unspecified part of neck, subsequent encounter: Secondary | ICD-10-CM

## 2019-03-01 LAB — GLUCOSE, CAPILLARY
Glucose-Capillary: 224 mg/dL — ABNORMAL HIGH (ref 70–99)
Glucose-Capillary: 112 mg/dL — ABNORMAL HIGH (ref 70–99)
Glucose-Capillary: 132 mg/dL — ABNORMAL HIGH (ref 70–99)

## 2019-03-01 LAB — BASIC METABOLIC PANEL
Anion gap: 9 (ref 5–15)
BUN: 17 mg/dL (ref 8–23)
CO2: 22 mmol/L (ref 22–32)
Calcium: 8.5 mg/dL — ABNORMAL LOW (ref 8.9–10.3)
Chloride: 102 mmol/L (ref 98–111)
Creatinine, Ser: 1.04 mg/dL — ABNORMAL HIGH (ref 0.44–1.00)
GFR calc Af Amer: 56 mL/min — ABNORMAL LOW (ref >60)
GFR calc non Af Amer: 48 mL/min — ABNORMAL LOW (ref >60)
Glucose, Bld: 281 mg/dL — ABNORMAL HIGH (ref 70–99)
Potassium: 4.7 mmol/L (ref 3.5–5.1)
Sodium: 133 mmol/L — ABNORMAL LOW (ref 135–145)

## 2019-03-01 LAB — CBC
HCT: 27 % — ABNORMAL LOW (ref 36.0–46.0)
Hemoglobin: 9.5 g/dL — ABNORMAL LOW (ref 12.0–15.0)
MCH: 31.1 pg (ref 26.0–34.0)
MCHC: 35.2 g/dL (ref 30.0–36.0)
MCV: 88.5 fL (ref 80.0–100.0)
Platelets: 158 10*3/uL (ref 150–400)
RBC: 3.05 MIL/uL — ABNORMAL LOW (ref 3.87–5.11)
RDW: 12.6 % (ref 11.5–15.5)
WBC: 15 10*3/uL — ABNORMAL HIGH (ref 4.0–10.5)
nRBC: 0 % (ref 0.0–0.2)

## 2019-03-01 LAB — PHOSPHORUS: Phosphorus: 2.9 mg/dL (ref 2.5–4.6)

## 2019-03-01 LAB — HEMOGLOBIN A1C
Hgb A1c MFr Bld: 6.3 % — ABNORMAL HIGH (ref 4.8–5.6)
Mean Plasma Glucose: 134.11 mg/dL

## 2019-03-01 LAB — MAGNESIUM: Magnesium: 1.8 mg/dL (ref 1.7–2.4)

## 2019-03-01 MED ORDER — PANTOPRAZOLE SODIUM 40 MG PO TBEC
40.0000 mg | DELAYED_RELEASE_TABLET | Freq: Every day | ORAL | Status: DC
  Administered 2019-03-02 – 2019-03-04 (×3): 40 mg via ORAL
  Filled 2019-03-01 (×3): qty 1

## 2019-03-01 MED ORDER — INSULIN ASPART 100 UNIT/ML ~~LOC~~ SOLN
0.0000 [IU] | Freq: Three times a day (TID) | SUBCUTANEOUS | Status: DC
  Administered 2019-03-01: 3 [IU] via SUBCUTANEOUS
  Administered 2019-03-02 – 2019-03-04 (×6): 1 [IU] via SUBCUTANEOUS
  Administered 2019-03-04: 2 [IU] via SUBCUTANEOUS
  Filled 2019-03-01 (×8): qty 1

## 2019-03-01 MED ORDER — ORAL CARE MOUTH RINSE
15.0000 mL | Freq: Two times a day (BID) | OROMUCOSAL | Status: DC
  Administered 2019-03-01 – 2019-03-04 (×7): 15 mL via OROMUCOSAL

## 2019-03-01 MED ORDER — IPRATROPIUM-ALBUTEROL 0.5-2.5 (3) MG/3ML IN SOLN: 3.0000 mL | RESPIRATORY_TRACT | Status: DC | PRN

## 2019-03-01 MED ORDER — PANTOPRAZOLE SODIUM 40 MG PO PACK: 40.0000 mg | PACK | Freq: Every day | ORAL | Status: DC

## 2019-03-01 MED ORDER — PANTOPRAZOLE SODIUM 40 MG PO TBEC: 40.0000 mg | DELAYED_RELEASE_TABLET | Freq: Every day | ORAL | Status: DC

## 2019-03-01 NOTE — Progress Notes (Signed)
Patient extubated to 4L nasal cannula.  Patient vitals stable.

## 2019-03-01 NOTE — Evaluation (Signed)
Clinical/Bedside Swallow Evaluation Patient Details  Name: Natalie [name] MRN: [id number] Date of Birth: 30-Oct-1929  Today's Date: 03/01/2019 Time: SLP Start Time (ACUTE ONLY): [id number] SLP Stop Time (ACUTE ONLY): 1808 SLP Time Calculation (min) (ACUTE ONLY): 38 min  Past Medical History:  Past Medical History:  Diagnosis Date  . Allergies    hay fever  . Arthritis   . Breast cancer, left ([place]) 2015   Left total mastectomy  . COPD (chronic obstructive pulmonary disease) ([place])   . GERD (gastroesophageal reflux disease)   . Hyperlipemia   . Hypertension   . Stroke [facility]) 2002   TIA   Past Surgical History:  Past Surgical History:  Procedure Laterality Date  . BREAST BIOPSY  2014  . BREAST LUMPECTOMY  2014   left side  . EYE SURGERY Bilateral    cataract extractions  . JOINT REPLACEMENT    . MASTECTOMY Left May 9th 2017  . RHINOPLASTY  1956  . TONSILLECTOMY  1939  . TOTAL HIP ARTHROPLASTY Right    head of the femur replaced due to fracture   HPI:    Endarterectomy without immediate complications. Swelling with hematoma noted in PACU resulting in reintubation for evacuation of hematoma.  Assessment / Plan / Recommendation Clinical Impression  Bedside swallow eval today revealed significant dysphagia with high risk of aspiration with PO's. Pt was extubated this morning. She recently underwent carotid endarterectomy without complications, but later required reintubation and to go back to the operating room for evacuation of a left neck hematoma. This evening, Pt was alert and pleasant, seated upright in a chair. She presented with coughing up of phlegm prior to any PO's and seemed to have trouble clearing her saliva. Voice is breathy and hoarse but appropriate and fluent. Oral mech exam revealed structures to be functioning adequately without apparent weakness. Noted bruised tongue and swelling in the neck. Pt presented with throat clearing and weak cough after small bolus of  applesauce, thin liquid, and nectar thick liquid. Pt did tolerate a small piece of ice without difficulty. Rec NPO at this time secondary to high risk of aspiration. Ice chips in moderation are OK. ST plans to reassess as hopefully Pt will demonstrate improvement over the next 24-48 hours as swelling decreases. Pt. may need a modified barium swallow study Monday to further assess if she doesn't show improvement in swallowing at bedside. Discussed with Nsg, and Pt. MD also made aware of plans and recommendations. SLP Visit Diagnosis: Dysphagia, oropharyngeal phase (R13.12)    Aspiration Risk  Severe aspiration risk    Diet Recommendation NPO;Ice chips PRN after oral care        Other  Recommendations Recommended Consults: (ENT evaluation if no improvemetn in 24 hours)   Follow up Recommendations   ST to reassess in hopes of improvement     Frequency and Duration   Reassess in 24-48 hours         Prognosis    fair    Swallow Study   General Type of Study: Bedside Swallow Evaluation Diet Prior to this Study: Regular Temperature Spikes Noted: No History of Recent Intubation: Yes Length of Intubations (days): 1 days Date extubated: 03/01/19 Behavior/Cognition: Alert;Cooperative;Pleasant mood Oral Cavity Assessment: Within Functional Limits;Other (comment)(Bruised tongue) Vision: Functional for self-feeding Self-Feeding Abilities: Able to feed self Patient Positioning: Upright in chair Baseline Vocal Quality: Hoarse;Breathy;Wet Volitional Cough: Weak;Wet Volitional Swallow: Unable to elicit    Oral/Motor/Sensory Function Overall Oral Motor/Sensory Function: Within functional limits  Ice Chips Ice chips: Within functional limits Presentation: Self Fed   Thin Liquid Thin Liquid: Impaired Presentation: Spoon Pharyngeal  Phase Impairments: Throat Clearing - Delayed;Cough - Delayed    Nectar Thick Nectar Thick Liquid: Impaired Presentation: Spoon Pharyngeal Phase Impairments:  Throat Clearing - Immediate;Cough - Delayed   Honey Thick Honey Thick Liquid: Not tested   Puree Puree: Impaired Presentation: Spoon Pharyngeal Phase Impairments: Throat Clearing - Immediate   Solid     Solid: Not tested      Lucila Maine 03/01/2019,6:09 PM

## 2019-03-01 NOTE — Progress Notes (Signed)
    Subjective  - POD #1, s/p right CEA and take back for bleeding   Feels ok Recently extubated   Physical Exam:  Hoarse voice Neuro intact        Assessment/Plan:  POD #1  Will order ST for swallow eval given hoarseness Keep JP in 1 more day given output May be able to [facility] floor later today if she remains stable   Natalie [name] 03/01/2019 1:21 PM --  Vitals:   03/01/19 1100 03/01/19 1200  BP: (!) 156/77 (!) 161/58  Pulse: 97 99  Resp: 19 (!) 23  Temp:  98.7 F (37.1 C)  SpO2: 98% 98%    Intake/Output Summary (Last 24 hours) at 03/01/2019 1321 Last data filed at 03/01/2019 1203 Gross per 24 hour  Intake 1774.09 ml  Output 1770 ml  Net 4.09 ml     Laboratory CBC    Component Value Date/Time   WBC 15.0 (H) 03/01/2019 0532   HGB 9.5 (L) 03/01/2019 0532   HCT 27.0 (L) 03/01/2019 0532   PLT 158 03/01/2019 0532    BMET    Component Value Date/Time   NA 133 (L) 03/01/2019 0532   K 4.7 03/01/2019 0532   CL 102 03/01/2019 0532   CO2 22 03/01/2019 0532   GLUCOSE 281 (H) 03/01/2019 0532   BUN 17 03/01/2019 0532   CREATININE 1.04 (H) 03/01/2019 0532   CALCIUM 8.5 (L) 03/01/2019 0532   GFRNONAA 48 (L) 03/01/2019 0532   GFRAA 56 (L) 03/01/2019 0532    COAG Lab Results  Component Value Date   INR 1.0 02/25/2019   No results found for: PTT  Antibiotics Anti-infectives (From admission, onward)   Start     Dose/Rate Route Frequency Ordered Stop   02/28/19 1600  ceFAZolin (ANCEF) IVPB 2g/100 mL premix     2 g 200 mL/hr over 30 Minutes Intravenous Every 8 hours 02/28/19 1509 03/01/19 0005   02/28/19 0710  ceFAZolin (ANCEF) 2-4 GM/100ML-% IVPB    Note to Pharmacy: Milinda Cave   : cabinet override      02/28/19 0710 02/28/19 0815   02/28/19 0630  ceFAZolin (ANCEF) IVPB 2g/100 mL premix     2 g 200 mL/hr over 30 Minutes Intravenous On call to O.R. 02/28/19 [id number] 02/28/19 0816       V. Leia Alf, M.D., [facility] [place] Office: [phone] Pager:  [phone]

## 2019-03-01 NOTE — Progress Notes (Signed)
Pt. Extubated and placed on 2 l . No apparent distress noted at this time.

## 2019-03-01 NOTE — Anesthesia Postprocedure Evaluation (Signed)
Anesthesia Post Note  Patient: Natalie [name]  Procedure(s) Performed: ENDARTERECTOMY CAROTID (Left )  Patient location during evaluation: ICU Anesthesia Type: General Level of consciousness: awake and alert and oriented Pain management: pain level controlled Vital Signs Assessment: post-procedure vital signs reviewed and stable Respiratory status: spontaneous breathing, nonlabored ventilation, respiratory function stable and patient connected to nasal cannula oxygen Cardiovascular status: blood pressure returned to baseline and stable Postop Assessment: no apparent nausea or vomiting Anesthetic complications: no     Last Vitals:  Vitals:   03/01/19 1400 03/01/19 1500  BP: 92/67 (!) 151/63  Pulse: (!) 102 94  Resp: (!) 34 (!) 23  Temp:    SpO2: 96% 94%    Last Pain:  Vitals:   03/01/19 1435  TempSrc:   PainSc: Asleep                 Martha Clan

## 2019-03-01 NOTE — Progress Notes (Signed)
Follow up - Critical Care Medicine Note  Patient Details:    Natalie [name] is an 85 y.o. female former smoker presented for carotid endarterectomy in the setting of symptomatic critical LEFT carotid occlusion.  The patient underwent carotid endarterectomy without incident.  She was in the PACU when it was noted that her neck started to swell and she developed tracheal deviation.  She required reintubation and to go back to the operating room for evacuation of a left neck hematoma.  Admitted to the intensive care unit intubated and mechanically ventilated.  Lines, Airways, Drains: Airway 7 mm (Active)  Secured at (cm) 21 cm 03/01/19 0812  Measured From Lips 03/01/19 0812  Secured Location Right 03/01/19 0812  Secured By [employer] 03/01/19 0812  Tube Holder Repositioned Yes 03/01/19 0400  Cuff Pressure (cm H2O) 17 cm H2O 03/01/19 0812  Site Condition Dry 03/01/19 0812     Closed System Drain Left;Anterior Neck Bulb (JP) 7 Fr. (Active)  Site Description Unable to view 03/01/19 0400  Dressing Status Clean;Dry;Intact 03/01/19 0400  Drainage Appearance Bloody;Bright red 03/01/19 0400  Status To suction (Charged) 03/01/19 0400  Output (mL) 30 mL 03/01/19 0802     Urethral Catheter Beryle Quant, RN Latex;Straight-tip 16 Fr. (Active)  Indication for Insertion or Continuance of Catheter Unstable critically ill patients first 24-48 hours (See Criteria) 03/01/19 0400  Site Assessment Clean;Intact;Dry 03/01/19 0400  Catheter Maintenance Bag below level of bladder;Catheter secured;Drainage bag/tubing not touching floor;Insertion date on drainage bag;Seal intact 03/01/19 0400  Collection Container Standard drainage bag 03/01/19 0400  Securement Method Leg strap 03/01/19 0400  Urinary Catheter Interventions (if applicable) Unclamped 123XX123 0400  Output (mL) 375 mL 03/01/19 0802    Anti-infectives:  Anti-infectives (From admission, onward)   Start     Dose/Rate Route Frequency  Ordered Stop   02/28/19 1600  ceFAZolin (ANCEF) IVPB 2g/100 mL premix     2 g 200 mL/hr over 30 Minutes Intravenous Every 8 hours 02/28/19 1509 03/01/19 0005   02/28/19 0710  ceFAZolin (ANCEF) 2-4 GM/100ML-% IVPB    Note to Pharmacy: Milinda Cave   : cabinet override      02/28/19 0710 02/28/19 0815   02/28/19 0630  ceFAZolin (ANCEF) IVPB 2g/100 mL premix     2 g 200 mL/hr over 30 Minutes Intravenous On call to O.R. 02/28/19 0622 02/28/19 0816      Microbiology: Results for orders placed or performed during the hospital encounter of 02/28/19  MRSA PCR Screening     Status: Abnormal   Collection Time: 02/28/19  3:17 PM   Specimen: Nasal Mucosa; Nasopharyngeal  Result Value Ref Range Status   MRSA by PCR POSITIVE (A) NEGATIVE Final    Comment:        The GeneXpert MRSA Assay (FDA approved for NASAL specimens only), is one component of a comprehensive MRSA colonization surveillance program. It is not intended to diagnose MRSA infection nor to guide or monitor treatment for MRSA infections. RESULT CALLED TO, READ BACK BY AND VERIFIED WITH: Lucky Rathke 02/28/2019 1643 KMP Performed at [facility], [address] [place], [place] [address]     Best Practice/Protocols:  VTE Prophylaxis: Mechanical GI Prophylaxis: Proton Pump Inhibitor   Events: 12/18: Carotid endarterectomy, left.  Developed neck hematoma and upper airway obstruction secondary to the same required reexploration, JP drain to neck.  Intubated. 12/19: Good air leak, follows commands on SAT, edema of neck markedly improved, begin weaning.  Studies: DG Chest [facility]  1 View  Result Date: 02/28/2019 CLINICAL DATA:  Intubation. EXAM: PORTABLE CHEST 1 VIEW COMPARISON:  CT 10/31/2018. FINDINGS: Endotracheal tube noted with its tip 4 cm above the carina. A catheter is noted over the left neck, this could represent a left IJ line, clinical correlation suggested. Cardiomegaly. No pulmonary venous  congestion. COPD. Mild right base infiltrate cannot be excluded. No pleural effusion or pneumothorax. Left costophrenic angle incompletely imaged. IMPRESSION: 1.  Endotracheal tube noted with its tip 4 cm above the carina. 2.  COPD.  Mild right base infiltrate cannot be excluded. Electronically Signed   By: Marcello Moores  Register   On: 02/28/2019 16:11    Consults: Treatment Team:  Pccm, Ander Gaster, MD   Subjective:    Overnight Issues: No overnight issues, sedated mechanically ventilated.  Objective:  Vital signs for last 24 hours: Temp:  [97.1 F (36.2 C)-98.6 F (37 C)] 98.6 F (37 C) (12/19 0802) Pulse Rate:  [60-100] 70 (12/19 0802) Resp:  [0-28] 16 (12/19 0802) BP: (82-162)/(50-92) 151/57 (12/19 0800) SpO2:  [87 %-100 %] 96 % (12/19 0837) Arterial Line BP: (128)/(105) 128/105 (12/18 1127) FiO2 (%):  [28 %-40 %] 28 % (12/19 0837) Weight:  [66.4 kg] 66.4 kg (12/18 1515)  Hemodynamic parameters for last 24 hours:    Intake/Output from previous day: 12/18 0701 - 12/19 0700 In: 3175.4 [I.V.:2685.4; Blood:300; IV Piggyback:190] Out: 1390 [Urine:1275; Drains:65; Blood:50]  Intake/Output this shift: Total I/O In: 298.7 [I.V.:298.7] Out: 405 [Urine:375; Drains:30]  Vent settings for last 24 hours: Vent Mode: PSV FiO2 (%):  [28 %-40 %] 28 % Set Rate:  [16 bmp] 16 bmp Vt Set:  [450 mL] 450 mL PEEP:  [5 cmH20] 5 cmH20 Pressure Support:  [5 cmH20] 5 cmH20  Physical Exam:  Constitutional: well-developed, well-nourished.  Intubated, tolerating SBT HENT:  Normocephalic and atraumatic.  Orotracheally intubated, moist mucous membranes.  Eyes: Pupils are equal, round, and reactive to light. Conjunctivae are normal. No scleral icterus.  Neck: No tracheal deviation.  Edema of neck has significantly decreased.  JP drain in place.  Left endarterectomy incision.  Air leak present with ETT pilot balloon deflated.   Cardiovascular: Normal rate, regular rhythm and normal heart sounds.  No  murmur heard. Respiratory: Effort normal. She has no wheezes. She has no rales.  Coarse breath sounds, synchronous with the ventilator . GI: Soft. Bowel sounds are normal. She exhibits no distension.  No grimacing on palpation  Musculoskeletal: No deformity or edema.   Neurological:  Off sedation, follows commands, able to lift head off the pillow without difficulty. Skin: Skin is warm and dry.  Psychiatric:  No agitation.  Assessment/Plan:   Acute respiratory failure due to upper airway obstruction Upper airway obstruction due to post endarterectomy hematoma She has undergone drainage Edema markedly decreased, JP drain in place Decadron for edema x2 more doses Has air leak present today Plan for extubation AVOID ACE INHIBITORS  COPD without exacerbation DuoNebs as needed Pulmonary toilet postextubation (IS/Acapella)  Critical carotid stenosis Peripheral vascular disease Status post endarterectomy Postop care per Dr. Delana Meyer  Diastolic Dysfunction No evidence of decompensation Monitor  Best practice: DVT prophylaxis: SCDs, avoid anticoagulants due to post surgical hematoma GI prophylaxis: Protonix Antibiotic prophylaxis: Ancef per surgical protocol Postsurgical hyperglycemia protocol     LOS: 1 day   Additional comments: None  Critical Care Total Time*: 35 Minutes  C. Derrill Kay, MD [place] P[facility]M 03/01/2019  *Care during the described time interval was provided by me and/or other providers  on the critical care team.  I have reviewed this patient's available data, including medical history, events of note, physical examination and test results as part of my evaluation.  **This note was dictated using voice recognition software/Dragon.  Despite best efforts to proofread, errors can occur which can change the meaning.  Any change was purely unintentional.

## 2019-03-01 NOTE — Anesthesia Postprocedure Evaluation (Signed)
Anesthesia Post Note  Patient: Natalie [name]  Procedure(s) Performed: EVACUATION OF HEMATOMA (Left )  Patient location during evaluation: ICU Anesthesia Type: General Level of consciousness: awake and alert and oriented Pain management: pain level controlled Vital Signs Assessment: post-procedure vital signs reviewed and stable Respiratory status: spontaneous breathing, nonlabored ventilation, respiratory function stable and patient connected to nasal cannula oxygen Cardiovascular status: blood pressure returned to baseline and stable Postop Assessment: no apparent nausea or vomiting Anesthetic complications: no     Last Vitals:  Vitals:   03/01/19 1400 03/01/19 1500  BP: 92/67 (!) 151/63  Pulse: (!) 102 94  Resp: (!) 34 (!) 23  Temp:    SpO2: 96% 94%    Last Pain:  Vitals:   03/01/19 1435  TempSrc:   PainSc: Asleep                 Martha Clan

## 2019-03-02 ENCOUNTER — Encounter: Payer: Self-pay | Admitting: Vascular Surgery

## 2019-03-02 LAB — BASIC METABOLIC PANEL
Anion gap: 6 (ref 5–15)
BUN: 16 mg/dL (ref 8–23)
CO2: 26 mmol/L (ref 22–32)
Calcium: 8.9 mg/dL (ref 8.9–10.3)
Chloride: 105 mmol/L (ref 98–111)
Creatinine, Ser: 0.87 mg/dL (ref 0.44–1.00)
GFR calc Af Amer: >60 mL/min (ref >60)
GFR calc non Af Amer: 60 mL/min — ABNORMAL LOW (ref >60)
Glucose, Bld: 124 mg/dL — ABNORMAL HIGH (ref 70–99)
Potassium: 3.8 mmol/L (ref 3.5–5.1)
Sodium: 137 mmol/L (ref 135–145)

## 2019-03-02 LAB — CBC WITH DIFFERENTIAL/PLATELET
Abs Immature Granulocytes: 0.19 10*3/uL — ABNORMAL HIGH (ref 0.00–0.07)
Basophils Absolute: 0 10*3/uL (ref 0.0–0.1)
Basophils Relative: 0 %
Eosinophils Absolute: 0 10*3/uL (ref 0.0–0.5)
Eosinophils Relative: 0 %
HCT: 27.7 % — ABNORMAL LOW (ref 36.0–46.0)
Hemoglobin: 9.7 g/dL — ABNORMAL LOW (ref 12.0–15.0)
Immature Granulocytes: 1 %
Lymphocytes Relative: 7 %
Lymphs Abs: 1.5 10*3/uL (ref 0.7–4.0)
MCH: 31.2 pg (ref 26.0–34.0)
MCHC: 35 g/dL (ref 30.0–36.0)
MCV: 89.1 fL (ref 80.0–100.0)
Monocytes Absolute: 2.2 10*3/uL — ABNORMAL HIGH (ref 0.1–1.0)
Monocytes Relative: 10 %
Neutro Abs: 17.8 10*3/uL — ABNORMAL HIGH (ref 1.7–7.7)
Neutrophils Relative %: 82 %
Platelets: 179 10*3/uL (ref 150–400)
RBC: 3.11 MIL/uL — ABNORMAL LOW (ref 3.87–5.11)
RDW: 13.2 % (ref 11.5–15.5)
WBC: 21.6 10*3/uL — ABNORMAL HIGH (ref 4.0–10.5)
nRBC: 0 % (ref 0.0–0.2)

## 2019-03-02 LAB — GLUCOSE, CAPILLARY
Glucose-Capillary: 126 mg/dL — ABNORMAL HIGH (ref 70–99)
Glucose-Capillary: 99 mg/dL (ref 70–99)
Glucose-Capillary: 148 mg/dL — ABNORMAL HIGH (ref 70–99)
Glucose-Capillary: 129 mg/dL — ABNORMAL HIGH (ref 70–99)

## 2019-03-02 MED ORDER — SODIUM CHLORIDE 0.9 % IV SOLN: INTRAVENOUS | Status: DC

## 2019-03-02 NOTE — Progress Notes (Signed)
    Subjective  - POD #2  No complaints other than some neck pain   Physical Exam:  Left neck ecchymosis which is soft Neuro intact  JP removed       Assessment/Plan:  POD #2  OK to transfer to floor Repeat swallow study Monday Given NPO status, will gently hydrate with NS at 50/hr Good uop  [name] 03/02/2019 6:14 PM --  Vitals:   03/02/19 1000 03/02/19 1200  BP: (!) 147/74 134/61  Pulse: 70 73  Resp: (!) 24 (!) 21  Temp:  98.7 F (37.1 C)  SpO2: 93% 94%    Intake/Output Summary (Last 24 hours) at 03/02/2019 1814 Last data filed at 03/02/2019 1701 Gross per 24 hour  Intake --  Output 3500 ml  Net -3500 ml     Laboratory CBC    Component Value Date/Time   WBC 21.6 (H) 03/02/2019 0620   HGB 9.7 (L) 03/02/2019 0620   HCT 27.7 (L) 03/02/2019 0620   PLT 179 03/02/2019 0620    BMET    Component Value Date/Time   NA 137 03/02/2019 0620   K 3.8 03/02/2019 0620   CL 105 03/02/2019 0620   CO2 26 03/02/2019 0620   GLUCOSE 124 (H) 03/02/2019 0620   BUN 16 03/02/2019 0620   CREATININE 0.87 03/02/2019 0620   CALCIUM 8.9 03/02/2019 0620   GFRNONAA 60 (L) 03/02/2019 0620   GFRAA >60 03/02/2019 0620    COAG Lab Results  Component Value Date   INR 1.0 02/25/2019   No results found for: PTT  Antibiotics Anti-infectives (From admission, onward)   Start     Dose/Rate Route Frequency Ordered Stop   02/28/19 1600  ceFAZolin (ANCEF) IVPB 2g/100 mL premix     2 g 200 mL/hr over 30 Minutes Intravenous Every 8 hours 02/28/19 1509 03/01/19 0005   02/28/19 0710  ceFAZolin (ANCEF) 2-4 GM/100ML-% IVPB    Note to Pharmacy: Milinda Cave   : cabinet override      02/28/19 0710 02/28/19 0815   02/28/19 0630  ceFAZolin (ANCEF) IVPB 2g/100 mL premix     2 g 200 mL/hr over 30 Minutes Intravenous On call to O.R. 02/28/19 [id number] 02/28/19 0816       V. Leia Alf, M.D., [facility] [place] Office:  [phone] Pager:  [phone]

## 2019-03-02 NOTE — Progress Notes (Signed)
Foley catheter removed per orders at 1820. Maricar RN on floor given thorough report. Patient transported to room 112 with black bag, hearing aids and patient belongings bag. Patient transported via bed with SWAT RN.

## 2019-03-02 NOTE — Progress Notes (Addendum)
Follow up - Critical Care Medicine Note  Patient Details:    Natalie [name] is an 85 y.o. female former smoker presented for carotid endarterectomy in the setting of symptomatic critical LEFT carotid occlusion.  The patient underwent carotid endarterectomy without incident.  She was in the PACU when it was noted that her neck started to swell and she developed tracheal deviation.  She required reintubation and to go back to the operating room for evacuation of a left neck hematoma.  Admitted to the intensive care unit intubated and mechanically ventilated.  Lines, Airways, Drains: Airway 7 mm (Active)  Secured at (cm) 21 cm 03/01/19 0812  Measured From Lips 03/01/19 0812  Secured Location Right 03/01/19 0812  Secured By [employer] 03/01/19 0812  Tube Holder Repositioned Yes 03/01/19 0400  Cuff Pressure (cm H2O) 17 cm H2O 03/01/19 0812  Site Condition Dry 03/01/19 0812     Closed System Drain Left;Anterior Neck Bulb (JP) 7 Fr. (Active)  Site Description Unable to view 03/01/19 0400  Dressing Status Clean;Dry;Intact 03/01/19 0400  Drainage Appearance Bloody;Bright red 03/01/19 0400  Status To suction (Charged) 03/01/19 0400  Output (mL) 30 mL 03/01/19 0802     Urethral Catheter Beryle Quant, RN Latex;Straight-tip 16 Fr. (Active)  Indication for Insertion or Continuance of Catheter Unstable critically ill patients first 24-48 hours (See Criteria) 03/01/19 0400  Site Assessment Clean;Intact;Dry 03/01/19 0400  Catheter Maintenance Bag below level of bladder;Catheter secured;Drainage bag/tubing not touching floor;Insertion date on drainage bag;Seal intact 03/01/19 0400  Collection Container Standard drainage bag 03/01/19 0400  Securement Method Leg strap 03/01/19 0400  Urinary Catheter Interventions (if applicable) Unclamped 123XX123 0400  Output (mL) 375 mL 03/01/19 0802    Anti-infectives:  Anti-infectives (From admission, onward)   Start     Dose/Rate Route Frequency  Ordered Stop   02/28/19 1600  ceFAZolin (ANCEF) IVPB 2g/100 mL premix     2 g 200 mL/hr over 30 Minutes Intravenous Every 8 hours 02/28/19 1509 03/01/19 0005   02/28/19 0710  ceFAZolin (ANCEF) 2-4 GM/100ML-% IVPB    Note to Pharmacy: Milinda Cave   : cabinet override      02/28/19 0710 02/28/19 0815   02/28/19 0630  ceFAZolin (ANCEF) IVPB 2g/100 mL premix     2 g 200 mL/hr over 30 Minutes Intravenous On call to O.R. 02/28/19 0622 02/28/19 0816      Microbiology: Results for orders placed or performed during the hospital encounter of 02/28/19  MRSA PCR Screening     Status: Abnormal   Collection Time: 02/28/19  3:17 PM   Specimen: Nasal Mucosa; Nasopharyngeal  Result Value Ref Range Status   MRSA by PCR POSITIVE (A) NEGATIVE Final    Comment:        The GeneXpert MRSA Assay (FDA approved for NASAL specimens only), is one component of a comprehensive MRSA colonization surveillance program. It is not intended to diagnose MRSA infection nor to guide or monitor treatment for MRSA infections. RESULT CALLED TO, READ BACK BY AND VERIFIED WITH: Lucky Rathke 02/28/2019 1643 KMP Performed at [facility], [address] [place], [place] [address]     Best Practice/Protocols:  VTE Prophylaxis: Mechanical GI Prophylaxis: Proton Pump Inhibitor   Events: 12/18: Carotid endarterectomy, left.  Developed neck hematoma and upper airway obstruction secondary to the same required reexploration, JP drain to neck.  Intubated. 12/19: Good air leak, follows commands on SAT, edema of neck markedly improved, begin weaning. 12/20: Neck edema continues to  improve.  Phonating well.  Swallowing well.  Studies: DG Chest Port 1 View  Result Date: 02/28/2019 CLINICAL DATA:  Intubation. EXAM: PORTABLE CHEST 1 VIEW COMPARISON:  CT 10/31/2018. FINDINGS: Endotracheal tube noted with its tip 4 cm above the carina. A catheter is noted over the left neck, this could represent a left IJ line,  clinical correlation suggested. Cardiomegaly. No pulmonary venous congestion. COPD. Mild right base infiltrate cannot be excluded. No pleural effusion or pneumothorax. Left costophrenic angle incompletely imaged. IMPRESSION: 1.  Endotracheal tube noted with its tip 4 cm above the carina. 2.  COPD.  Mild right base infiltrate cannot be excluded. Electronically Signed   By: Marcello Moores  Register   On: 02/28/2019 16:11    Consults: Treatment Team:  Pccm, Ander Gaster, MD   Subjective:    Overnight Issues: No overnight issues, sedated mechanically ventilated.  Objective:  Vital signs for last 24 hours: Temp:  [98.4 F (36.9 C)-99.3 F (37.4 C)] 98.7 F (37.1 C) (12/20 1200) Pulse Rate:  [70-117] 73 (12/20 1200) Resp:  [15-28] 21 (12/20 1200) BP: (134-169)/(58-109) 134/61 (12/20 1200) SpO2:  [90 %-100 %] 94 % (12/20 1200)  Hemodynamic parameters for last 24 hours:    Intake/Output from previous day: 12/19 0701 - 12/20 0700 In: 298.7 [I.V.:298.7] Out: 3535 [Urine:3425; Drains:110]  Intake/Output this shift: Total I/O In: -  Out: 500 [Urine:500]  Vent settings for last 24 hours:    Physical Exam:  Constitutional: well-developed, well-nourished.  Phonating well some mild hoarseness HENT:  Normocephalic and atraumatic.  Eyes: Pupils are equal, round, and reactive to light. Conjunctivae are normal. No scleral icterus.  Neck: No tracheal deviation.  Edema of neck continues to decrease.  JP drain in place.  Left endarterectomy incision.  Some bruising noted Cardiovascular: Normal rate, regular rhythm and normal heart sounds.  No murmur heard. Respiratory: Effort normal. She has no wheezes. She has no rales.   Clear. GI: Soft. Bowel sounds are normal. She exhibits no distension.  Musculoskeletal: No deformity or edema.   Neurological:  No focal deficits, alert and oriented  Skin: Skin is warm and dry.  Psychiatric:  No agitation.  Assessment/Plan:   Acute respiratory failure due to  upper airway obstruction-RESOLVED Upper airway obstruction due to post endarterectomy hematoma She has undergone drainage Edema markedly decreased, JP drain in place Completed Decadron No sequela post extubation AVOID ACE INHIBITORS  COPD without exacerbation DuoNebs as needed Pulmonary toilet (IS/Acapella)  Critical carotid stenosis Peripheral vascular disease Status post endarterectomy Postop care per Dr. Ardyth Harps. al  Diastolic Dysfunction No evidence of decompensation Monitor  Best practice: DVT prophylaxis: SCDs, avoid anticoagulants due to post surgical hematoma GI prophylaxis: Protonix Antibiotic prophylaxis: Ancef per surgical protocol, completed Postsurgical hyperglycemia protocol     LOS: 2 days   Additional comments: From our standpoint can transfer to [place] floor.  PCCM will sign off please reconsult as needed.  Critical Care Total Time*: 35 Minutes  C. Derrill Kay, MD [place] PCCM 03/02/2019  *Care during the described time interval was provided by me and/or other providers on the critical care team.  I have reviewed this patient's available data, including medical history, events of note, physical examination and test results as part of my evaluation.  **This note was dictated using voice recognition software/Dragon.  Despite best efforts to proofread, errors can occur which can change the meaning.  Any change was purely unintentional.

## 2019-03-02 NOTE — Anesthesia Postprocedure Evaluation (Signed)
Anesthesia Post Note  Patient: Guliana Sandmeyer Brys  Procedure(s) Performed: EVACUATION OF HEMATOMA (Left )  Patient location during evaluation: ICU Anesthesia Type: General Level of consciousness: awake and alert Pain management: pain level controlled Vital Signs Assessment: post-procedure vital signs reviewed and stable Respiratory status: spontaneous breathing, nonlabored ventilation and respiratory function stable Cardiovascular status: blood pressure returned to baseline and stable Postop Assessment: no apparent nausea or vomiting Anesthetic complications: no     Last Vitals:  Vitals:   03/02/19 1000 03/02/19 1200  BP: (!) 147/74 134/61  Pulse: 70 73  Resp: (!) 24 (!) 21  Temp:  37.1 C  SpO2: 93% 94%    Last Pain:  Vitals:   03/02/19 1200  TempSrc: Oral  PainSc: 0-No pain                 Tera Mater

## 2019-03-02 NOTE — Progress Notes (Signed)
Pt transfer to 1C prior to shift change.  Pt A&Ox3. VSS. O2 sats 94% on 2L O2 per [place].  Incision to L neck well approximated.

## 2019-03-02 NOTE — Progress Notes (Signed)
MD at bedside and removed JP drain. Patient tolerated well. 2x2 and tegaderm placed.

## 2019-03-02 NOTE — Progress Notes (Signed)
Patient up to chair with minimal assist, tolerating well.

## 2019-03-03 LAB — SURGICAL PATHOLOGY

## 2019-03-03 LAB — BASIC METABOLIC PANEL
Anion gap: 9 (ref 5–15)
BUN: 15 mg/dL (ref 8–23)
CO2: 25 mmol/L (ref 22–32)
Calcium: 9 mg/dL (ref 8.9–10.3)
Chloride: 103 mmol/L (ref 98–111)
Creatinine, Ser: 0.78 mg/dL (ref 0.44–1.00)
GFR calc Af Amer: >60 mL/min (ref >60)
GFR calc non Af Amer: >60 mL/min (ref >60)
Glucose, Bld: 131 mg/dL — ABNORMAL HIGH (ref 70–99)
Potassium: 3.6 mmol/L (ref 3.5–5.1)
Sodium: 137 mmol/L (ref 135–145)

## 2019-03-03 LAB — GLUCOSE, CAPILLARY
Glucose-Capillary: 135 mg/dL — ABNORMAL HIGH (ref 70–99)
Glucose-Capillary: 137 mg/dL — ABNORMAL HIGH (ref 70–99)
Glucose-Capillary: 127 mg/dL — ABNORMAL HIGH (ref 70–99)
Glucose-Capillary: 134 mg/dL — ABNORMAL HIGH (ref 70–99)

## 2019-03-03 LAB — MAGNESIUM: Magnesium: 1.9 mg/dL (ref 1.7–2.4)

## 2019-03-03 MED ORDER — POTASSIUM CHLORIDE 20 MEQ/15ML (10%) PO SOLN
40.0000 meq | Freq: Once | ORAL | Status: AC
  Administered 2019-03-03: 40 meq via ORAL
  Filled 2019-03-03: qty 30

## 2019-03-03 MED ORDER — POTASSIUM CHLORIDE CRYS ER 20 MEQ PO TBCR: 40.0000 meq | EXTENDED_RELEASE_TABLET | Freq: Once | ORAL | Status: DC

## 2019-03-03 MED ORDER — MAGNESIUM OXIDE 400 (241.3 MG) MG PO TABS
400.0000 mg | ORAL_TABLET | Freq: Once | ORAL | Status: AC
  Administered 2019-03-03: 04:00:00 400 mg via ORAL
  Filled 2019-03-03: qty 1

## 2019-03-03 NOTE — TOC Initial Note (Addendum)
Transition of Care [facility]) - Initial/Assessment Note    Patient Details  Name: Natalie [name] MRN: [id number] Date of Birth: Apr 18, 1929  Transition [facility] CM/SW Contact:    Shelbie Hutching, RN Phone Number: 03/03/2019, 12:13 PM  Clinical Narrative:                 Patient is admitted with carotid stenosis and underwent and left carotid endarterectomy.  Patient is from home where she lives with her daughter.  Patient uses a walker at home but is independent in ADL's.  Patient agrees to home health at discharge and does not have a preference in agency.  Home Health referral given to Floydene Flock with [facility] for RN, PT, OT and aide.  Patient's daughter will provide transportation home when medically stable, patient remains on acute O2 and has just passed her swallow eval and started on a diet today.   Expected Discharge Plan: [place] Barriers to Discharge: Continued Medical Work up   Patient Goals and CMS Choice Patient states their goals for this hospitalization and ongoing recovery are:: to get back home CMS Medicare.gov Compare Post Acute Care list provided to:: Patient Choice offered to / list presented to : Patient  Expected Discharge Plan and Services Expected Discharge Plan: [place]   Discharge Planning Services: CM Consult Post Acute Care Choice: [place] arrangements for the past 2 months: Single Family Home(townhome)                 DME Arranged: 3-N-1 DME Agency: [employer] Date DME Agency Contacted: 03/03/19 Time DME Agency Contacted: 1212 Representative spoke with at DME Agency: [place] Arranged: RN, PT, OT, Nurse's Aide [place] Agency: [place] ([place]) Date [place]: 03/03/19 Time [place]: 1212 Representative spoke with at [place]: Floydene Flock  Prior Living Arrangements/Services Living arrangements for the past 2 months: Single Family  Home(townhome) Lives with:: Adult Children Patient language and need for interpreter reviewed:: Yes Do you feel safe going back to the place where you live?: Yes      Need for Family Participation in Patient Care: Yes (Comment) Care giver support system in place?: Yes (comment)(daughter) Current home services: Other (comment)(walker x2) Criminal Activity/Legal Involvement Pertinent to Current Situation/Hospitalization: No - Comment as needed  Activities of Daily Living Home Assistive Devices/Equipment: Eyeglasses, Hearing aid, Blood pressure cuff ADL Screening (condition at time of admission) Patient's cognitive ability adequate to safely complete daily activities?: Yes Is the patient deaf or have difficulty hearing?: Yes(wears hearing aides) Does the patient have difficulty seeing, even when wearing glasses/contacts?: No Does the patient have difficulty concentrating, remembering, or making decisions?: No Patient able to express need for assistance with ADLs?: Yes Does the patient have difficulty dressing or bathing?: No Independently performs ADLs?: Yes (appropriate for developmental age) Does the patient have difficulty walking or climbing stairs?: No Weakness of Legs: None Weakness of Arms/Hands: None  Permission Sought/Granted Permission sought to share information with : Case Manager, Customer service manager, Family Supports Permission granted to share information with : Yes, Verbal Permission Granted     Permission granted to share info w AGENCY: [place] granted to share info w Relationship: Child psychotherapist     Emotional Assessment Appearance:: Appears stated age Attitude/Demeanor/Rapport: Engaged Affect (typically observed): Accepting Orientation: : Oriented to Self, Oriented to Place, Oriented to  Time, Oriented to Situation Alcohol / Substance Use:  Not Applicable Psych Involvement: No (comment)  Admission diagnosis:  Carotid artery  stenosis [I65.29] Patient Active Problem List   Diagnosis Date Noted  . Carotid artery stenosis 02/28/2019  . Carotid stenosis, symptomatic w/o infarct, left 10/17/2018  . Chest wall pain 09/20/2018  . Encounter for medical examination to establish care 02/27/2018  . Hearing loss 02/27/2018  . Screening for osteoporosis 02/27/2018  . Depression, major, single episode, mild ([place]) 02/27/2018  . Essential hypertension 02/27/2018  . Memory changes 02/27/2018  . COPD (chronic obstructive pulmonary disease) ([place]) 02/27/2018  . HLD (hyperlipidemia) 02/27/2018  . History of TIA (transient ischemic attack) 02/27/2018  . Prediabetes 11/17/2015  . Seroma 12/18/2014  . Personal history of malignant neoplasm of breast 08/22/2014  . History of fracture of right hip 05/26/2014  . Peripheral vascular disease ([place]) 05/26/2014  . Depressive disorder, not elsewhere classified 07/03/2013  . TIA (transient ischemic attack) 08/21/2012  . Nail fungus 03/08/2012  . At risk for falls 06/15/2011  . 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  . Cancer of breast ([place]) 01/02/2011  . Carotid atherosclerosis 01/02/2011  . Chronic hoarseness 01/02/2011  . Esophageal reflux 01/02/2011   PCP:  [name], FNP Pharmacy:   [id number] - [place], [place] [address] [place] [address] Phone: [phone] Fax: [phone]     Social Determinants of Health (SDOH) Interventions    Readmission Risk Interventions No flowsheet data found.

## 2019-03-03 NOTE — Progress Notes (Signed)
 Vein and Vascular Surgery  Daily Progress Note   Subjective  - 3 Days Post-Op  Patient in good spirits this evening.  She denies pain.  She has been tolerating her diet  Objective Vitals:   03/02/19 1800 03/02/19 1852 03/03/19 0800 03/03/19 1550  BP: (!) 162/68 (!) 166/66 (!) 173/68 (!) 142/62  Pulse: 83 89 (!) 109 79  Resp: (!) 28 18 18 18   Temp: 98.4 F (36.9 C) 98.2 F (36.8 C) 99.3 F (37.4 C) 99.2 F (37.3 C)  TempSrc: Oral Oral Oral Oral  SpO2: 98% 94% 95% 90%  Weight:      Height:        Intake/Output Summary (Last 24 hours) at 03/03/2019 1746 Last data filed at 03/03/2019 [id number] Gross per 24 hour  Intake 273.29 ml  Output 850 ml  Net -576.71 ml    PULM  Normal effort , no use of accessory muscles CV  No JVD, RRR Abd      No distended, nontender VASC  extensive ecchymoses of the neck and supraclavicular area this extends past the midline.  Incision is clean dry and intact.  Neurologically patient is intact Laboratory CBC    Component Value Date/Time   WBC 21.6 (H) 03/02/2019 0620   HGB 9.7 (L) 03/02/2019 0620   HCT 27.7 (L) 03/02/2019 0620   PLT 179 03/02/2019 0620    BMET    Component Value Date/Time   NA 137 03/03/2019 0224   K 3.6 03/03/2019 0224   CL 103 03/03/2019 0224   CO2 25 03/03/2019 0224   GLUCOSE 131 (H) 03/03/2019 0224   BUN 15 03/03/2019 0224   CREATININE 0.78 03/03/2019 0224   CALCIUM 9.0 03/03/2019 0224   GFRNONAA >60 03/03/2019 0224   GFRAA >60 03/03/2019 0224    Assessment/Planning: POD #3 s/p left carotid endarterectomy with return to operating room secondary to hematoma   Patient has not been out of bed so I will ask physical therapy to see and assess her tomorrow.  Her swallowing study was much improved and her diet has been advanced.  I will replace the 2 x 2 at the drain exit site on a daily basis  My hope is we will be able to discharge her home either tomorrow or Wednesday  [name]  03/03/2019,  5:46 PM

## 2019-03-03 NOTE — Progress Notes (Signed)
  Speech Language Pathology Treatment: Dysphagia  Patient Details Name: Natalie [name] MRN: [id number] DOB: [birth year] Today's Date: 03/03/2019 Time: [id number] SLP Time Calculation (min) (ACUTE ONLY): 38 min  Assessment / Plan / Recommendation Clinical Impression  Reevaluation through treatment.today. Bedside swallow eval Saturday revealed severe dysphagia with risk of aspiration. Reassessment through treatment today in hopes of improvement. Today Pt. tolerated several bites of applesauce, cracker and thin liquids with minimal difficulty. Occasional throat clear with thin liquids. Pt encouraged to slow down and take small sips only. Pt was able to masticate solids with minimal difficulty once dentures were placed. Minimal to no oral residude after the swallow. Will rec and order Dysphagia 2 diet for ease of swallowing. Strict aspiration precautions. ST to follow up with toleration of diet, hold off on further instrumental assessment for now. Pt does report some previous voice hoarseness prior to this hospitalization but still not back to baseline. Rec consider ENT eval if voice does not return to prior baselioe in the next 1-2 weeks.   HPI HPI: Pt admitted for carotid endarterectomy. Later with hematoma requiring reintubation for evacuation. Bedside swallow ecvaluation on 12/19 with rec for NPO secondary to high risk of aspiration.       SLP Plan  Continue with current plan of care       Recommendations  Diet recommendations: Dysphagia 2 (fine chop) Liquids provided via: Cup;Straw Medication Administration: Whole meds with puree Supervision: Patient able to self feed Compensations: Slow rate;Small sips/bites Postural Changes and/or Swallow Maneuvers: Seated upright 90 degrees                SLP Visit Diagnosis: Dysphagia, oropharyngeal phase (R13.12) Plan: Continue with current plan of care       GO                Lucila Maine 03/03/2019, 9:54 AM

## 2019-03-03 NOTE — Plan of Care (Signed)
Pt had a foley in ICU that was removed prior to transfer to 1C.  She attempted to void 2x without success.  We bladder scanned her at 4 am and she was found to have >800 cc of urine.  Texted NP and got order to in/out cath.  We got out approx 850 cc.  During the course of the night, pt became more confused: wanted to get dressed to go home, pulled out her IV, believed I was trying to kill her because I wouldn't give her any water. She is NPO for barium swallow test today.   Christina, RN intervened and she became agreeable to letting me do an in/out cath

## 2019-03-04 ENCOUNTER — Encounter: Payer: Self-pay | Admitting: *Deleted

## 2019-03-04 LAB — GLUCOSE, CAPILLARY
Glucose-Capillary: 129 mg/dL — ABNORMAL HIGH (ref 70–99)
Glucose-Capillary: 176 mg/dL — ABNORMAL HIGH (ref 70–99)

## 2019-03-04 MED ORDER — MUPIROCIN 2 % EX OINT
1.0000 "application " | TOPICAL_OINTMENT | Freq: Two times a day (BID) | CUTANEOUS | Status: DC
  Administered 2019-03-04: 1 via NASAL
  Filled 2019-03-04: qty 22

## 2019-03-04 MED ORDER — CHLORHEXIDINE GLUCONATE CLOTH 2 % EX PADS
6.0000 | MEDICATED_PAD | Freq: Every day | CUTANEOUS | Status: DC
  Administered 2019-03-04: 12:00:00 6 via TOPICAL

## 2019-03-04 NOTE — Discharge Summary (Signed)
[facility]    Discharge Summary    Patient ID:  Natalie [name] MRN: [id number] DOB/AGE: 83-13-1931 85 y.o.  Admit date: 02/28/2019 Discharge date: 03/04/2019 Date of Surgery: 02/28/2019 Surgeon: Surgeon(s): Merit Maybee, Dolores Lory, MD  Admission Diagnosis: Carotid artery stenosis [I65.29]  Discharge Diagnoses:  Carotid artery stenosis [I65.29]  Secondary Diagnoses: Past Medical History:  Diagnosis Date  . Allergies    hay fever  . Arthritis   . Breast cancer, left ([place]) 2015   Left total mastectomy  . COPD (chronic obstructive pulmonary disease) ([place])   . GERD (gastroesophageal reflux disease)   . Hyperlipemia   . Hypertension   . Stroke [facility]) 2002   TIA    Procedure(s): EVACUATION OF HEMATOMA  Discharged Condition: good  HPI:  Patient presented to [facility] for left carotid endarterectomy.  Left carotid endarterectomy was performed without intraoperative incident.  She was hemodynamically stable throughout.  In the recovery room she was noted to have swelling and ecchymosis develop.  This progressed and I elected to return to the OR for evacuation of the hematoma.  She was reintubated and the wound reopened.  No specific bleeding source was found.  The wound was washed out JP drain was placed.  She was left intubated.  Postoperative day #1 she extubated.  She still had significant swelling with a little bit of difficulty swallowing.  Over the next 3 days this improved dramatically she is now tolerating regular diet.  She is off oxygen.  She was evaluated by physical therapy and found to be safe for home with some additional home health.  On postoperative day #4 she is fit for discharge.  She is discharged home.  Hospital Course:  Natalie [name] is a 85 y.o. female is S/P Left Procedure(s): EVACUATION OF HEMATOMA Extubated: POD # 1 Physical exam: There is mild left-sided neck swelling.  There is moderate  ecchymoses of the left neck and supraclavicular area which crosses the midline slightly.  Neurologically the patient is intact Post-op wounds healing well Pt. Ambulating, voiding and taking PO diet without difficulty. Pt pain controlled with PO pain meds. Labs as below Complications: Postoperative hematoma left neck with return to the OR for evacuation  Consults:  Intensive care medicine for ventilator management  Significant Diagnostic Studies: CBC Lab Results  Component Value Date   WBC 21.6 (H) 03/02/2019   HGB 9.7 (L) 03/02/2019   HCT 27.7 (L) 03/02/2019   MCV 89.1 03/02/2019   PLT 179 03/02/2019    BMET    Component Value Date/Time   NA 137 03/03/2019 0224   K 3.6 03/03/2019 0224   CL 103 03/03/2019 0224   CO2 25 03/03/2019 0224   GLUCOSE 131 (H) 03/03/2019 0224   BUN 15 03/03/2019 0224   CREATININE 0.78 03/03/2019 0224   CALCIUM 9.0 03/03/2019 0224   GFRNONAA >60 03/03/2019 0224   GFRAA >60 03/03/2019 0224   COAG Lab Results  Component Value Date   INR 1.0 02/25/2019     Disposition:  Discharge to :Home Discharge Instructions    Call MD for:  redness, tenderness, or signs of infection (pain, swelling, bleeding, redness, odor or green/yellow discharge around incision site)   Complete by: As directed    Call MD for:  severe or increased pain, loss or decreased feeling  in affected limb(s)   Complete by: As directed    Call MD for:  temperature >100.5   Complete by: As directed  Discharge instructions   Complete by: As directed    OK to shower  Change the bandage over the drain site daily and as needed until it stops draining   Driving Restrictions   Complete by: As directed    No driving for 2 weeks   Face-to-face encounter (required for Medicare/Medicaid patients)   Complete by: As directed    I [name] certify that this patient is under my care and that I, or a nurse practitioner or physician's assistant working with me, had a face-to-face  encounter that meets the physician face-to-face encounter requirements with this patient on 03/04/2019. The encounter with the patient was in whole, or in part for the following medical condition(s) which is the primary reason for home health care (List medical condition): She is s/p left carotid endarterectomy with post operative hematoma s/p reintubation on vent for 36 hours   The encounter with the patient was in whole, or in part, for the following medical condition, which is the primary reason for home health care: She is s/p left carotid endarterectomy with post operative hematoma s/p reintubation on vent for 36 hours   I certify that, based on my findings, the following services are medically necessary home health services:  Nursing Physical therapy     Reason for Medically Necessary Home Health Services: Skilled Nursing- Skilled Assessment/Observation   My clinical findings support the need for the above services: Unable to leave home safely without assistance and/or assistive device   Further, I certify that my clinical findings support that this patient is homebound due to: Unsafe ambulation due to balance issues   Face-to-face encounter (required for Medicare/Medicaid patients)   Complete by: As directed    I [name] certify that this patient is under my care and that I, or a nurse practitioner or physician's assistant working with me, had a face-to-face encounter that meets the physician face-to-face encounter requirements with this patient on 03/04/2019. The encounter with the patient was in whole, or in part for the following medical condition(s) which is the primary reason for home health care (List medical condition):  She is s/p left carotid endarterectomy with post operative hematoma s/p reintubation on vent for 36 hours   The encounter with the patient was in whole, or in part, for the following medical condition, which is the primary reason for home health care: She is s/p left  carotid endarterectomy with post operative hematoma s/p reintubation on vent for 36 hours   I certify that, based on my findings, the following services are medically necessary home health services:  Nursing Physical therapy     Reason for Medically Necessary Home Health Services: Skilled Nursing- Skilled Assessment/Observation   My clinical findings support the need for the above services: Unable to leave home safely without assistance and/or assistive device   Further, I certify that my clinical findings support that this patient is homebound due to: Unsafe ambulation due to balance issues   Home Health   Complete by: As directed    To provide the following care/treatments:  PT OT RN     Home Health   Complete by: As directed    To provide the following care/treatments:  PT OT RN     No dressing needed   Complete by: As directed    Replace only if drainage present   Resume previous diet   Complete by: As directed      Allergies as of 03/04/2019      Reactions  Influenza Vaccines Shortness Of Breath      Medication List    STOP taking these medications   clopidogrel 75 MG tablet Commonly known as: PLAVIX     TAKE these medications   amLODipine 10 MG tablet Commonly known as: NORVASC TAKE 1 TABLET BY MOUTH EVERY DAY   aspirin EC 81 MG tablet Take 81 mg by mouth daily. In preparation for surgery 12/18/ After stopping plavix   atorvastatin 20 MG tablet Commonly known as: LIPITOR TAKE 1 TABLET BY MOUTH EVERY DAY   Calcium Citrate-Vitamin D 250-200 MG-UNIT Tabs Take 250 mg by mouth every morning.   cholecalciferol 25 MCG (1000 UT) tablet Commonly known as: VITAMIN D3 Take 1,000 Units by mouth daily.   fluticasone 50 MCG/ACT nasal spray Commonly known as: FLONASE Place 2 sprays into both nostrils daily.   lisinopril 5 MG tablet Commonly known as: ZESTRIL TAKE ONE TABLET (5 MG DOSE) BY MOUTH DAILY. What changed: See the new instructions.   metoprolol  tartrate 50 MG tablet Commonly known as: LOPRESSOR TAKE 1 TABLET BY MOUTH EVERY DAY   Tiotropium Bromide Monohydrate 2.5 MCG/ACT Aers Inhale 2 puffs into the lungs daily.   traZODone 50 MG tablet Commonly known as: DESYREL Take 0.5 tablets (25 mg total) by mouth at bedtime.      Verbal and written Discharge instructions given to the patient. Wound care per Discharge AVS Follow-up Information    Zurii Hewes, Dolores Lory, MD Follow up in 2 week(s).   Specialties: Vascular Surgery, Cardiology, Radiology, Vascular Surgery Why: Double book as needed patient has sutures that need to come out with a left carotid duplex Contact information: [place] [address] [id number]           Signed: [name], MD  03/04/2019, 2:49 PM

## 2019-03-04 NOTE — Evaluation (Signed)
Physical Therapy Evaluation Patient Details Name: Natalie [name] MRN: [id number] DOB: 21-Feb-1930 Today's Date: 03/04/2019   History of Present Illness  85 yo female admitted s/p L carotid endarterectomy with return to OR secondary to hematoma evacuation (12/18). PMH includes COPD, HLD, HTN, TIA, breast cancer  Clinical Impression  Pt 85 yo female admitted for above. Pt very pleasant and agreeable to PT. Pt reports living with her daughter and their small dog in a one level home with level entry. Pt owns a RW and rollator but typically does not ambulate with either. Pt reports being independent with ADLs and is still driving, denies any falls last year. Pt required min guard assist for transfers and ambulation with RW. Pt ambulated with RW with no instances of unsteadiness. Recommend pt continue ambulating with RW for at least the near future for improved safety. Pt received on 2L nasal cannula. Pt O2 sats monitored t/o session with pt at 96% on RA sitting EOB. Pt ambulated on RA with O2 sats 93% midway through ambulation and 92% post ambulation. Pt left in recliner on RA with nursing notified and oxygen in reach. Pt had no complaints of SOB during session and is not normally on oxygen. Pt presents with decreased strength, power, balance and activity tolerance limiting functional mobility. Pt would benefit from acute PT to improve noted deficits. Recommendation for home health PT following discharge to further improve deficits, decrease fall risk and maximize functional mobility.     Follow Up Recommendations Home health PT    Equipment Recommendations  None recommended by PT    Recommendations for Other Services       Precautions / Restrictions Precautions Precautions: Fall Restrictions Weight Bearing Restrictions: No      Mobility  Bed Mobility Overal bed mobility: Modified Independent             General bed mobility comments: mildly increased time and effort, safe getting  EOB  Transfers Overall transfer level: Needs assistance Equipment used: Rolling walker (2 wheeled) Transfers: Sit to/from Stand Sit to Stand: Min guard         General transfer comment: min guard for safety, steady with RW for transfer, cuing for hand placement  Ambulation/Gait Ambulation/Gait assistance: Min guard Gait Distance (Feet): 180 Feet Assistive device: Rolling walker (2 wheeled) Gait Pattern/deviations: Step-through pattern;Decreased stride length;Trunk flexed Gait velocity: decreased   General Gait Details: pt ambulated around nurses station on RA with RW, cuing required to keep walker closer, overall steady with walker, mildly decreased gait speed, O2 sats 93% during ambulation on room air, 92% post ambulation  Stairs            Wheelchair Mobility    Modified Rankin (Stroke Patients Only)       Balance Overall balance assessment: Mild deficits observed, not formally tested;Needs assistance Sitting-balance support: Feet supported Sitting balance-Leahy Scale: Good Sitting balance - Comments: steady sitting EOB   Standing balance support: Bilateral upper extremity supported;During functional activity Standing balance-Leahy Scale: Fair Standing balance comment: B UE support on RW for ambulation                             Pertinent Vitals/Pain Pain Assessment: No/denies pain    Home Living Family/patient expects to be discharged to:: Private residence Living Arrangements: Children;Other (Comment)(daughter, small dog) Available Help at Discharge: Family;Available PRN/intermittently Type of Home: House Home Access: Level entry     Home Layout: One level  Home Equipment: [place] - 4 wheels;Walker - 2 wheels;Grab bars - tub/shower Additional Comments: typically does not use either walker but will take rollator when shes walking the small dog so that when the dog gets tired he can rest in the seat of rollator    Prior Function Level of  Independence: Independent         Comments: pt reports being independent with ADLs, still drives although not far, no falls last year but has fallen before     Hand Dominance        Extremity/Trunk Assessment   Upper Extremity Assessment Upper Extremity Assessment: Generalized weakness    Lower Extremity Assessment Lower Extremity Assessment: Generalized weakness       Communication   Communication: No difficulties;HOH  Cognition Arousal/Alertness: Awake/alert Behavior During Therapy: WFL for tasks assessed/performed Overall Cognitive Status: Within Functional Limits for tasks assessed                                        General Comments      Exercises     Assessment/Plan    PT Assessment Patient needs continued PT services  PT Problem List Decreased strength;Decreased mobility;Decreased activity tolerance;Decreased balance;Cardiopulmonary status limiting activity;Decreased knowledge of use of DME;Decreased range of motion       PT Treatment Interventions DME instruction;Therapeutic exercise;Gait training;Balance training;Stair training;Functional mobility training;Therapeutic activities;Patient/family education;Neuromuscular re-education    PT Goals (Current goals can be found in the Care Plan section)  Acute Rehab PT Goals Patient Stated Goal: go home PT Goal Formulation: With patient Time For Goal Achievement: 03/18/19 Potential to Achieve Goals: Good    Frequency Min 2X/week   Barriers to discharge        Co-evaluation               AM-PAC PT "6 Clicks" Mobility  Outcome Measure Help needed turning from your back to your side while in a flat bed without using bedrails?: A Little Help needed moving from lying on your back to sitting on the side of a flat bed without using bedrails?: A Little Help needed moving to and from a bed to a chair (including a wheelchair)?: A Little Help needed standing up from a chair using your  arms (e.g., wheelchair or bedside chair)?: A Little Help needed to walk in hospital room?: A Little Help needed climbing 3-5 steps with a railing? : A Little 6 Click Score: 18    End of Session Equipment Utilized During Treatment: Gait belt Activity Tolerance: Patient tolerated treatment well Patient left: in chair;with call bell/phone within reach;with chair alarm set Nurse Communication: Mobility status;Other (comment)(O2 sats with ambulation and pt left on RA) PT Visit Diagnosis: Unsteadiness on feet (R26.81);Muscle weakness (generalized) (M62.81)    Time: [id number] PT Time Calculation (min) (ACUTE ONLY): 18 min   Charges:   PT Evaluation $PT Eval Moderate Complexity: 1 Mod          Reeva Davern PT, DPT 11:22 AM,03/04/19 [phone]   Smiley Birr Drucilla Chalet 03/04/2019, 11:16 AM

## 2019-03-04 NOTE — TOC Transition Note (Signed)
Transition of Care [facility]) - CM/SW Discharge Note   Patient Details  Name: Natalie [name] MRN: [id number] Date of Birth: 03-24-1929  [facility] of [facility]) CM/SW Contact:  Shelbie Hutching, RN Phone Number: 03/04/2019, 2:42 PM   Clinical Narrative:    Patient has been weaned off O2 and is tolerating a diet.  Patient is medically stable to discharge home.  Home Health has been arranged with [place] for RN, PT, and OT- Floydene Flock with [facility] notified of discharge today.  Patient decided that she did not want a 3 in 1.  Patient's daughter will provide transportation home.    Final next level of care: [place] Barriers to Discharge: No Barriers Identified   Patient Goals and CMS Choice Patient states their goals for this hospitalization and ongoing recovery are:: to get back home CMS Medicare.gov Compare Post Acute Care list provided to:: Patient Choice offered to / list presented to : Patient  Discharge Placement                       Discharge Plan and Services   Discharge Planning Services: CM Consult Post Acute Care Choice: Home Health          DME Arranged: 3-N-1 DME Agency: [employer] Date DME Agency Contacted: 03/03/19 Time DME Agency Contacted: 1212 Representative spoke with at DME Agency: [place]: RN, PT, OT [facility] Agency: [place] ([place]) Date [place]: 03/04/19 Time [place]: [id number] Representative spoke with at [place]: [place] (SDOH) Interventions     Readmission Risk Interventions No flowsheet data found.

## 2019-03-04 NOTE — Progress Notes (Signed)
  Speech Language Pathology Treatment: Dysphagia  Patient Details Name: Natalie [name] MRN: [id number] DOB: February 11, 1930 Today's Date: 03/04/2019 Time: 1120-1200 SLP Time Calculation (min) (ACUTE ONLY): 40 min  Assessment / Plan / Recommendation Clinical Impression  Pt seen today as f/u w/ toleration of diet; need for further objective assessment vs able to upgrade diet consistency. Pt's Dtr was present in room; pt sitting in chair drinking water via Straw. Pt and Dtr denied any difficulty swallowing the current thin liquids and Minced foods diet. Pt stated she felt her swallowing/throat were "improving" a great deal. Pt's vocal quality was strong but noted a min gravely quality and strain especially when she spoke on residual breath support. Education given to both pt and Dtr on monitoring pt's vocal quality; education on Vocal Hygiene given. Discussed possible need for f/u w/ ENT if still noting mild deficits in ~2 weeks.  Pt stated she felt she was "ready" for upgraded diet consistency(Mech soft foods) - cooked, moist and cut. Recommended continuing to mince the meats to avoid any tougher, bigger pieces that could be too solid and not well-broken down when swallowing. Pt consumed trials of Mech soft foods at Lunch meal w/ no overt s/s of aspiration reported by pt/Dtr; no difficulty masticating/swallowing the more solid consistencies of foods in the diet. Thoroughly discussed and gave education on the diet consistency(minced meats, cooked soft foods -- avoid aaw, uncooked foods); preparation options and need to moisten/add gravies; food options; general aspiration precautions. Recommend a Dysphagia level 3 (Mech soft) diet w/ thin liquids; general aspiration precautions. Pills in Puree for ease of swallowing if needed at home. F/u w/ ENT for any vocal quality concerns in ~2 weeks post discharge. Pt and Dtr agreed.     HPI HPI: Pt is a 85 year old former smoker presented for carotid endarterectomy in  the setting of symptomatic critical LEFT carotid occlusion.  The patient underwent carotid endarterectomy without incident.  She was in the PACU when it was noted that her neck started to swell and she developed tracheal deviation.  She required reintubation and to go back to the operating room for evacuation of a left neck hematoma. Post extubation(~2 days), initial BSE recommended NPO status; pt has since benn followed at bedside w/ initiation of oral diet(modified for ease of swallowing).        SLP Plan  Continue with current plan of care       Recommendations  Diet recommendations: Dysphagia 3 (mechanical soft);Thin liquid(w/ minced meats for ease) Liquids provided via: Cup;Straw Medication Administration: Whole meds with puree(if needed for easier swallowing ) Supervision: Patient able to self feed Compensations: Minimize environmental distractions;Slow rate;Small sips/bites;Multiple dry swallows after each bite/sip;Follow solids with liquid Postural Changes and/or Swallow Maneuvers: Seated upright 90 degrees;Upright 30-60 min after meal                General recommendations: (dietician f/u if needed) Oral Care Recommendations: Oral care BID;Patient independent with oral care Follow up Recommendations: None(TBD) SLP Visit Diagnosis: Dysphagia, oropharyngeal phase (R13.12) Plan: Continue with current plan of care       [place], [place], CCC-SLP Ruhi Kopke 03/04/2019, 2:55 PM

## 2019-03-06 DIAGNOSIS — K219 Gastro-esophageal reflux disease without esophagitis: Secondary | ICD-10-CM | POA: Diagnosis not present

## 2019-03-06 DIAGNOSIS — I97638 Postprocedural hematoma of a circulatory system organ or structure following other circulatory system procedure: Secondary | ICD-10-CM | POA: Diagnosis not present

## 2019-03-06 DIAGNOSIS — J9601 Acute respiratory failure with hypoxia: Secondary | ICD-10-CM | POA: Diagnosis not present

## 2019-03-06 DIAGNOSIS — E785 Hyperlipidemia, unspecified: Secondary | ICD-10-CM | POA: Diagnosis not present

## 2019-03-06 DIAGNOSIS — I10 Essential (primary) hypertension: Secondary | ICD-10-CM | POA: Diagnosis not present

## 2019-03-06 DIAGNOSIS — Z9012 Acquired absence of left breast and nipple: Secondary | ICD-10-CM | POA: Diagnosis not present

## 2019-03-06 DIAGNOSIS — Z9181 History of falling: Secondary | ICD-10-CM | POA: Diagnosis not present

## 2019-03-06 DIAGNOSIS — J449 Chronic obstructive pulmonary disease, unspecified: Secondary | ICD-10-CM | POA: Diagnosis not present

## 2019-03-06 DIAGNOSIS — M1990 Unspecified osteoarthritis, unspecified site: Secondary | ICD-10-CM | POA: Diagnosis not present

## 2019-03-06 DIAGNOSIS — Z7982 Long term (current) use of aspirin: Secondary | ICD-10-CM | POA: Diagnosis not present

## 2019-03-06 DIAGNOSIS — T8189XD Other complications of procedures, not elsewhere classified, subsequent encounter: Secondary | ICD-10-CM | POA: Diagnosis not present

## 2019-03-06 DIAGNOSIS — Z8673 Personal history of transient ischemic attack (TIA), and cerebral infarction without residual deficits: Secondary | ICD-10-CM | POA: Diagnosis not present

## 2019-03-06 DIAGNOSIS — Z853 Personal history of malignant neoplasm of breast: Secondary | ICD-10-CM | POA: Diagnosis not present

## 2019-03-06 DIAGNOSIS — Z87891 Personal history of nicotine dependence: Secondary | ICD-10-CM | POA: Diagnosis not present

## 2019-03-06 DIAGNOSIS — Z96641 Presence of right artificial hip joint: Secondary | ICD-10-CM | POA: Diagnosis not present

## 2019-03-11 ENCOUNTER — Telehealth: Payer: Self-pay | Admitting: Family

## 2019-03-11 NOTE — Telephone Encounter (Signed)
[facility] [phone] Verbal orders needed for skilled nursing  1wk -1 2wk-1 1wk-2  1 every other week for 5wks

## 2019-03-12 NOTE — Telephone Encounter (Signed)
Lm on [facility]'s VM giving verbal okay for skilled nursing. I asked that she call back if any further questions.

## 2019-03-17 ENCOUNTER — Other Ambulatory Visit: Payer: Self-pay

## 2019-03-17 ENCOUNTER — Ambulatory Visit (INDEPENDENT_AMBULATORY_CARE_PROVIDER_SITE_OTHER): Payer: Medicare Other | Admitting: Vascular Surgery

## 2019-03-17 ENCOUNTER — Telehealth (INDEPENDENT_AMBULATORY_CARE_PROVIDER_SITE_OTHER): Payer: Self-pay

## 2019-03-17 ENCOUNTER — Other Ambulatory Visit: Payer: Self-pay | Admitting: Family

## 2019-03-17 ENCOUNTER — Encounter (INDEPENDENT_AMBULATORY_CARE_PROVIDER_SITE_OTHER): Payer: Self-pay | Admitting: Vascular Surgery

## 2019-03-17 VITALS — BP 145/62 | HR 79 | Resp 16 | Wt 141.4 lb

## 2019-03-17 DIAGNOSIS — I6522 Occlusion and stenosis of left carotid artery: Secondary | ICD-10-CM

## 2019-03-17 NOTE — Progress Notes (Signed)
Patient ID: Natalie [name], female   DOB: December 19, 1929, 86 y.o.   MRN: [id number]  Chief Complaint  Patient presents with  . Follow-up    [facility] 2week suture removal    HPI Natalie [name] is a 86 y.o. female.    Patient is status post Left carotid endarterectomy with Primary Closure on 02/28/2019.  Surgery was complicated with a postoperative hematoma and she was returned to the OR for evacuation.  She did well following this hematoma evacuation and was discharged to home in good condition on postoperative day #4.  She returns the office today noting that her black and blue is almost completely gone.  Her neck feels much better the swelling is gone.  She has no other complaints.   Past Medical History:  Diagnosis Date  . Allergies    hay fever  . Arthritis   . Breast cancer, left ([place]) 2015   Left total mastectomy  . COPD (chronic obstructive pulmonary disease) ([place])   . GERD (gastroesophageal reflux disease)   . Hyperlipemia   . Hypertension   . Stroke [facility]) 2002   TIA    Past Surgical History:  Procedure Laterality Date  . BREAST BIOPSY  2014  . BREAST LUMPECTOMY  2014   left side  . ENDARTERECTOMY Left 02/28/2019   Procedure: ENDARTERECTOMY CAROTID;  Surgeon: Katha Cabal, MD;  Location: [facility] ORS;  Service: Vascular;  Laterality: Left;  . EVACUATION OF CERVICAL HEMATOMA Left 02/28/2019   Procedure: EVACUATION OF HEMATOMA;  Surgeon: Katha Cabal, MD;  Location: [facility] ORS;  Service: Vascular;  Laterality: Left;  . EYE SURGERY Bilateral    cataract extractions  . JOINT REPLACEMENT    . MASTECTOMY Left May 9th 2017  . RHINOPLASTY  1956  . TONSILLECTOMY  1939  . TOTAL HIP ARTHROPLASTY Right    head of the femur replaced due to fracture      Allergies  Allergen Reactions  . Influenza Vaccines Shortness Of Breath    Current Outpatient Medications  Medication Sig Dispense Refill  . aspirin EC 81 MG tablet Take 81 mg by mouth daily. In  preparation for surgery 12/18/ After stopping plavix    . Calcium Citrate-Vitamin D 250-200 MG-UNIT TABS Take 250 mg by mouth every morning.    . cholecalciferol (VITAMIN D3) 25 MCG (1000 UT) tablet Take 1,000 Units by mouth daily.     . fluticasone (FLONASE) 50 MCG/ACT nasal spray Place 2 sprays into both nostrils daily.    Marland Kitchen lisinopril (ZESTRIL) 5 MG tablet TAKE ONE TABLET (5 MG DOSE) BY MOUTH DAILY. (Patient taking differently: Take 5 mg by mouth daily. ) 90 tablet 0  . Tiotropium Bromide Monohydrate 2.5 MCG/ACT AERS Inhale 2 puffs into the lungs daily.     . traZODone (DESYREL) 50 MG tablet Take 0.5 tablets (25 mg total) by mouth at bedtime. 90 tablet 1  . amLODipine (NORVASC) 10 MG tablet TAKE 1 TABLET BY MOUTH EVERY DAY 90 tablet 0  . atorvastatin (LIPITOR) 20 MG tablet TAKE 1 TABLET BY MOUTH EVERY DAY (Patient not taking: Reported on 02/18/2019) 90 tablet 0  . metoprolol tartrate (LOPRESSOR) 50 MG tablet TAKE 1 TABLET BY MOUTH EVERY DAY 90 tablet 0   No current facility-administered medications for this visit.        Physical Exam BP (!) 145/62 (BP Location: Right Arm)   Pulse 79   Resp 16   Wt 141 lb 6.4 oz (64.1 kg)  BMI 23.53 kg/m  Gen:  WD/WN, NAD Skin: incision C/D/I, sutures are removed Patient is neurologically intact     Assessment/Plan: 1. Carotid stenosis, symptomatic w/o infarct, left Recommend:  The patient is s/p successful left CEA  Duplex ultrasound preoperatively shows approximately 50% contralateral stenosis.  Continue antiplatelet therapy as prescribed Continue management of CAD, HTN and Hyperlipidemia Healthy heart diet,  encouraged exercise at least 4 times per week  Follow up in 3 months with duplex ultrasound and physical exam based on the patient's carotid surgery and 50% stenosis of the right carotid artery   - Carotid; Future      [name] 03/17/2019, 5:40 PM   This note was created with Dragon medical transcription system.   Any errors from dictation are unintentional.

## 2019-03-21 ENCOUNTER — Other Ambulatory Visit: Payer: Self-pay

## 2019-03-21 ENCOUNTER — Other Ambulatory Visit (INDEPENDENT_AMBULATORY_CARE_PROVIDER_SITE_OTHER): Payer: Medicare Other

## 2019-03-21 ENCOUNTER — Telehealth: Payer: Self-pay | Admitting: Family

## 2019-03-21 DIAGNOSIS — R300 Dysuria: Secondary | ICD-10-CM

## 2019-03-21 LAB — POCT URINALYSIS DIPSTICK
Glucose, UA: NEGATIVE
Ketones, UA: 15
Nitrite, UA: POSITIVE
Protein, UA: POSITIVE — AB
Spec Grav, UA: 1.02 (ref 1.010–1.025)
Urobilinogen, UA: 1 E.U./dL
pH, UA: 5 (ref 5.0–8.0)

## 2019-03-21 NOTE — Telephone Encounter (Signed)
Very very appropriate and good idea Please give customary advice that if she develops confusion,  fever, flank pain, nausea vomiting,  she would need to go to urgent care over the weekend.    Urine cultures can take couple days to result, ensure that she is okay with that

## 2019-03-21 NOTE — Telephone Encounter (Signed)
I spoke with patient & she is going to have home health bring by a urine sample today for her. She aid they told her they could do so & have done for other patient's. She said that she is only having the burning & stinging when urinating. She is scheduled for first available f/u 03/28/19. I asked that if all possible home health could get up specimen asap so we could possibly get something started for her over the weekend. I let her know that we would be in touch with urine culture results before her appointment. I also advised UC if any confusion, fever, flank pain n/v over the weekend. Pt verbalized understanding.

## 2019-03-21 NOTE — Telephone Encounter (Signed)
Pt said she thinks that she has a bladder infection. Pt said there is stinging and burning. Pt states that she has a home health nurse that can come get a specimen but wanted to talk with someone here. Please advise.

## 2019-03-21 NOTE — Telephone Encounter (Signed)
noted 

## 2019-03-21 NOTE — Telephone Encounter (Signed)
No appointments today. Could I call patient to have her leave urine & schedule her next week? I just hate for her to come out in this weather.

## 2019-03-22 LAB — URINALYSIS, MICROSCOPIC ONLY: WBC, UA: >30 /hpf — AB (ref 0–5)

## 2019-03-24 ENCOUNTER — Telehealth: Payer: Self-pay | Admitting: Family

## 2019-03-24 ENCOUNTER — Other Ambulatory Visit: Payer: Self-pay | Admitting: Family

## 2019-03-24 DIAGNOSIS — H903 Sensorineural hearing loss, bilateral: Secondary | ICD-10-CM | POA: Diagnosis not present

## 2019-03-24 DIAGNOSIS — R319 Hematuria, unspecified: Secondary | ICD-10-CM

## 2019-03-24 DIAGNOSIS — H6123 Impacted cerumen, bilateral: Secondary | ICD-10-CM | POA: Diagnosis not present

## 2019-03-24 LAB — URINE CULTURE

## 2019-03-24 NOTE — Telephone Encounter (Signed)
Called and left a message for patient letting her know that a prescription was at her pharmacy for her.

## 2019-03-26 ENCOUNTER — Other Ambulatory Visit: Payer: Self-pay

## 2019-03-26 ENCOUNTER — Telehealth: Payer: Self-pay

## 2019-03-26 MED ORDER — SULFAMETHOXAZOLE-TRIMETHOPRIM 800-160 MG PO TABS: 1.0000 | ORAL_TABLET | Freq: Two times a day (BID) | ORAL | 0 refills | Status: DC

## 2019-03-26 NOTE — Telephone Encounter (Signed)
Yes That is fine Advise probiotics since back to back abx can cause severe diarrheal infections such as c diff

## 2019-03-26 NOTE — Telephone Encounter (Signed)
When I called patient to let her know handicapped placard was ready she expressed conce again of UTI. She had said yesterday that she thoughtt it was getting better but today she that urgency was still there as well as some burning. Can we send in bactrim?

## 2019-03-26 NOTE — Telephone Encounter (Signed)
I called ands let patient know that I had sent in the bactrim for her to pick up. She stated that she was eating yogurt daily as opposed to the probiotic.

## 2019-03-28 ENCOUNTER — Ambulatory Visit (INDEPENDENT_AMBULATORY_CARE_PROVIDER_SITE_OTHER): Payer: Medicare Other | Admitting: Family

## 2019-03-28 ENCOUNTER — Telehealth: Payer: Self-pay | Admitting: Family

## 2019-03-28 ENCOUNTER — Encounter: Payer: Self-pay | Admitting: Family

## 2019-03-28 VITALS — BP 130/70 | Ht 64.0 in | Wt 141.0 lb

## 2019-03-28 DIAGNOSIS — J449 Chronic obstructive pulmonary disease, unspecified: Secondary | ICD-10-CM

## 2019-03-28 DIAGNOSIS — E785 Hyperlipidemia, unspecified: Secondary | ICD-10-CM

## 2019-03-28 DIAGNOSIS — I10 Essential (primary) hypertension: Secondary | ICD-10-CM

## 2019-03-28 DIAGNOSIS — R300 Dysuria: Secondary | ICD-10-CM | POA: Diagnosis not present

## 2019-03-28 MED ORDER — TIOTROPIUM BROMIDE MONOHYDRATE 2.5 MCG/ACT IN AERS: 2.0000 | INHALATION_SPRAY | Freq: Every day | RESPIRATORY_TRACT | 3 refills | Status: DC

## 2019-03-28 NOTE — Assessment & Plan Note (Signed)
Resolved on Bactrim.  Patient understands importance of returning to clinic for repeat UA to ensure hematuria resolves.  This has been scheduled

## 2019-03-28 NOTE — Progress Notes (Signed)
Verbal consent for services obtained from patient prior to services given to TELEPHONE visit:   Location of call:  provider at work patient at home  Names of all persons present for services: Mable Paris, NP Chief complaint:  Follow-up to dysuria  Feels well. NO concerns today.   Dysuria resolved  on Bactrim.2 more doses of bactrim. Prior had been on macrobid. No fever, N, V   Due for repeat UA.   Recent left carotid endartectomy with Dr Ronalee Belts. Stitches look well. No concerns. [facility]  H/o COPD- 'breathing fine' .  No increased shortness of breath.  No cough or wheezing. would like refill of spriva.   CAD- on lipitor  HTN- at home, around 120/70. No CP, dizziness.   History, background, results pertinent:   Left breast mastectomy. No longer desires to have mammogram of right breast.   A/P/next steps: Problem List Items Addressed This Visit      Cardiovascular and Mediastinum   Essential hypertension - Primary    BP Readings from Last 3 Encounters:  03/28/19 130/70  03/17/19 (!) 145/62  03/04/19 (!) 161/63   At goal. Continue regimen.         Respiratory   COPD (chronic obstructive pulmonary disease) (HCC)   Relevant Medications   Tiotropium Bromide Monohydrate 2.5 MCG/ACT AERS     Other   Dysuria    Resolved on Bactrim.  Patient understands importance of returning to clinic for repeat UA to ensure hematuria resolves.  This has been scheduled      HLD (hyperlipidemia)    Doing well on statin therapy.  Overdue for lipid panel, this has been ordered.      Relevant Orders   Lipid panel-future       I spent 15 min  discussing plan of care over the phone.

## 2019-03-28 NOTE — Assessment & Plan Note (Signed)
Doing well on statin therapy.  Overdue for lipid panel, this has been ordered.

## 2019-03-28 NOTE — Assessment & Plan Note (Addendum)
BP Readings from Last 3 Encounters:  03/28/19 130/70  03/17/19 (!) 145/62  03/04/19 (!) 161/63   At goal. Continue regimen.

## 2019-03-28 NOTE — Telephone Encounter (Signed)
Unable to contact to set up follow up.

## 2019-04-05 ENCOUNTER — Other Ambulatory Visit: Payer: Self-pay | Admitting: Family

## 2019-04-22 ENCOUNTER — Other Ambulatory Visit (INDEPENDENT_AMBULATORY_CARE_PROVIDER_SITE_OTHER): Payer: Medicare Other

## 2019-04-22 ENCOUNTER — Other Ambulatory Visit: Payer: Self-pay

## 2019-04-22 DIAGNOSIS — R319 Hematuria, unspecified: Secondary | ICD-10-CM

## 2019-04-22 DIAGNOSIS — E785 Hyperlipidemia, unspecified: Secondary | ICD-10-CM | POA: Diagnosis not present

## 2019-04-22 LAB — URINALYSIS, ROUTINE W REFLEX MICROSCOPIC
Bilirubin Urine: NEGATIVE
Hgb urine dipstick: NEGATIVE
Ketones, ur: NEGATIVE
Nitrite: NEGATIVE
RBC / HPF: NONE SEEN (ref 0–?)
Specific Gravity, Urine: 1.025 (ref 1.000–1.030)
Total Protein, Urine: NEGATIVE
Urine Glucose: NEGATIVE
Urobilinogen, UA: 0.2 (ref 0.0–1.0)
pH: 5.5 (ref 5.0–8.0)

## 2019-04-22 LAB — LIPID PANEL
Cholesterol: 187 mg/dL (ref 0–200)
HDL: 51.9 mg/dL (ref >39.00)
LDL Cholesterol: 112 mg/dL — ABNORMAL HIGH (ref 0–99)
NonHDL: 134.7
Total CHOL/HDL Ratio: 4
Triglycerides: 115 mg/dL (ref 0.0–149.0)
VLDL: 23 mg/dL (ref 0.0–40.0)

## 2019-04-23 ENCOUNTER — Telehealth: Payer: Self-pay

## 2019-04-23 ENCOUNTER — Other Ambulatory Visit: Payer: Self-pay | Admitting: Family

## 2019-04-23 DIAGNOSIS — R8281 Pyuria: Secondary | ICD-10-CM

## 2019-04-23 NOTE — Telephone Encounter (Signed)
I spoke with patient in regards to labs. [name] advised we do not treat unless she is having symptoms of UTI. Patient stated that ws not & had no burning, irritation etc like before. She will let us know if so, so we can get her back in for urine culture.

## 2019-04-24 ENCOUNTER — Other Ambulatory Visit: Payer: Self-pay

## 2019-04-24 MED ORDER — ATORVASTATIN CALCIUM 40 MG PO TABS: 40.0000 mg | ORAL_TABLET | Freq: Every day | ORAL | 3 refills | Status: DC

## 2019-05-13 ENCOUNTER — Other Ambulatory Visit: Payer: Self-pay

## 2019-05-13 ENCOUNTER — Other Ambulatory Visit (INDEPENDENT_AMBULATORY_CARE_PROVIDER_SITE_OTHER): Payer: Medicare Other

## 2019-05-13 DIAGNOSIS — R8281 Pyuria: Secondary | ICD-10-CM | POA: Diagnosis not present

## 2019-05-13 LAB — MICROALBUMIN / CREATININE URINE RATIO
Creatinine,U: 99.6 mg/dL
Microalb Creat Ratio: 1.3 mg/g (ref 0.0–30.0)
Microalb, Ur: 1.3 mg/dL (ref 0.0–1.9)

## 2019-05-13 LAB — URINALYSIS, ROUTINE W REFLEX MICROSCOPIC
Bilirubin Urine: NEGATIVE
Hgb urine dipstick: NEGATIVE
Ketones, ur: NEGATIVE
Nitrite: NEGATIVE
RBC / HPF: NONE SEEN (ref 0–?)
Specific Gravity, Urine: 1.02 (ref 1.000–1.030)
Total Protein, Urine: NEGATIVE
Urine Glucose: NEGATIVE
Urobilinogen, UA: 0.2 (ref 0.0–1.0)
pH: 5.5 (ref 5.0–8.0)

## 2019-05-13 NOTE — Addendum Note (Signed)
Addended by: Elpidio Galea T on: 05/13/2019 09:44 AM   Modules accepted: Orders

## 2019-06-06 ENCOUNTER — Other Ambulatory Visit: Payer: Self-pay

## 2019-06-06 ENCOUNTER — Other Ambulatory Visit (INDEPENDENT_AMBULATORY_CARE_PROVIDER_SITE_OTHER): Payer: Medicare Other

## 2019-06-06 DIAGNOSIS — R8281 Pyuria: Secondary | ICD-10-CM | POA: Diagnosis not present

## 2019-06-06 LAB — COMPREHENSIVE METABOLIC PANEL
ALT: 15 U/L (ref 0–35)
AST: 18 U/L (ref 0–37)
Albumin: 4.3 g/dL (ref 3.5–5.2)
Alkaline Phosphatase: 55 U/L (ref 39–117)
BUN: 20 mg/dL (ref 6–23)
CO2: 28 mEq/L (ref 19–32)
Calcium: 9.6 mg/dL (ref 8.4–10.5)
Chloride: 101 mEq/L (ref 96–112)
Creatinine, Ser: 1.2 mg/dL (ref 0.40–1.20)
GFR: 42.41 mL/min — ABNORMAL LOW (ref >60.00)
Glucose, Bld: 123 mg/dL — ABNORMAL HIGH (ref 70–99)
Potassium: 4.4 mEq/L (ref 3.5–5.1)
Sodium: 136 mEq/L (ref 135–145)
Total Bilirubin: 0.5 mg/dL (ref 0.2–1.2)
Total Protein: 7.1 g/dL (ref 6.0–8.3)

## 2019-06-09 ENCOUNTER — Other Ambulatory Visit: Payer: Self-pay | Admitting: Family

## 2019-07-01 ENCOUNTER — Other Ambulatory Visit: Payer: Self-pay | Admitting: Family

## 2019-07-17 ENCOUNTER — Encounter (INDEPENDENT_AMBULATORY_CARE_PROVIDER_SITE_OTHER): Payer: Self-pay | Admitting: Vascular Surgery

## 2019-07-17 ENCOUNTER — Other Ambulatory Visit: Payer: Self-pay

## 2019-07-17 ENCOUNTER — Ambulatory Visit (INDEPENDENT_AMBULATORY_CARE_PROVIDER_SITE_OTHER): Payer: Medicare Other

## 2019-07-17 ENCOUNTER — Ambulatory Visit (INDEPENDENT_AMBULATORY_CARE_PROVIDER_SITE_OTHER): Payer: Medicare Other | Admitting: Vascular Surgery

## 2019-07-17 VITALS — BP 164/63 | HR 63 | Ht 65.0 in | Wt 138.0 lb

## 2019-07-17 DIAGNOSIS — I739 Peripheral vascular disease, unspecified: Secondary | ICD-10-CM | POA: Diagnosis not present

## 2019-07-17 DIAGNOSIS — J449 Chronic obstructive pulmonary disease, unspecified: Secondary | ICD-10-CM

## 2019-07-17 DIAGNOSIS — I6522 Occlusion and stenosis of left carotid artery: Secondary | ICD-10-CM

## 2019-07-17 DIAGNOSIS — I10 Essential (primary) hypertension: Secondary | ICD-10-CM | POA: Diagnosis not present

## 2019-07-17 DIAGNOSIS — S91001A Unspecified open wound, right ankle, initial encounter: Secondary | ICD-10-CM

## 2019-07-17 NOTE — Progress Notes (Signed)
MRN : [id number]  Natalie [name] is a 86 y.o. (1930-03-04) female who presents with chief complaint of  Chief Complaint  Patient presents with  . Follow-up    U/S Follow up  .  History of Present Illness:   The patient is seen for follow up evaluation of carotid stenosis status post  Leftcarotid endarterectomy with Primary Closure on 02/28/2019.  Surgery was complicated with a postoperative hematoma and she was returned to the OR for evacuation.  There were no other post operative problems or complications related to the surgery after the hematoma evacuation.  The patient denies neck or incisional pain.  The patient denies interval amaurosis fugax. There is no recent history of TIA symptoms or focal motor deficits. There is no prior documented CVA.  The patient denies headache.  She has a right ankle wound which is clean and appears to be healing.  She denies claudication or rest pain symptoms.  The patient is taking enteric-coated aspirin 81 mg daily.  The patient has a history of coronary artery disease, no recent episodes of angina or shortness of breath. There is a history of hyperlipidemia which is being treated with a statin.   Carotid Duplex ultrasound done today shows RICA < [id number] and LICA widely patent s/p CEA  Current Meds  Medication Sig  . amLODipine (NORVASC) 10 MG tablet TAKE 1 TABLET BY MOUTH EVERY DAY  . aspirin EC 81 MG tablet Take 81 mg by mouth daily. In preparation for surgery 12/18/ After stopping plavix  . atorvastatin (LIPITOR) 40 MG tablet Take 1 tablet (40 mg total) by mouth daily.  . Calcium Citrate-Vitamin D 250-200 MG-UNIT TABS Take 250 mg by mouth every morning.  . cholecalciferol (VITAMIN D3) 25 MCG (1000 UT) tablet Take 1,000 Units by mouth daily.   . fluticasone (FLONASE) 50 MCG/ACT nasal spray Place 2 sprays into both nostrils daily.  Marland Kitchen lisinopril (ZESTRIL) 5 MG tablet TAKE ONE TABLET (5 MG DOSE) BY MOUTH DAILY.  . metoprolol tartrate  (LOPRESSOR) 50 MG tablet TAKE 1 TABLET BY MOUTH EVERY DAY  . sulfamethoxazole-trimethoprim (BACTRIM DS) 800-160 MG tablet Take 1 tablet by mouth 2 (two) times daily.  . Tiotropium Bromide Monohydrate 2.5 MCG/ACT AERS Inhale 2 puffs into the lungs daily.  . traZODone (DESYREL) 50 MG tablet Take 0.5 tablets (25 mg total) by mouth at bedtime.    Past Medical History:  Diagnosis Date  . Allergies    hay fever  . Arthritis   . Breast cancer, left ([place]) 2015   Left total mastectomy  . COPD (chronic obstructive pulmonary disease) ([place])   . GERD (gastroesophageal reflux disease)   . Hyperlipemia   . Hypertension   . Stroke [facility]) 2002   TIA    Past Surgical History:  Procedure Laterality Date  . BREAST BIOPSY  2014  . BREAST LUMPECTOMY  2014   left side  . ENDARTERECTOMY Left 02/28/2019   Procedure: ENDARTERECTOMY CAROTID;  Surgeon: Katha Cabal, MD;  Location: [facility] ORS;  Service: Vascular;  Laterality: Left;  . EVACUATION OF CERVICAL HEMATOMA Left 02/28/2019   Procedure: EVACUATION OF HEMATOMA;  Surgeon: Katha Cabal, MD;  Location: [facility] ORS;  Service: Vascular;  Laterality: Left;  . EYE SURGERY Bilateral    cataract extractions  . JOINT REPLACEMENT    . MASTECTOMY Left May 9th 2017  . RHINOPLASTY  1956  . TONSILLECTOMY  1939  . TOTAL HIP ARTHROPLASTY Right    head of the femur  replaced due to fracture    Social History Social History   Tobacco Use  . Smoking status: Former Research scientist (life sciences)  . Smokeless tobacco: Never Used  . Tobacco comment: 20 years ago  Substance Use Topics  . Alcohol use: Yes    Comment: occassionally  . Drug use: Never    Family History Family History  Problem Relation Age of Onset  . Breast cancer Mother   . Lung cancer Father     Allergies  Allergen Reactions  . Influenza Vaccines Shortness Of Breath     REVIEW OF SYSTEMS (Negative unless checked)  Constitutional: [] Weight loss  [] Fever  [] Chills Cardiac: [] Chest pain   [] Chest  pressure   [] Palpitations   [] Shortness of breath when laying flat   [] Shortness of breath with exertion. Vascular:  [] Pain in legs with walking   [] Pain in legs at rest  [] History of DVT   [] Phlebitis   [] Swelling in legs   [] Varicose veins   [] Non-healing ulcers Pulmonary:   [] Uses home oxygen   [] Productive cough   [] Hemoptysis   [] Wheeze  [] COPD   [] Asthma Neurologic:  [] Dizziness   [] Seizures   [] History of stroke   [] History of TIA  [] Aphasia   [] Vissual changes   [] Weakness or numbness in arm   [] Weakness or numbness in leg Musculoskeletal:   [] Joint swelling   [] Joint pain   [] Low back pain Hematologic:  [] Easy bruising  [] Easy bleeding   [] Hypercoagulable state   [] Anemic Gastrointestinal:  [] Diarrhea   [] Vomiting  [] Gastroesophageal reflux/heartburn   [] Difficulty swallowing. Genitourinary:  [] Chronic kidney disease   [] Difficult urination  [] Frequent urination   [] Blood in urine Skin:  [x] Rashes   [x] Ulcers  Psychological:  [] History of anxiety   []  History of major depression.  Physical Examination  Vitals:   07/17/19 0956  BP: (!) 164/63  Pulse: 63  Weight: 138 lb (62.6 kg)  Height: 5\' 5"  (1.651 m)   Body mass index is 22.96 kg/m. Gen: WD/WN, NAD Head: [place]/AT, No temporalis wasting.  Ear/Nose/Throat: Hearing grossly intact, nares w/o erythema or drainage Eyes: PER, EOMI, sclera nonicteric.  Neck: Supple, no large masses.   Pulmonary:  Good air movement, no audible wheezing bilaterally, no use of accessory muscles.  Cardiac: RRR, no JVD Vascular: scattered varicosities present bilaterally.  Moderate venous stasis changes to the legs bilaterally.  2+ soft pitting edema. Ulcer right shin noninfected. Vessel Right Left  Radial Palpable Palpable  PT Trace Palpable Not Palpable  DP 2+ Palpable Trace Palpable  Gastrointestinal: Non-distended. No guarding/no peritoneal signs.  Musculoskeletal: M/S 5/5 throughout.  No deformity or atrophy.  Neurologic: CN 2-12 intact.  Symmetrical.  Speech is fluent. Motor exam as listed above. Psychiatric: Judgment intact, Mood & affect appropriate for pt's clinical situation. Dermatologic: Venous rashes   Right shin ulcer noted.  No changes consistent with cellulitis. Lymph : No lichenification or skin changes of chronic lymphedema.  CBC Lab Results  Component Value Date   WBC 21.6 (H) 03/02/2019   HGB 9.7 (L) 03/02/2019   HCT 27.7 (L) 03/02/2019   MCV 89.1 03/02/2019   PLT 179 03/02/2019    BMET    Component Value Date/Time   NA 136 06/06/2019 1024   K 4.4 06/06/2019 1024   CL 101 06/06/2019 1024   CO2 28 06/06/2019 1024   GLUCOSE 123 (H) 06/06/2019 1024   BUN 20 06/06/2019 1024   CREATININE 1.20 06/06/2019 1024   CALCIUM 9.6 06/06/2019 1024   GFRNONAA >60  03/03/2019 0224   GFRAA >60 03/03/2019 0224   CrCl cannot be calculated (Patient's most recent lab result is older than the maximum 21 days allowed.).  COAG Lab Results  Component Value Date   INR 1.0 02/25/2019    Radiology No results found.   Assessment/Plan 1. Stenosis of left carotid artery Recommend:  The patient is s/p successful left CEA  Duplex ultrasound preoperatively shows <30% contralateral stenosis.  Continue antiplatelet therapy as prescribed Continue management of CAD, HTN and Hyperlipidemia Healthy heart diet,  encouraged exercise at least 4 times per week  Follow up in 12 months with duplex ultrasound and physical exam based on the patient's carotid surgery and <50% stenosis of the  right carotid artery   - VAS US CAROTID; Future  2. Peripheral vascular disease ([place])  Recommend:  The patient has evidence of atherosclerosis of the lower extremities with claudication.  The patient does not voice lifestyle limiting changes at this point in time.  The ulcer present on the right shin appears to be healing.  She has a palpable pulse in the dorsalis pedis on the right.  Therefore I do not feel further noninvasive testing  is indicated at this time  No invasive studies, angiography or surgery at this time The patient should continue walking and begin a more formal exercise program.  The patient should continue antiplatelet therapy and aggressive treatment of the lipid abnormalities  No changes in the patient's medications at this time  The patient should continue wearing graduated compression socks 10-15 mmHg strength to control the mild edema.    3. Wound of right ankle, initial encounter Continue to dress the wound with antibiotic ointment such as Neosporin on a daily basis.  4. Essential hypertension Continue antihypertensive medications as already ordered, these medications have been reviewed and there are no changes at this time.   5. Chronic obstructive pulmonary disease, unspecified COPD type ([place]) Continue pulmonary medications and aerosols as already ordered, these medications have been reviewed and there are no changes at this time.      [name], MD  07/17/2019 1:06 PM

## 2019-07-24 DIAGNOSIS — L821 Other seborrheic keratosis: Secondary | ICD-10-CM | POA: Diagnosis not present

## 2019-07-24 DIAGNOSIS — L98499 Non-pressure chronic ulcer of skin of other sites with unspecified severity: Secondary | ICD-10-CM | POA: Diagnosis not present

## 2019-07-24 DIAGNOSIS — L820 Inflamed seborrheic keratosis: Secondary | ICD-10-CM | POA: Diagnosis not present

## 2019-08-13 ENCOUNTER — Encounter: Payer: Self-pay | Admitting: Family

## 2019-08-13 ENCOUNTER — Ambulatory Visit (INDEPENDENT_AMBULATORY_CARE_PROVIDER_SITE_OTHER): Payer: Medicare Other | Admitting: Family

## 2019-08-13 ENCOUNTER — Other Ambulatory Visit: Payer: Self-pay

## 2019-08-13 DIAGNOSIS — I10 Essential (primary) hypertension: Secondary | ICD-10-CM | POA: Diagnosis not present

## 2019-08-13 NOTE — Progress Notes (Signed)
Subjective:    Patient ID: Natalie [name], female    DOB: 1929/12/25, 86 y.o.   MRN: [id number]  CC: Natalie [name] is a 86 y.o. female who presents today for follow up.   HPI: Feels well  No complaints today  HTN- checks blood pressure at home, around 130/70.Takes amlodipine and lisinopril at night, takes metoprolol.  HLD- compliant with increased dose of lipitor, 40mg .   Started to wear compression stockings since seeing Dr Ronalee Belts couple of weeks ago, improvement of BLE.   Denies exertional chest pain or pressure, numbness or tingling radiating to left arm or jaw, palpitations, dizziness, frequent headaches, changes in vision, or shortness of breath.     Completed COVID vaccines  Dr Ronalee Belts - followup endarterectomy, ulcer right shin.  On 81mg  ASA. 5/6//21  No longer screening for breast cancer.  HISTORY:  Past Medical History:  Diagnosis Date  . Allergies    hay fever  . Arthritis   . Breast cancer, left ([place]) 2015   Left total mastectomy  . COPD (chronic obstructive pulmonary disease) ([place])   . GERD (gastroesophageal reflux disease)   . Hyperlipemia   . Hypertension   . Stroke [facility]) 2002   TIA   Past Surgical History:  Procedure Laterality Date  . BREAST BIOPSY  2014  . BREAST LUMPECTOMY  2014   left side  . ENDARTERECTOMY Left 02/28/2019   Procedure: ENDARTERECTOMY CAROTID;  Surgeon: Katha Cabal, MD;  Location: [facility] ORS;  Service: Vascular;  Laterality: Left;  . EVACUATION OF CERVICAL HEMATOMA Left 02/28/2019   Procedure: EVACUATION OF HEMATOMA;  Surgeon: Katha Cabal, MD;  Location: [facility] ORS;  Service: Vascular;  Laterality: Left;  . EYE SURGERY Bilateral    cataract extractions  . JOINT REPLACEMENT    . MASTECTOMY Left May 9th 2017  . RHINOPLASTY  1956  . TONSILLECTOMY  1939  . TOTAL HIP ARTHROPLASTY Right    head of the femur replaced due to fracture   Family History  Problem Relation Age of Onset  . Breast cancer Mother   . Lung  cancer Father     Allergies: Influenza vaccines Current Outpatient Medications on File Prior to Visit  Medication Sig Dispense Refill  . amLODipine (NORVASC) 10 MG tablet TAKE 1 TABLET BY MOUTH EVERY DAY 90 tablet 0  . aspirin EC 81 MG tablet Take 81 mg by mouth daily. In preparation for surgery 12/18/ After stopping plavix    . atorvastatin (LIPITOR) 40 MG tablet Take 1 tablet (40 mg total) by mouth daily. 90 tablet 3  . Calcium Citrate-Vitamin D 250-200 MG-UNIT TABS Take 250 mg by mouth every morning.    . cholecalciferol (VITAMIN D3) 25 MCG (1000 UT) tablet Take 1,000 Units by mouth daily.     . fluticasone (FLONASE) 50 MCG/ACT nasal spray Place 2 sprays into both nostrils daily.    Marland Kitchen lisinopril (ZESTRIL) 5 MG tablet TAKE ONE TABLET (5 MG DOSE) BY MOUTH DAILY. 90 tablet 0  . Tiotropium Bromide Monohydrate 2.5 MCG/ACT AERS Inhale 2 puffs into the lungs daily. 4 g 3  . traZODone (DESYREL) 50 MG tablet Take 0.5 tablets (25 mg total) by mouth at bedtime. 90 tablet 1   No current facility-administered medications on file prior to visit.    Social History   Tobacco Use  . Smoking status: Former Research scientist (life sciences)  . Smokeless tobacco: Never Used  . Tobacco comment: 20 years ago  Substance Use Topics  . Alcohol use:  Yes    Comment: occassionally  . Drug use: Never    Review of Systems  Constitutional: Negative for chills and fever.  HENT: Positive for hearing loss (chronic).   Eyes: Negative for visual disturbance.  Respiratory: Negative for cough.   Cardiovascular: Negative for chest pain and palpitations.  Gastrointestinal: Negative for nausea and vomiting.  Neurological: Negative for headaches.      Objective:    BP 140/60 Comment: right arm  Pulse 78   Temp (!) 97.4 F (36.3 C) (Temporal)   Ht 5\' 5"  (1.651 m)   Wt 137 lb 3.2 oz (62.2 kg)   SpO2 97%   BMI 22.83 kg/m  BP Readings from Last 3 Encounters:  08/13/19 140/60  07/17/19 (!) 164/63  03/28/19 130/70   Wt Readings  from Last 3 Encounters:  08/13/19 137 lb 3.2 oz (62.2 kg)  07/17/19 138 lb (62.6 kg)  03/28/19 141 lb (64 kg)    Physical Exam Vitals reviewed.  Constitutional:      Appearance: She is well-developed.  Eyes:     Conjunctiva/sclera: Conjunctivae normal.  Cardiovascular:     Rate and Rhythm: Normal rate and regular rhythm.     Pulses: Normal pulses.     Heart sounds: Normal heart sounds.  Pulmonary:     Effort: Pulmonary effort is normal.     Breath sounds: Normal breath sounds. No wheezing, rhonchi or rales.  Musculoskeletal:     Right lower leg: No edema.     Left lower leg: No edema.  Skin:    General: Skin is warm and dry.  Neurological:     Mental Status: She is alert.  Psychiatric:        Speech: Speech normal.        Behavior: Behavior normal.        Thought Content: Thought content normal.        Assessment & Plan:   Problem List Items Addressed This Visit      Cardiovascular and Mediastinum   Essential hypertension    A bit labile. Improved after resting in the room. At home appears better controlled. She is taking metoprolol tartrate QD, which after consulting with pharmacy, this  May be inadequate and short acting. As her DBP is on low end, I felt more comfortable asking patient to take Lopressor 25 mg twice daily with close monitoring of blood pressure heart rate prior to any further escalations.  Close follow-up.       Relevant Medications   metoprolol tartrate (LOPRESSOR) 50 MG tablet       I have discontinued Natalie [name]'s sulfamethoxazole-trimethoprim. I have also changed her metoprolol tartrate. Additionally, I am having her maintain her Calcium Citrate-Vitamin D, cholecalciferol, traZODone, fluticasone, aspirin EC, Tiotropium Bromide Monohydrate, atorvastatin, amLODipine, and lisinopril.   Meds ordered this encounter  Medications  . metoprolol tartrate (LOPRESSOR) 50 MG tablet    Sig: Take 0.5 tablets (25 mg total) by mouth 2 (two) times  daily.    Dispense:  90 tablet    Refill:  0    Order Specific Question:   Supervising Provider    Answer:   Crecencio Mc [[id number]]    Return precautions given.   Risks, benefits, and alternatives of the medications and treatment plan prescribed today were discussed, and patient expressed understanding.   Education regarding symptom management and diagnosis given to patient on AVS.  Continue to follow with [name], FNP for routine health maintenance.   Natalie [name]  Natalie [name] and I agreed with plan.   Mable Paris, FNP

## 2019-08-13 NOTE — Assessment & Plan Note (Addendum)
A bit labile. Improved after resting in the room. At home appears better controlled. She is taking metoprolol tartrate QD, which after consulting with pharmacy, this  May be inadequate and short acting. As her DBP is on low end, I felt more comfortable asking patient to take Lopressor 25 mg twice daily with close monitoring of blood pressure heart rate prior to any further escalations.  Close follow-up.

## 2019-08-13 NOTE — Patient Instructions (Addendum)
I am going to double check on your metoprolol tartrate dosing.  Please note, I need the heartrate when you include the blood pressure.   It is imperative that you are seen AT least twice per year for labs and monitoring. Monitor blood pressure at home and me 5-6 reading on separate days. Goal is less than 120/80, based on newest guidelines, however we certainly want to be less than 130/80;  if persistently higher, please make sooner follow up appointment so we can recheck you blood pressure and manage/ adjust medications.   Nice to see you!    Managing Your Hypertension Hypertension is commonly called high blood pressure. This is when the force of your blood pressing against the walls of your arteries is too strong. Arteries are blood vessels that carry blood from your heart throughout your body. Hypertension forces the heart to work harder to pump blood, and may cause the arteries to become narrow or stiff. Having untreated or uncontrolled hypertension can cause heart attack, stroke, kidney disease, and other problems. What are blood pressure readings? A blood pressure reading consists of a higher number over a lower number. Ideally, your blood pressure should be below 120/80. The first ("top") number is called the systolic pressure. It is a measure of the pressure in your arteries as your heart beats. The second ("bottom") number is called the diastolic pressure. It is a measure of the pressure in your arteries as the heart relaxes. What does my blood pressure reading mean? Blood pressure is classified into four stages. Based on your blood pressure reading, your health care provider may use the following stages to determine what type of treatment you need, if any. Systolic pressure and diastolic pressure are measured in a unit called mm Hg. Normal  Systolic pressure: below 123456.  Diastolic pressure: below 80. Elevated  Systolic pressure: Q000111Q.  Diastolic pressure: below  80. Hypertension stage 1  Systolic pressure: 0000000.  Diastolic pressure: [id number]. Hypertension stage 2  Systolic pressure: [id number] or above.  Diastolic pressure: 90 or above. What health risks are associated with hypertension? Managing your hypertension is an important responsibility. Uncontrolled hypertension can lead to:  A heart attack.  A stroke.  A weakened blood vessel (aneurysm).  Heart failure.  Kidney damage.  Eye damage.  Metabolic syndrome.  Memory and concentration problems. What changes can I make to manage my hypertension? Hypertension can be managed by making lifestyle changes and possibly by taking medicines. Your health care provider will help you make a plan to bring your blood pressure within a normal range. Eating and drinking   Eat a diet that is high in fiber and potassium, and low in salt (sodium), added sugar, and fat. An example eating plan is called the DASH (Dietary Approaches to Stop Hypertension) diet. To eat this way: ? Eat plenty of fresh fruits and vegetables. Try to fill half of your plate at each meal with fruits and vegetables. ? Eat whole grains, such as whole wheat pasta, brown rice, or whole grain bread. Fill about one quarter of your plate with whole grains. ? Eat low-fat diary products. ? Avoid fatty cuts of meat, processed or cured meats, and poultry with skin. Fill about one quarter of your plate with lean proteins such as fish, chicken without skin, beans, eggs, and tofu. ? Avoid premade and processed foods. These tend to be higher in sodium, added sugar, and fat.  Reduce your daily sodium intake. Most people with hypertension should eat less than 1,500  mg of sodium a day.  Limit alcohol intake to no more than 1 drink a day for nonpregnant women and 2 drinks a day for men. One drink equals 12 oz of beer, 5 oz of wine, or 1 oz of hard liquor. Lifestyle  Work with your health care provider to maintain a healthy body weight, or to  lose weight. Ask what an ideal weight is for you.  Get at least 30 minutes of exercise that causes your heart to beat faster (aerobic exercise) most days of the week. Activities may include walking, swimming, or biking.  Include exercise to strengthen your muscles (resistance exercise), such as weight lifting, as part of your weekly exercise routine. Try to do these types of exercises for 30 minutes at least 3 days a week.  Do not use any products that contain nicotine or tobacco, such as cigarettes and e-cigarettes. If you need help quitting, ask your health care provider.  Control any long-term (chronic) conditions you have, such as high cholesterol or diabetes. Monitoring  Monitor your blood pressure at home as told by your health care provider. Your personal target blood pressure may vary depending on your medical conditions, your age, and other factors.  Have your blood pressure checked regularly, as often as told by your health care provider. Working with your health care provider  Review all the medicines you take with your health care provider because there may be side effects or interactions.  Talk with your health care provider about your diet, exercise habits, and other lifestyle factors that may be contributing to hypertension.  Visit your health care provider regularly. Your health care provider can help you create and adjust your plan for managing hypertension. Will I need medicine to control my blood pressure? Your health care provider may prescribe medicine if lifestyle changes are not enough to get your blood pressure under control, and if:  Your systolic blood pressure is 130 or higher.  Your diastolic blood pressure is 80 or higher. Take medicines only as told by your health care provider. Follow the directions carefully. Blood pressure medicines must be taken as prescribed. The medicine does not work as well when you skip doses. Skipping doses also puts you at risk for  problems. Contact a health care provider if:  You think you are having a reaction to medicines you have taken.  You have repeated (recurrent) headaches.  You feel dizzy.  You have swelling in your ankles.  You have trouble with your vision. Get help right away if:  You develop a severe headache or confusion.  You have unusual weakness or numbness, or you feel faint.  You have severe pain in your chest or abdomen.  You vomit repeatedly.  You have trouble breathing. Summary  Hypertension is when the force of blood pumping through your arteries is too strong. If this condition is not controlled, it may put you at risk for serious complications.  Your personal target blood pressure may vary depending on your medical conditions, your age, and other factors. For most people, a normal blood pressure is less than 120/80.  Hypertension is managed by lifestyle changes, medicines, or both. Lifestyle changes include weight loss, eating a healthy, low-sodium diet, exercising more, and limiting alcohol. This information is not intended to replace advice given to you by your health care provider. Make sure you discuss any questions you have with your health care provider. Document Revised: 06/21/2018 Document Reviewed: 01/26/2016 Elsevier Patient Education  [place].

## 2019-08-15 ENCOUNTER — Telehealth: Payer: Self-pay | Admitting: Family

## 2019-08-15 MED ORDER — METOPROLOL TARTRATE 50 MG PO TABS: 25.0000 mg | ORAL_TABLET | Freq: Two times a day (BID) | ORAL | 0 refills | Status: DC

## 2019-08-15 NOTE — Telephone Encounter (Signed)
Call pt I consulted with pharm and her lopressor should be a twice daily medication I would be more comfortable actually moving from 50mg  dose to 25mg  and have her take it BID.  Her SBP is on low end and I want be careful with that Advise her to make this change for one week while monitoring her BP, HR and call us with several readings after a full week on new therapy

## 2019-08-15 NOTE — Telephone Encounter (Signed)
LMTCB

## 2019-08-18 NOTE — Telephone Encounter (Signed)
Left a voicemail to call back

## 2019-08-18 NOTE — Telephone Encounter (Signed)
Please circle back and ensure pt is aware of message below

## 2019-08-19 NOTE — Telephone Encounter (Signed)
I called and spoke with the patient and informed her of the message to take 25 mg twice a day and to monitor her BP and HR and call in her readings after 1 week of doing this, patient understood.  Shivam Mestas,cma

## 2019-08-19 NOTE — Telephone Encounter (Signed)
Pt returned your call.  

## 2019-08-22 ENCOUNTER — Other Ambulatory Visit: Payer: Self-pay | Admitting: Family

## 2019-08-22 DIAGNOSIS — F320 Major depressive disorder, single episode, mild: Secondary | ICD-10-CM

## 2019-08-22 DIAGNOSIS — Z0000 Encounter for general adult medical examination without abnormal findings: Secondary | ICD-10-CM

## 2019-09-14 ENCOUNTER — Other Ambulatory Visit: Payer: Self-pay | Admitting: Family

## 2019-09-30 ENCOUNTER — Other Ambulatory Visit: Payer: Self-pay | Admitting: Family

## 2019-10-21 ENCOUNTER — Telehealth: Payer: Self-pay | Admitting: *Deleted

## 2019-10-21 ENCOUNTER — Other Ambulatory Visit: Payer: Self-pay

## 2019-10-21 ENCOUNTER — Ambulatory Visit: Payer: Medicare Other | Admitting: Family

## 2019-10-21 ENCOUNTER — Encounter: Payer: Self-pay | Admitting: Family

## 2019-10-21 ENCOUNTER — Telehealth: Payer: Self-pay | Admitting: Family

## 2019-10-21 VITALS — BP 138/60 | HR 73 | Temp 97.5°F | Resp 17 | Ht 65.0 in | Wt 138.4 lb

## 2019-10-21 DIAGNOSIS — I10 Essential (primary) hypertension: Secondary | ICD-10-CM

## 2019-10-21 DIAGNOSIS — N6310 Unspecified lump in the right breast, unspecified quadrant: Secondary | ICD-10-CM | POA: Diagnosis not present

## 2019-10-21 NOTE — Assessment & Plan Note (Addendum)
History of left breast cancer.  Pending diagnostic images of right breast.  After patient completes records request form with [facility], we will schedule . Detailed instructions given on AVS

## 2019-10-21 NOTE — Telephone Encounter (Signed)
lft vm for pt to call ofc about breast US

## 2019-10-21 NOTE — Telephone Encounter (Signed)
Call [facility] and sch stat right breast imaging ( diagnostic) as ordered Please let pt know date prior to leaving

## 2019-10-21 NOTE — Patient Instructions (Addendum)
Your blood pressure is much better and im very pleased with home readings.   Please call me if persistently greater than 130/80.    We want to schedule a diagnostic right breast mammogram and also right breast ultrasound however we cannot until you go to [facility] breast center and complete paperwork for prior records ( old mammogram). Please go there today and complete the paperwork and call to let us know once this is done.    [place]  [place], [place]  Stay safe!

## 2019-10-21 NOTE — Telephone Encounter (Signed)
Spoke with [name] to let her know that I spoke with [facility] and they stated that pt would have to come by the office to sign a record release form so they could get her last mammogram results. Pt was made aware as well.

## 2019-10-21 NOTE — Progress Notes (Signed)
Subjective:    Patient ID: Natalie [name], female    DOB: 1929/04/23, 86 y.o.   MRN: [id number]  CC: Natalie [name] is a 86 y.o. female who presents today for follow up.   HPI: Feels well today, no complaints. When asked about screening mammogram, patient mentions that she has had a right breast mass for approximately 5 years.  Has a history of left breast cancer, total left mastectomy.  The mass is not tender however she does feel it is enlarged.    HTN- taking lopressor 25mg  BID, amlodipine 10mg , lisinpril 5mg   Has been taking blood pressure at home since started lopressor 25mg  BID 154/61, HR 56 137/62, 64 130/62, 66 120/57, 63 129/58, 62 135/58, 63 127/61, 61  Denies exertional chest pain or pressure, numbness or tingling radiating to left arm or jaw, palpitations, dizziness, frequent headaches, changes in vision, or shortness of breath.      HISTORY:  Past Medical History:  Diagnosis Date  . Allergies    hay fever  . Arthritis   . Breast cancer, left ([place]) 2015   Left total mastectomy  . COPD (chronic obstructive pulmonary disease) ([place])   . GERD (gastroesophageal reflux disease)   . Hyperlipemia   . Hypertension   . Stroke [facility]) 2002   TIA   Past Surgical History:  Procedure Laterality Date  . BREAST BIOPSY  2014  . BREAST LUMPECTOMY  2014   left side  . ENDARTERECTOMY Left 02/28/2019   Procedure: ENDARTERECTOMY CAROTID;  Surgeon: Natalie [name];  Location: [facility] ORS;  Service: Vascular;  Laterality: Left;  . EVACUATION OF CERVICAL HEMATOMA Left 02/28/2019   Procedure: EVACUATION OF HEMATOMA;  Surgeon: Natalie [name];  Location: [facility] ORS;  Service: Vascular;  Laterality: Left;  . EYE SURGERY Bilateral    cataract extractions  . JOINT REPLACEMENT    . MASTECTOMY Left May 9th 2017  . RHINOPLASTY  1956  . TONSILLECTOMY  1939  . TOTAL HIP ARTHROPLASTY Right    head of the femur replaced due to fracture   Family History  Problem Relation  Age of Onset  . Breast cancer Mother   . Lung cancer Father     Allergies: Influenza vaccines Current Outpatient Medications on File Prior to Visit  Medication Sig Dispense Refill  . amLODipine (NORVASC) 10 MG tablet TAKE 1 TABLET BY MOUTH EVERY DAY 90 tablet 0  . aspirin EC 81 MG tablet Take 81 mg by mouth daily. In preparation for surgery 12/18/ After stopping plavix    . atorvastatin (LIPITOR) 40 MG tablet Take 1 tablet (40 mg total) by mouth daily. 90 tablet 3  . Calcium Citrate-Vitamin D 250-200 MG-UNIT TABS Take 250 mg by mouth every morning.    . cholecalciferol (VITAMIN D3) 25 MCG (1000 UT) tablet Take 1,000 Units by mouth daily.     . fluticasone (FLONASE) 50 MCG/ACT nasal spray Place 2 sprays into both nostrils daily.    Marland Kitchen lisinopril (ZESTRIL) 5 MG tablet TAKE ONE TABLET (5 MG DOSE) BY MOUTH DAILY. 90 tablet 0  . metoprolol tartrate (LOPRESSOR) 50 MG tablet Take 0.5 tablets (25 mg total) by mouth 2 (two) times daily. 90 tablet 0  . Tiotropium Bromide Monohydrate 2.5 MCG/ACT AERS Inhale 2 puffs into the lungs daily. 4 g 3  . traZODone (DESYREL) 50 MG tablet TAKE 0.5 TABLETS (25 MG TOTAL) BY MOUTH AT BEDTIME. 45 tablet 3   No current facility-administered medications on file prior to  visit.    Social History   Tobacco Use  . Smoking status: Former Research scientist (life sciences)  . Smokeless tobacco: Never Used  . Tobacco comment: 20 years ago  Vaping Use  . Vaping Use: Never used  Substance Use Topics  . Alcohol use: Yes    Comment: occassionally  . Drug use: Never    Review of Systems  Constitutional: Negative for chills and fever.  Respiratory: Negative for cough.   Cardiovascular: Negative for chest pain and palpitations.  Gastrointestinal: Negative for nausea and vomiting.  Neurological: Negative for dizziness and headaches.      Objective:    BP 138/60   Pulse 73   Temp (!) 97.5 F (36.4 C) (Oral)   Resp 17   Ht 5\' 5"  (1.651 m)   Wt 138 lb 6.4 oz (62.8 kg)   SpO2 95%   BMI  23.03 kg/m  BP Readings from Last 3 Encounters:  10/21/19 138/60  08/13/19 140/60  07/17/19 (!) 164/63   Wt Readings from Last 3 Encounters:  10/21/19 138 lb 6.4 oz (62.8 kg)  08/13/19 137 lb 3.2 oz (62.2 kg)  07/17/19 138 lb (62.6 kg)    Physical Exam Vitals reviewed.  Constitutional:      Appearance: She is well-developed.  Eyes:     Conjunctiva/sclera: Conjunctivae normal.  Cardiovascular:     Rate and Rhythm: Normal rate and regular rhythm.     Pulses: Normal pulses.     Heart sounds: Normal heart sounds.  Pulmonary:     Effort: Pulmonary effort is normal.     Breath sounds: Normal breath sounds. No wheezing, rhonchi or rales.  Chest:     Breasts:        Left: Mass present. No nipple discharge, skin change or tenderness.     Comments: Firm, non tender 3-5cm well-circumscribed mass appreciated proximally 9:00 oclock on right breast. Skin:    General: Skin is warm and dry.  Neurological:     Mental Status: She is alert.  Psychiatric:        Speech: Speech normal.        Behavior: Behavior normal.        Thought Content: Thought content normal.        Assessment & Plan:   Problem List Items Addressed This Visit      Cardiovascular and Mediastinum   Essential hypertension    Stable, overall pleased with blood pressures less than 130.  Limited by further escalation of antihypertensives due to low diastolic blood pressure.  I am pleased with overall blood pressure and goal being less than mid 834H systolic.  Patient will continue to monitor at home and let me know of any concerns        Other   Breast mass, right - Primary    History of left breast cancer.  Pending diagnostic images of right breast.  After patient completes records request form with [facility], we will schedule . Detailed instructions given on AVS      Relevant Orders   MM DIAG BREAST TOMO UNI RIGHT   US BREAST LTD UNI RIGHT INC AXILLA       I am having Joaquim Lai A. Yates maintain her Calcium  Citrate-Vitamin D, cholecalciferol, fluticasone, aspirin EC, Tiotropium Bromide Monohydrate, atorvastatin, metoprolol tartrate, traZODone, amLODipine, and lisinopril.   No orders of the defined types were placed in this encounter.   Return precautions given.   Risks, benefits, and alternatives of the medications and treatment plan prescribed today  were discussed, and patient expressed understanding.   Education regarding symptom management and diagnosis given to patient on AVS.  Continue to follow with [name], FNP for routine health maintenance.   Natalie [name] and I agreed with plan.   Mable Paris, FNP

## 2019-10-21 NOTE — Telephone Encounter (Signed)
pt called in stated that she was told to drink water by [facility] and come back for lab  later so pt called and stated that she had to on Wednesday morning

## 2019-10-21 NOTE — Assessment & Plan Note (Signed)
Stable, overall pleased with blood pressures less than 130.  Limited by further escalation of antihypertensives due to low diastolic blood pressure.  I am pleased with overall blood pressure and goal being less than mid 426S systolic.  Patient will continue to monitor at home and let me know of any concerns

## 2019-10-21 NOTE — Telephone Encounter (Signed)
Please place future orders for lab appt.  

## 2019-10-22 ENCOUNTER — Other Ambulatory Visit: Payer: Self-pay | Admitting: *Deleted

## 2019-10-22 ENCOUNTER — Other Ambulatory Visit (INDEPENDENT_AMBULATORY_CARE_PROVIDER_SITE_OTHER): Payer: Medicare Other

## 2019-10-22 DIAGNOSIS — E785 Hyperlipidemia, unspecified: Secondary | ICD-10-CM

## 2019-10-22 DIAGNOSIS — I10 Essential (primary) hypertension: Secondary | ICD-10-CM | POA: Diagnosis not present

## 2019-10-22 LAB — LIPID PANEL
Cholesterol: 130 mg/dL (ref 0–200)
HDL: 51.6 mg/dL (ref >39.00)
LDL Cholesterol: 51 mg/dL (ref 0–99)
NonHDL: 78.05
Total CHOL/HDL Ratio: 3
Triglycerides: 133 mg/dL (ref 0.0–149.0)
VLDL: 26.6 mg/dL (ref 0.0–40.0)

## 2019-10-22 LAB — COMPREHENSIVE METABOLIC PANEL
ALT: 17 U/L (ref 0–35)
AST: 20 U/L (ref 0–37)
Albumin: 4.3 g/dL (ref 3.5–5.2)
Alkaline Phosphatase: 60 U/L (ref 39–117)
BUN: 19 mg/dL (ref 6–23)
CO2: 26 mEq/L (ref 19–32)
Calcium: 9.9 mg/dL (ref 8.4–10.5)
Chloride: 102 mEq/L (ref 96–112)
Creatinine, Ser: 1.2 mg/dL (ref 0.40–1.20)
GFR: 42.38 mL/min — ABNORMAL LOW (ref >60.00)
Glucose, Bld: 126 mg/dL — ABNORMAL HIGH (ref 70–99)
Potassium: 4 mEq/L (ref 3.5–5.1)
Sodium: 139 mEq/L (ref 135–145)
Total Bilirubin: 0.5 mg/dL (ref 0.2–1.2)
Total Protein: 6.9 g/dL (ref 6.0–8.3)

## 2019-11-10 ENCOUNTER — Telehealth: Payer: Self-pay | Admitting: Family

## 2019-11-10 NOTE — Telephone Encounter (Signed)
Patient returned office phone call about her mammogram referral.

## 2019-11-11 ENCOUNTER — Other Ambulatory Visit: Payer: Self-pay | Admitting: Family

## 2019-11-11 DIAGNOSIS — I10 Essential (primary) hypertension: Secondary | ICD-10-CM

## 2019-11-11 DIAGNOSIS — Z1231 Encounter for screening mammogram for malignant neoplasm of breast: Secondary | ICD-10-CM

## 2019-11-11 DIAGNOSIS — N6310 Unspecified lump in the right breast, unspecified quadrant: Secondary | ICD-10-CM

## 2019-11-12 NOTE — Telephone Encounter (Signed)
Please make sure the order has been esigned. It reads that on the order in pt chart. Thanks

## 2019-11-12 NOTE — Telephone Encounter (Signed)
Call pt mammogram order in chart  Please call  and schedule your 3D mammogram,    [facility]  [place], [place]

## 2019-11-13 NOTE — Telephone Encounter (Signed)
Sorry I mean to send to [employer] since patient has appointment with her tomorrow at 4p.

## 2019-11-13 NOTE — Telephone Encounter (Signed)
LMTCB

## 2019-11-13 NOTE — Telephone Encounter (Signed)
Im sorry this was an error on my part.

## 2019-11-13 NOTE — Telephone Encounter (Signed)
I do not see an appt on my schedule.

## 2019-11-18 ENCOUNTER — Other Ambulatory Visit: Payer: Self-pay | Admitting: Family

## 2019-11-18 ENCOUNTER — Other Ambulatory Visit: Payer: Self-pay

## 2019-11-18 DIAGNOSIS — Z1231 Encounter for screening mammogram for malignant neoplasm of breast: Secondary | ICD-10-CM

## 2019-11-18 DIAGNOSIS — N6310 Unspecified lump in the right breast, unspecified quadrant: Secondary | ICD-10-CM

## 2019-11-18 NOTE — Telephone Encounter (Signed)
Left detailed message letting patient know that her mammogram was ordered & gave her number to [facility] to call. I asked patient if she would call us back to let us know she did get our message.

## 2019-11-18 NOTE — Telephone Encounter (Signed)
Confused with notes Ensure pt aware I ordered mammogram She may sch

## 2019-11-18 NOTE — Telephone Encounter (Signed)
Pt order is showing. [facility] has noted on order to please esign. Please advise and Thank you!

## 2019-11-19 NOTE — Telephone Encounter (Signed)
Patient is scheduled for mammogram & [name] has signe order.

## 2019-11-21 DIAGNOSIS — Z9841 Cataract extraction status, right eye: Secondary | ICD-10-CM | POA: Diagnosis not present

## 2019-11-21 DIAGNOSIS — H5203 Hypermetropia, bilateral: Secondary | ICD-10-CM | POA: Diagnosis not present

## 2019-11-21 DIAGNOSIS — Z9842 Cataract extraction status, left eye: Secondary | ICD-10-CM | POA: Diagnosis not present

## 2019-11-21 DIAGNOSIS — H353132 Nonexudative age-related macular degeneration, bilateral, intermediate dry stage: Secondary | ICD-10-CM | POA: Diagnosis not present

## 2019-11-26 ENCOUNTER — Inpatient Hospital Stay
Admission: RE | Admit: 2019-11-26 | Discharge: 2019-11-26 | Disposition: A | Discharge status: Home / Self Care | Payer: Self-pay | Source: Ambulatory Visit | Attending: *Deleted | Admitting: *Deleted

## 2019-11-26 ENCOUNTER — Ambulatory Visit
Admission: RE | Admit: 2019-11-26 | Discharge: 2019-11-26 | Disposition: A | Discharge status: Home / Self Care | Payer: Medicare Other | Source: Ambulatory Visit | Attending: Family | Admitting: Family

## 2019-11-26 ENCOUNTER — Other Ambulatory Visit: Payer: Self-pay | Admitting: Family

## 2019-11-26 ENCOUNTER — Other Ambulatory Visit: Payer: Self-pay

## 2019-11-26 ENCOUNTER — Other Ambulatory Visit: Payer: Self-pay | Admitting: *Deleted

## 2019-11-26 DIAGNOSIS — N6310 Unspecified lump in the right breast, unspecified quadrant: Secondary | ICD-10-CM | POA: Diagnosis present

## 2019-11-26 DIAGNOSIS — Z1231 Encounter for screening mammogram for malignant neoplasm of breast: Secondary | ICD-10-CM

## 2019-11-26 DIAGNOSIS — N6315 Unspecified lump in the right breast, overlapping quadrants: Secondary | ICD-10-CM | POA: Diagnosis not present

## 2019-11-26 DIAGNOSIS — N6311 Unspecified lump in the right breast, upper outer quadrant: Secondary | ICD-10-CM | POA: Insufficient documentation

## 2019-11-26 DIAGNOSIS — R922 Inconclusive mammogram: Secondary | ICD-10-CM | POA: Diagnosis not present

## 2019-11-26 IMAGING — MG MM DIGITAL DIAGNOSTIC UNILAT*R* W/ TOMO W/ CAD
8 of 14 series · 8 of 34 positions shown · non-contrast
Comparison: Previous exam(s) from [ID] were obtained.

CLINICAL DATA: Patient with a history of LEFT mastectomy in [ID]
for recurrence of breast cancer detected in [ID]. Recurrence
RIGHT area. Patient denies any history of RIGHT breast surgery

EXAM:
DIGITAL DIAGNOSTIC UNILATERAL RIGHT MAMMOGRAM WITH TOMO AND CAD;
ULTRASOUND RIGHT BREAST LIMITED

[R LM (1 of 2)]
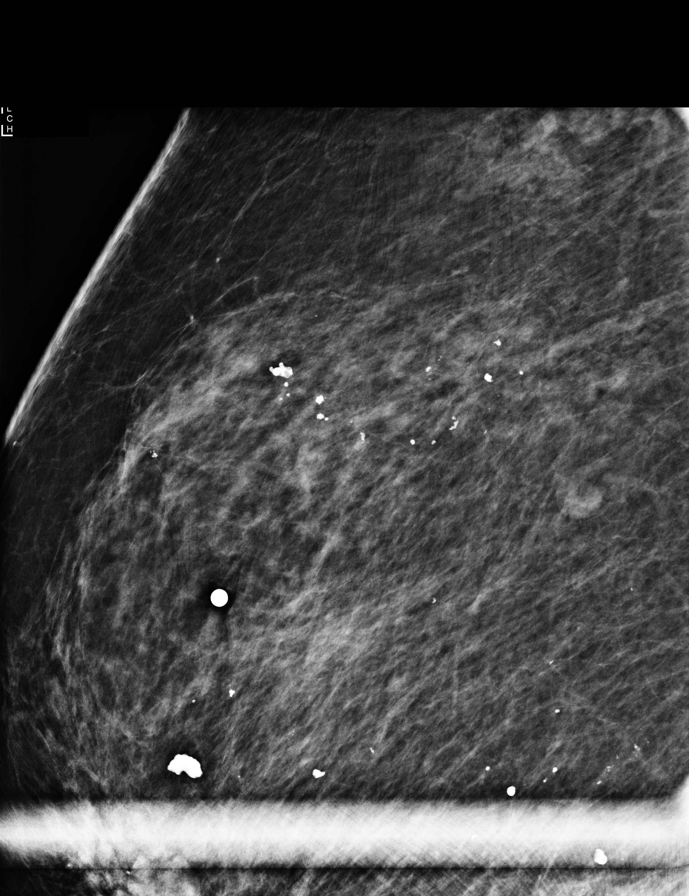

[R CC (1 of 2)]
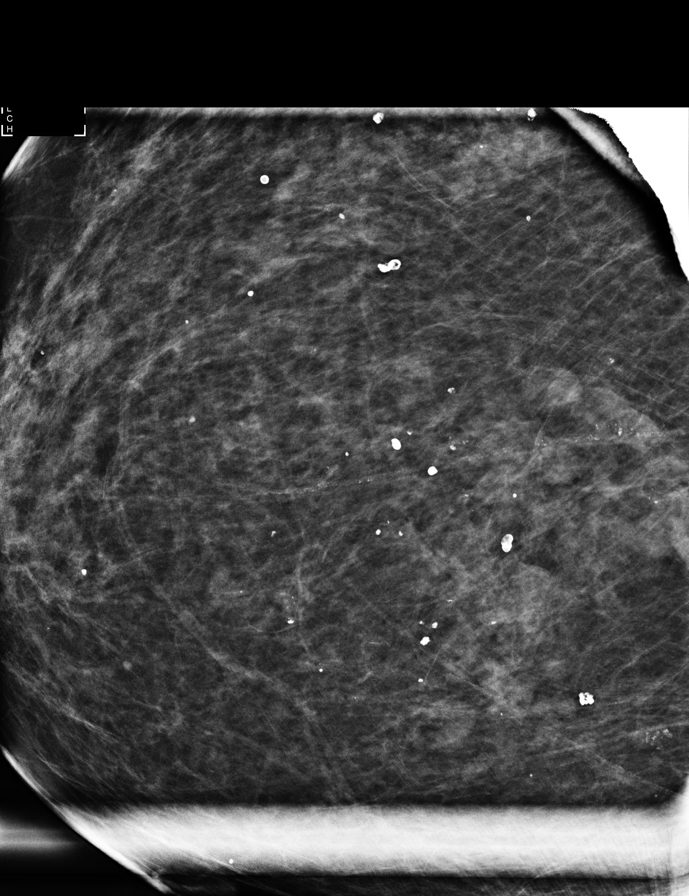

[R CC (2 of 2)]
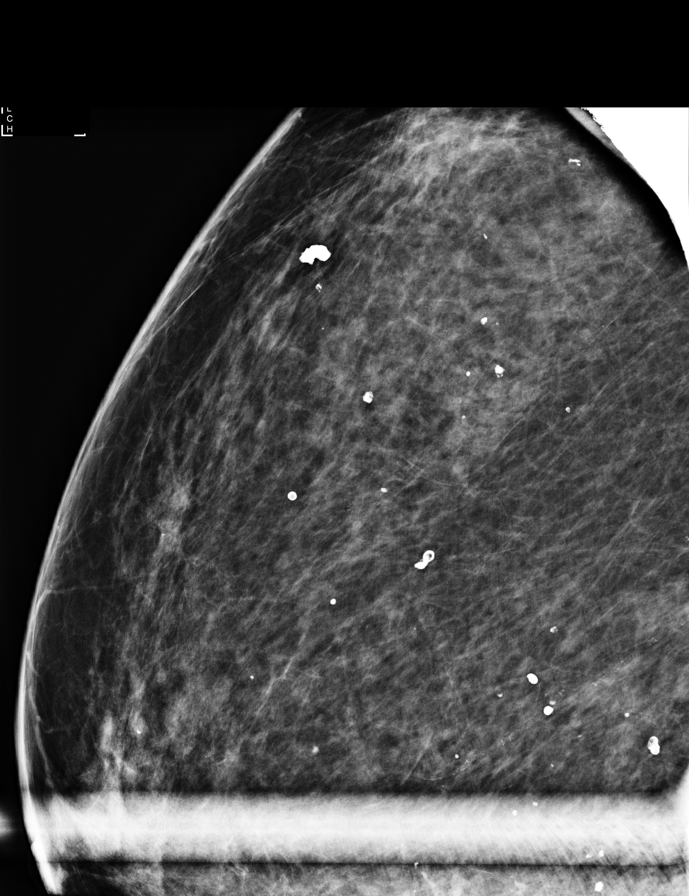

[R LM (2 of 2)]
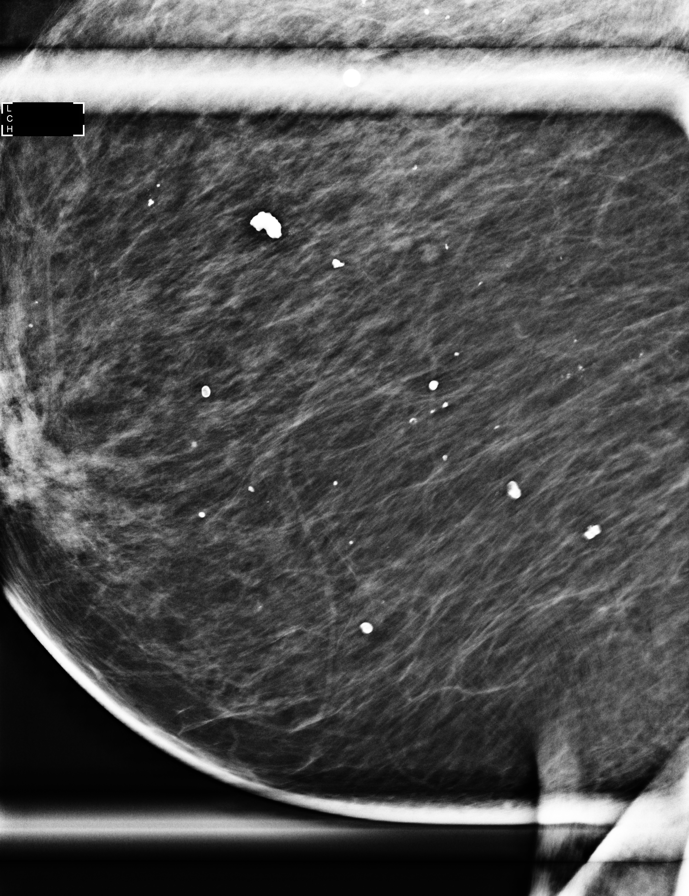

[R XCCL synth-2D]
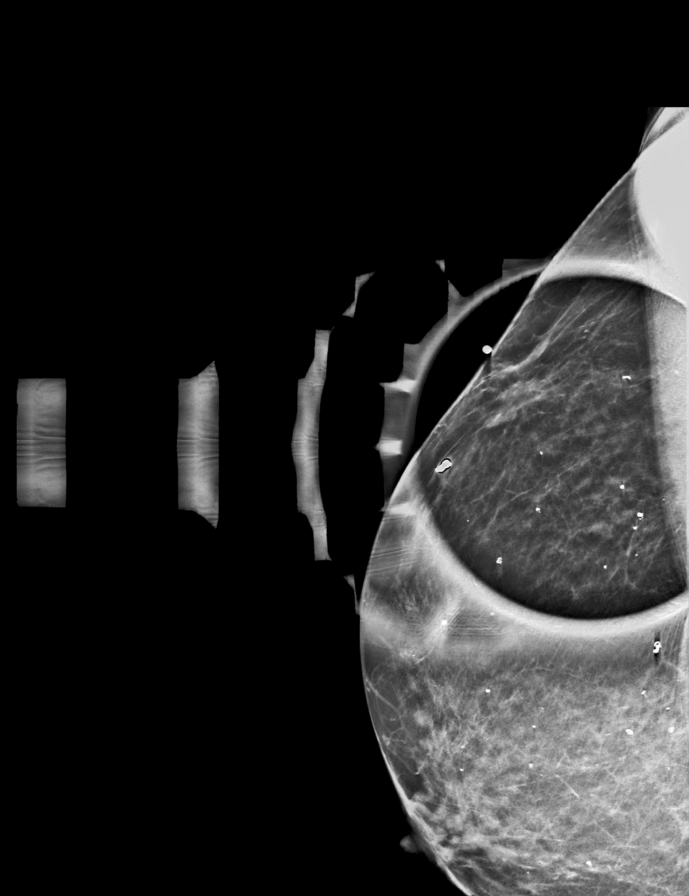

[R MLO synth-2D]
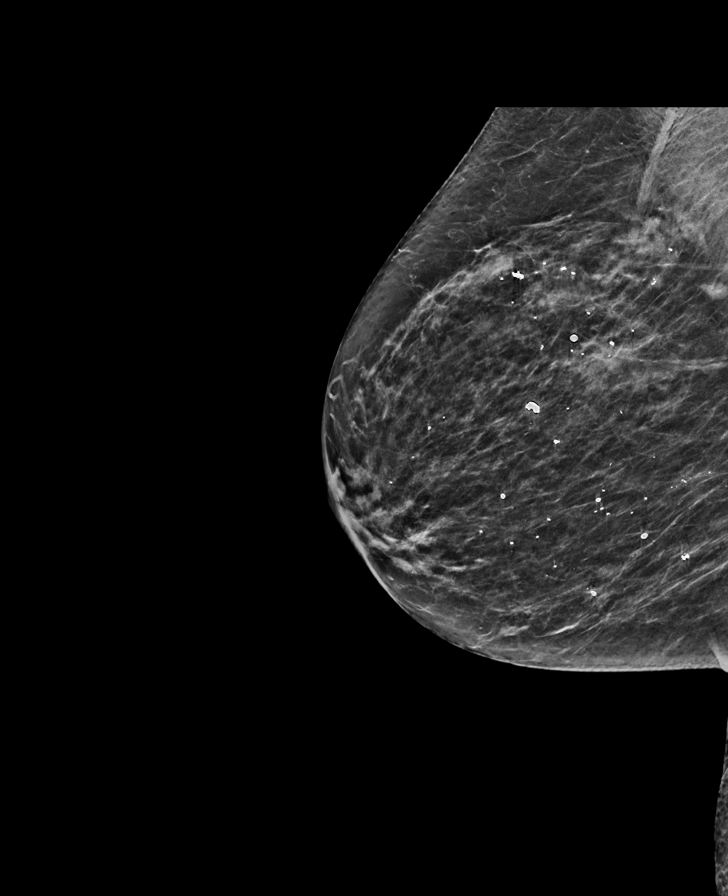

[R CC synth-2D]
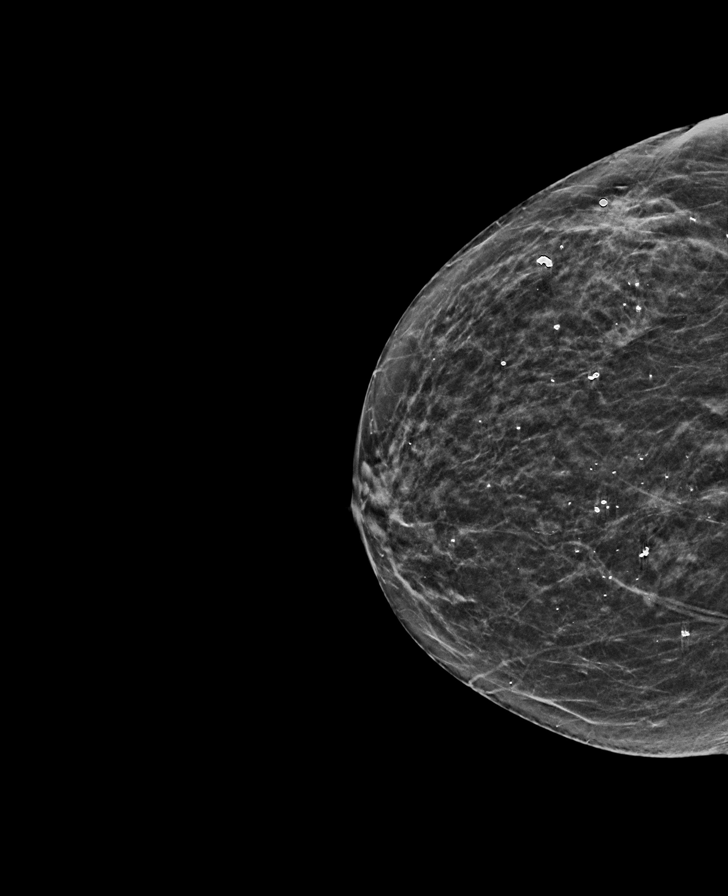

[R LM synth-2D]
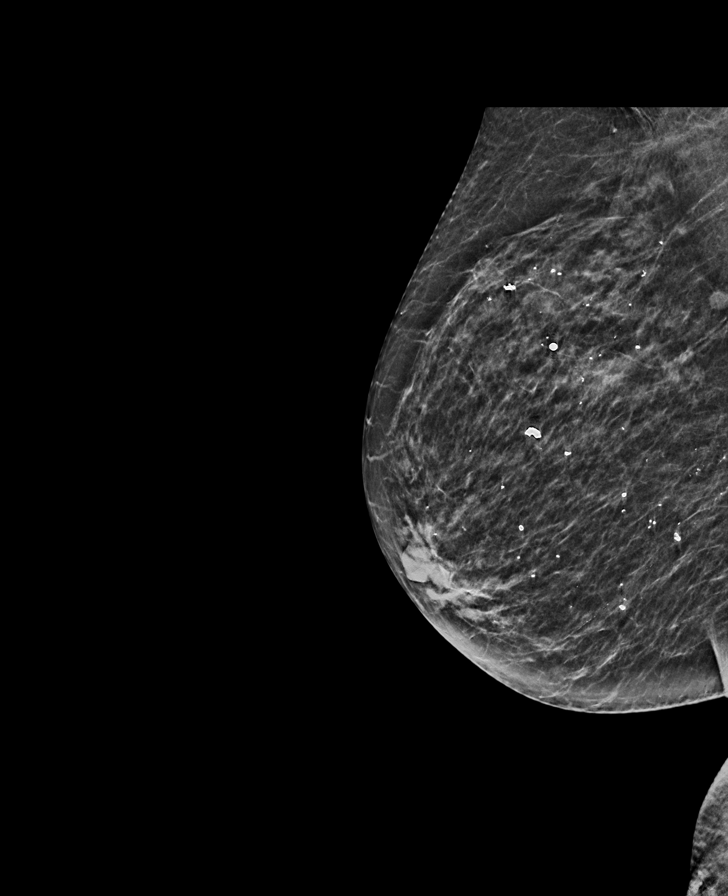

[8 of 34 positions shown; findings below may reference images not displayed]

ACR Breast Density Category c: The breast tissue is heterogeneously
dense, which may obscure small masses.
FINDINGS: At the site of palpable concern in the outer breast at posterior
depth, there is architectural distortion.

There are scattered and occasionally grouped calcifications
throughout the RIGHT breast. These coarse heterogeneous and popcorn
calcifications are mammographically similar in comparison to more
remote priors, consistent with a benign etiology.

Mammographic images were processed with CAD.

On physical exam, there is a hard mass in the RIGHT outer breast.

Targeted ultrasound was performed of the RIGHT upper outer breast.
At 9 o'clock 6 cm from the nipple there is an irregular hypoechoic
mass with indistinct margins which measures 14 x 12 x 13 mm.

Targeted ultrasound was performed of the RIGHT axilla. No suspicious
lymphadenopathy is visualized.
IMPRESSION: 1. Irregular mass with associated architectural distortion in the
RIGHT outer breast is concerning for malignancy. Recommend
ultrasound-guided biopsy.

2.  No suspicious RIGHT axillary adenopathy visualized.

RECOMMENDATION:
Ultrasound-guided biopsy x1 of the RIGHT breast.

I have discussed the findings and recommendations with the patient.
Patient is hesitant to pursue any further treatment. She desires to
discuss the biopsy with her daughter prior to scheduling. The
recommendation of biopsy was strongly urged. If patient agrees to
proceed with biopsy, she will be scheduled for biopsy at her
earliest convenience by the schedulers.

These results will be called to the ordering clinician or
representative by the Radiologist Assistant, and communication
documented in the PACS or [REDACTED].

BI-RADS CATEGORY  5: Highly suggestive of malignancy.

## 2019-11-26 IMAGING — US US BREAST*R* LIMITED INC AXILLA
1 series · 6 of 6 positions shown · non-contrast
Comparison: Previous exam(s) from [ID] were obtained.

CLINICAL DATA: Patient with a history of LEFT mastectomy in [ID]
for recurrence of breast cancer detected in [ID]. Recurrence
RIGHT area. Patient denies any history of RIGHT breast surgery

EXAM:
DIGITAL DIAGNOSTIC UNILATERAL RIGHT MAMMOGRAM WITH TOMO AND CAD;
ULTRASOUND RIGHT BREAST LIMITED

[Series 1: us breast*right* limited inc axilla · 0.06mm/px · 6 of 6 slices shown]
[im 1/6]
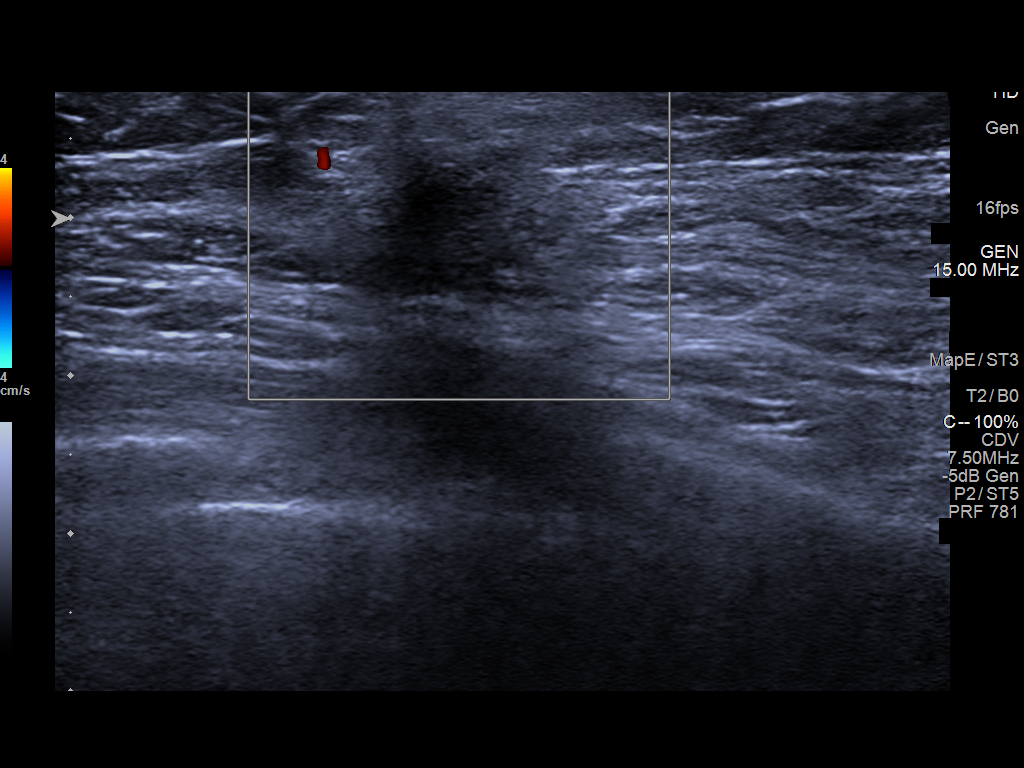
[im 2/6]
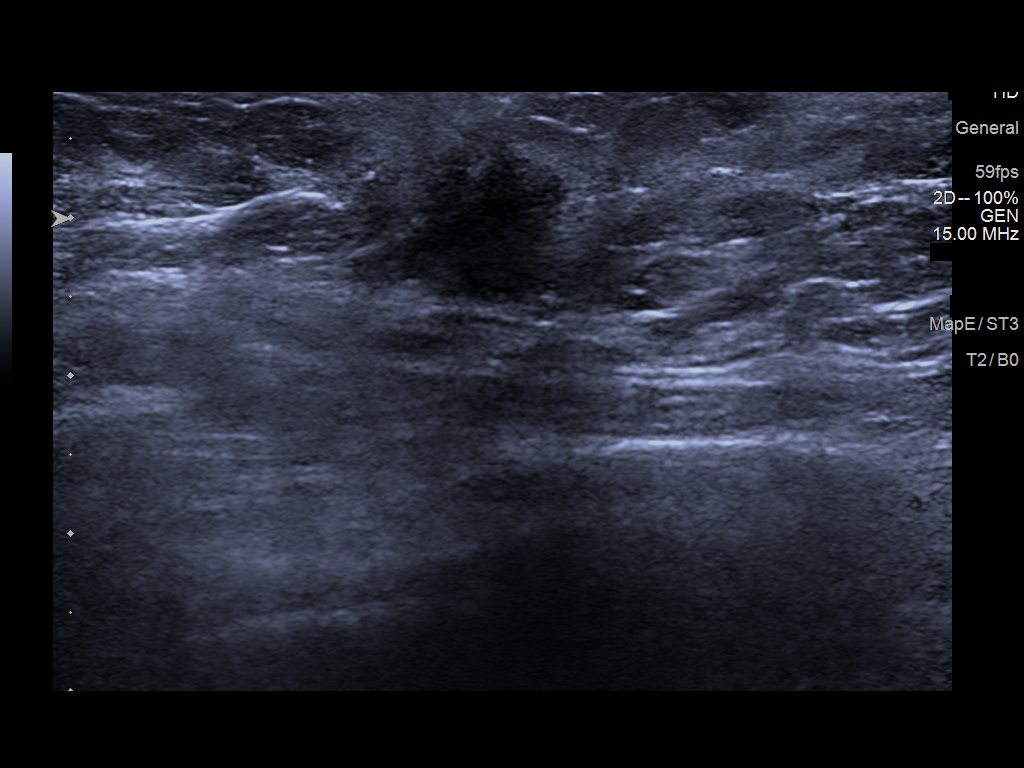
[im 3/6]
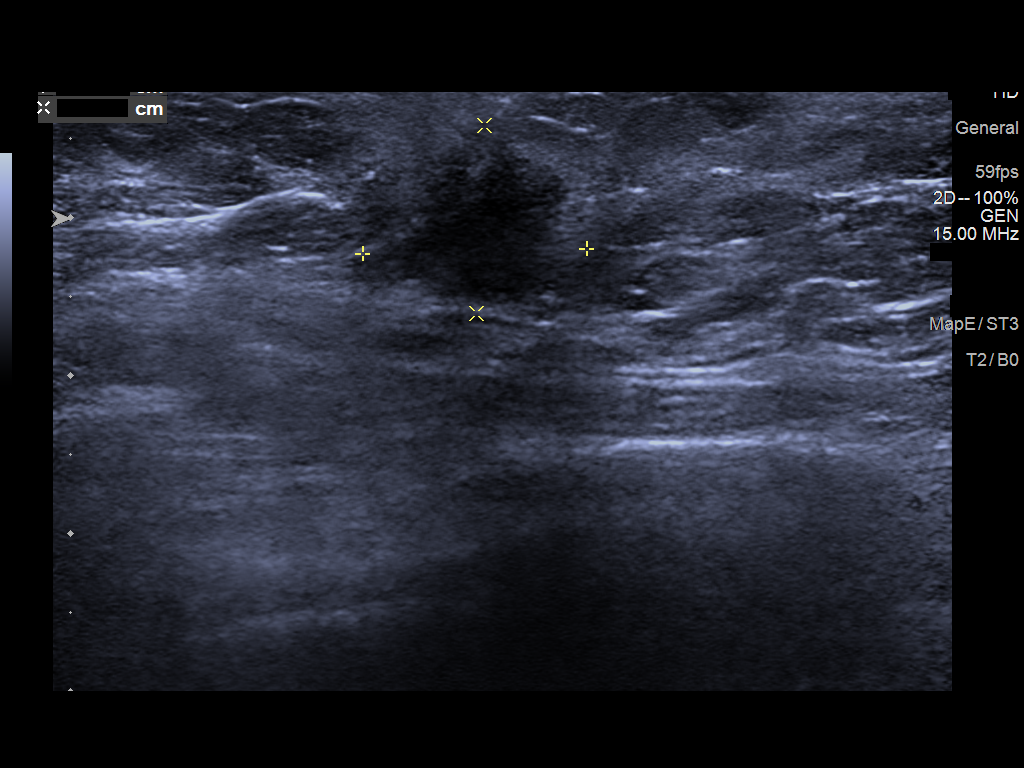
[im 4/6]
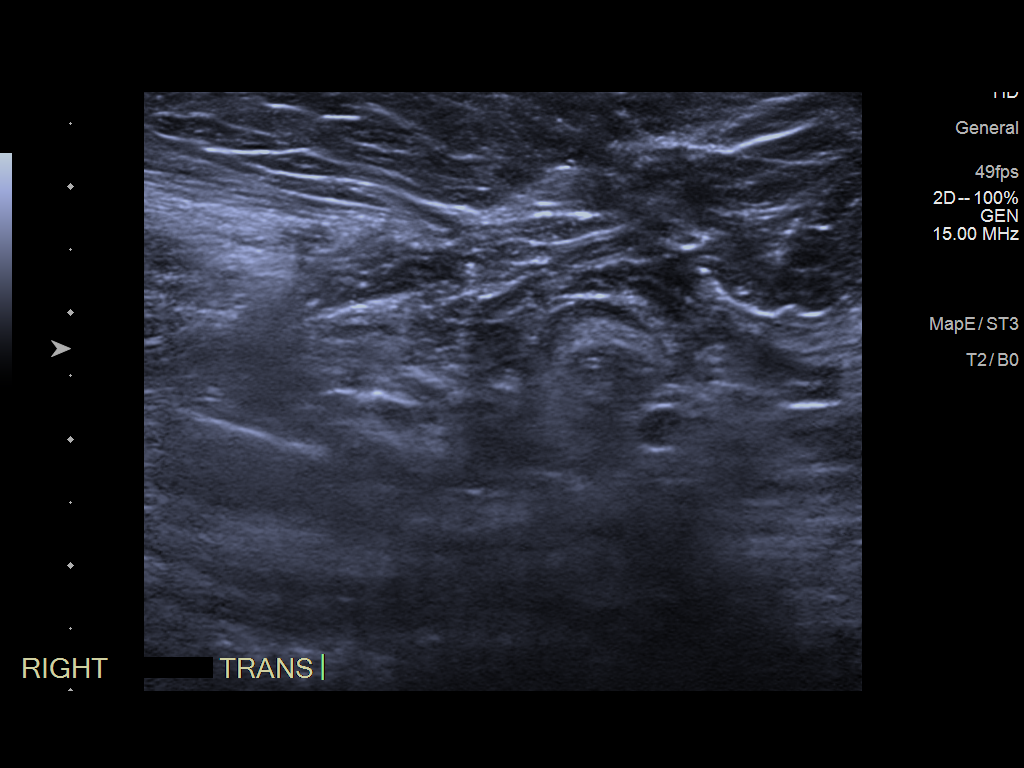
[im 5/6]
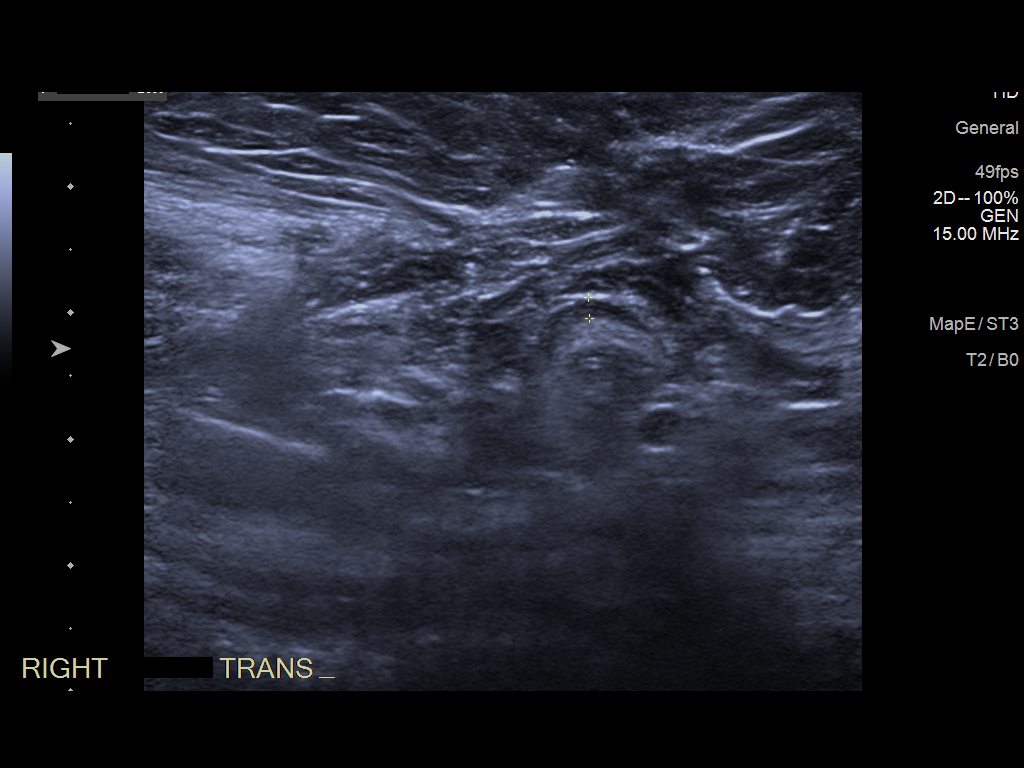
[im 6/6]
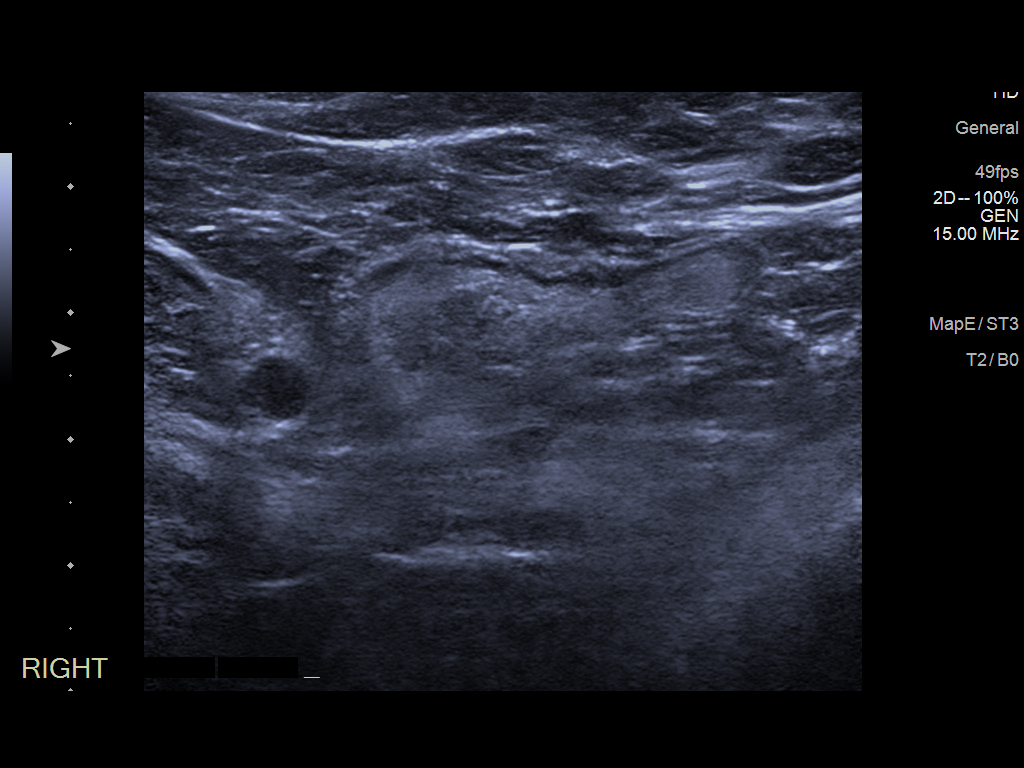

[6 of 6 positions shown; findings below may reference images not displayed]

ACR Breast Density Category c: The breast tissue is heterogeneously
dense, which may obscure small masses.
FINDINGS: At the site of palpable concern in the outer breast at posterior
depth, there is architectural distortion.

There are scattered and occasionally grouped calcifications
throughout the RIGHT breast. These coarse heterogeneous and popcorn
calcifications are mammographically similar in comparison to more
remote priors, consistent with a benign etiology.

Mammographic images were processed with CAD.

On physical exam, there is a hard mass in the RIGHT outer breast.

Targeted ultrasound was performed of the RIGHT upper outer breast.
At 9 o'clock 6 cm from the nipple there is an irregular hypoechoic
mass with indistinct margins which measures 14 x 12 x 13 mm.

Targeted ultrasound was performed of the RIGHT axilla. No suspicious
lymphadenopathy is visualized.
IMPRESSION: 1. Irregular mass with associated architectural distortion in the
RIGHT outer breast is concerning for malignancy. Recommend
ultrasound-guided biopsy.

2.  No suspicious RIGHT axillary adenopathy visualized.

RECOMMENDATION:
Ultrasound-guided biopsy x1 of the RIGHT breast.

I have discussed the findings and recommendations with the patient.
Patient is hesitant to pursue any further treatment. She desires to
discuss the biopsy with her daughter prior to scheduling. The
recommendation of biopsy was strongly urged. If patient agrees to
proceed with biopsy, she will be scheduled for biopsy at her
earliest convenience by the schedulers.

These results will be called to the ordering clinician or
representative by the Radiologist Assistant, and communication
documented in the PACS or [REDACTED].

BI-RADS CATEGORY  5: Highly suggestive of malignancy.

## 2019-11-27 ENCOUNTER — Other Ambulatory Visit: Payer: Self-pay | Admitting: Family

## 2019-11-27 DIAGNOSIS — N6310 Unspecified lump in the right breast, unspecified quadrant: Secondary | ICD-10-CM

## 2019-11-27 DIAGNOSIS — R928 Other abnormal and inconclusive findings on diagnostic imaging of breast: Secondary | ICD-10-CM

## 2019-11-28 ENCOUNTER — Telehealth: Payer: Self-pay

## 2019-11-28 NOTE — Telephone Encounter (Signed)
LMTCB to see if patient would like to pursue breast biopsy.

## 2019-12-02 NOTE — Telephone Encounter (Signed)
SPOKE TO PT REGARDING BX - PT STATED THAT AT HER AGE SHE REALLY DOES NOT WANT TO PURSUE THE BX.  SHE STATED THAT IF SHE CHANGED HER MIND, SHE WOULD CALL ME TO SCHEDULE THE APPT.

## 2019-12-08 ENCOUNTER — Other Ambulatory Visit: Payer: Self-pay | Admitting: Family

## 2019-12-16 ENCOUNTER — Telehealth: Payer: Self-pay | Admitting: Family

## 2019-12-16 NOTE — Telephone Encounter (Signed)
Pt called and wanted to go back over her mammogram results

## 2019-12-17 NOTE — Telephone Encounter (Signed)
LMTCB

## 2019-12-18 ENCOUNTER — Telehealth: Payer: Self-pay | Admitting: Family

## 2019-12-18 NOTE — Telephone Encounter (Signed)
LM once again to call back.  

## 2019-12-18 NOTE — Telephone Encounter (Signed)
lft vm for pt to call ofc to see if she wants to go forth with Bx on breast.

## 2019-12-19 ENCOUNTER — Telehealth: Payer: Self-pay

## 2019-12-19 ENCOUNTER — Ambulatory Visit (INDEPENDENT_AMBULATORY_CARE_PROVIDER_SITE_OTHER): Payer: Medicare Other

## 2019-12-19 VITALS — Ht 65.0 in | Wt 138.0 lb

## 2019-12-19 DIAGNOSIS — Z0000 Encounter for general adult medical examination without abnormal findings: Secondary | ICD-10-CM

## 2019-12-19 NOTE — Patient Instructions (Addendum)
[name] , Thank you for taking time to come for your Medicare Wellness Visit. I appreciate your ongoing commitment to your health goals. Please review the following plan we discussed and let me know if I can assist you in the future.   These are the goals we discussed: Goals    . Follow up with Primary Care Provider     As needed       This is a list of the screening recommended for you and due dates:  Health Maintenance  Topic Date Due  . Tetanus Vaccine  10/07/2028  . DEXA scan (bone density measurement)  Completed  . COVID-19 Vaccine  Completed  . Pneumonia vaccines  Completed  . Flu Shot  Discontinued    Immunizations Immunization History  Administered Date(s) Administered  . Influenza,inj,Quad PF,6+ Mos 12/31/2012  . Influenza-Unspecified 01/03/2011, 12/29/2011  . PFIZER SARS-COV-2 Vaccination 04/06/2019, 04/27/2019  . Pneumococcal Conjugate-13 08/19/2014  . Pneumococcal Polysaccharide-23 03/13/1996, 03/13/2017  . Tdap 10/08/2018  . Zoster 03/13/2004, 03/13/2006   Advanced directives: End of life planning; Advance aging; Advanced directives discussed.  Copy of current HCPOA/Living Will requested.    Conditions/risks identified: none new  Follow up in one year for your annual wellness visit.   Preventive Care 38 Years and Older, Female Preventive care refers to lifestyle choices and visits with your health care provider that can promote health and wellness. What does preventive care include?  A yearly physical exam. This is also called an annual well check.  Dental exams once or twice a year.  Routine eye exams. Ask your health care provider how often you should have your eyes checked.  Personal lifestyle choices, including:  Daily care of your teeth and gums.  Regular physical activity.  Eating a healthy diet.  Avoiding tobacco and drug use.  Limiting alcohol use.  Practicing safe sex.  Taking low-dose aspirin every day.  Taking vitamin and  mineral supplements as recommended by your health care provider. What happens during an annual well check? The services and screenings done by your health care provider during your annual well check will depend on your age, overall health, lifestyle risk factors, and family history of disease. Counseling  Your health care provider may ask you questions about your:  Alcohol use.  Tobacco use.  Drug use.  Emotional well-being.  Home and relationship well-being.  Sexual activity.  Eating habits.  History of falls.  Memory and ability to understand (cognition).  Work and work Statistician.  Reproductive health. Screening  You may have the following tests or measurements:  Height, weight, and BMI.  Blood pressure.  Lipid and cholesterol levels. These may be checked every 5 years, or more frequently if you are over 85 years old.  Skin check.  Lung cancer screening. You may have this screening every year starting at age 33 if you have a 30-pack-year history of smoking and currently smoke or have quit within the past 15 years.  Fecal occult blood test (FOBT) of the stool. You may have this test every year starting at age 23.  Flexible sigmoidoscopy or colonoscopy. You may have a sigmoidoscopy every 5 years or a colonoscopy every 10 years starting at age 30.  Hepatitis C blood test.  Hepatitis B blood test.  Sexually transmitted disease (STD) testing.  Diabetes screening. This is done by checking your blood sugar (glucose) after you have not eaten for a while (fasting). You may have this done every 1-3 years.  Bone density scan.  This is done to screen for osteoporosis. You may have this done starting at age 15.  Mammogram. This may be done every 1-2 years. Talk to your health care provider about how often you should have regular mammograms. Talk with your health care provider about your test results, treatment options, and if necessary, the need for more tests. Vaccines   Your health care provider may recommend certain vaccines, such as:  Influenza vaccine. This is recommended every year.  Tetanus, diphtheria, and acellular pertussis (Tdap, Td) vaccine. You may need a Td booster every 10 years.  Zoster vaccine. You may need this after age 68.  Pneumococcal 13-valent conjugate (PCV13) vaccine. One dose is recommended after age 41.  Pneumococcal polysaccharide (PPSV23) vaccine. One dose is recommended after age 88. Talk to your health care provider about which screenings and vaccines you need and how often you need them. This information is not intended to replace advice given to you by your health care provider. Make sure you discuss any questions you have with your health care provider. Document Released: 03/26/2015 Document Revised: 11/17/2015 Document Reviewed: 12/29/2014 Elsevier Interactive Patient Education  2017 [place] Prevention in the Home Falls can cause injuries. They can happen to people of all ages. There are many things you can do to make your home safe and to help prevent falls. What can I do on the outside of my home?  Regularly fix the edges of walkways and driveways and fix any cracks.  Remove anything that might make you trip as you walk through a door, such as a raised step or threshold.  Trim any bushes or trees on the path to your home.  Use bright outdoor lighting.  Clear any walking paths of anything that might make someone trip, such as rocks or tools.  Regularly check to see if handrails are loose or broken. Make sure that both sides of any steps have handrails.  Any raised decks and porches should have guardrails on the edges.  Have any leaves, snow, or ice cleared regularly.  Use sand or salt on walking paths during winter.  Clean up any spills in your garage right away. This includes oil or grease spills. What can I do in the bathroom?  Use night lights.  Install grab bars by the toilet and in the  tub and shower. Do not use towel bars as grab bars.  Use non-skid mats or decals in the tub or shower.  If you need to sit down in the shower, use a plastic, non-slip stool.  Keep the floor dry. Clean up any water that spills on the floor as soon as it happens.  Remove soap buildup in the tub or shower regularly.  Attach bath mats securely with double-sided non-slip rug tape.  Do not have throw rugs and other things on the floor that can make you trip. What can I do in the bedroom?  Use night lights.  Make sure that you have a light by your bed that is easy to reach.  Do not use any sheets or blankets that are too big for your bed. They should not hang down onto the floor.  Have a firm chair that has side arms. You can use this for support while you get dressed.  Do not have throw rugs and other things on the floor that can make you trip. What can I do in the kitchen?  Clean up any spills right away.  Avoid walking on wet floors.  Keep items that you use a lot in easy-to-reach places.  If you need to reach something above you, use a strong step stool that has a grab bar.  Keep electrical cords out of the way.  Do not use floor polish or wax that makes floors slippery. If you must use wax, use non-skid floor wax.  Do not have throw rugs and other things on the floor that can make you trip. What can I do with my stairs?  Do not leave any items on the stairs.  Make sure that there are handrails on both sides of the stairs and use them. Fix handrails that are broken or loose. Make sure that handrails are as long as the stairways.  Check any carpeting to make sure that it is firmly attached to the stairs. Fix any carpet that is loose or worn.  Avoid having throw rugs at the top or bottom of the stairs. If you do have throw rugs, attach them to the floor with carpet tape.  Make sure that you have a light switch at the top of the stairs and the bottom of the stairs. If you  do not have them, ask someone to add them for you. What else can I do to help prevent falls?  Wear shoes that:  Do not have high heels.  Have rubber bottoms.  Are comfortable and fit you well.  Are closed at the toe. Do not wear sandals.  If you use a stepladder:  Make sure that it is fully opened. Do not climb a closed stepladder.  Make sure that both sides of the stepladder are locked into place.  Ask someone to hold it for you, if possible.  Clearly mark and make sure that you can see:  Any grab bars or handrails.  First and last steps.  Where the edge of each step is.  Use tools that help you move around (mobility aids) if they are needed. These include:  Canes.  Walkers.  Scooters.  Crutches.  Turn on the lights when you go into a dark area. Replace any light bulbs as soon as they burn out.  Set up your furniture so you have a clear path. Avoid moving your furniture around.  If any of your floors are uneven, fix them.  If there are any pets around you, be aware of where they are.  Review your medicines with your doctor. Some medicines can make you feel dizzy. This can increase your chance of falling. Ask your doctor what other things that you can do to help prevent falls. This information is not intended to replace advice given to you by your health care provider. Make sure you discuss any questions you have with your health care provider. Document Released: 12/24/2008 Document Revised: 08/05/2015 Document Reviewed: 04/03/2014 Elsevier Interactive Patient Education  2017 [employer].

## 2019-12-19 NOTE — Progress Notes (Addendum)
Subjective:   Natalie [name] is a 86 y.o. female who presents for an Initial Medicare Annual Wellness Visit.  Review of Systems    No ROS.  Medicare Wellness Virtual Visit.  Cardiac Risk Factors include: advanced age (>94men, >110 women);hypertension     Objective:    Today's Vitals   12/19/19 0836  Weight: 138 lb (62.6 kg)  Height: 5\' 5"  (1.651 m)   Body mass index is 22.96 kg/m.  Advanced Directives 12/19/2019 03/02/2019 02/28/2019 02/25/2019  Does Patient Have a Medical Advance Directive? Yes Yes Yes No  Type of Paramedic of [place];Living will Living will;Healthcare Power of Attorney Living will;Healthcare Power of Attorney -  Does patient want to make changes to medical advance directive? No - Patient declined No - Patient declined - -  Copy of [place] in Chart? No - copy requested Yes - validated most recent copy scanned in chart (See row information) Yes - validated most recent copy scanned in chart (See row information) -  Would patient like information on creating a medical advance directive? - No - Patient declined No - Patient declined No - Patient declined    Current Medications (verified) Outpatient Encounter Medications as of 12/19/2019  Medication Sig  . amLODipine (NORVASC) 10 MG tablet TAKE 1 TABLET BY MOUTH EVERY DAY  . aspirin EC 81 MG tablet Take 81 mg by mouth daily. In preparation for surgery 12/18/ After stopping plavix  . atorvastatin (LIPITOR) 40 MG tablet Take 1 tablet (40 mg total) by mouth daily.  . Calcium Citrate-Vitamin D 250-200 MG-UNIT TABS Take 250 mg by mouth every morning.  . cholecalciferol (VITAMIN D3) 25 MCG (1000 UT) tablet Take 1,000 Units by mouth daily.   . fluticasone (FLONASE) 50 MCG/ACT nasal spray Place 2 sprays into both nostrils daily.  [place] lisinopril (ZESTRIL) 5 MG tablet TAKE ONE TABLET (5 MG DOSE) BY MOUTH DAILY.  . metoprolol tartrate (LOPRESSOR) 50 MG tablet TAKE 0.5 TABLETS (25  MG TOTAL) BY MOUTH 2 (TWO) TIMES DAILY.  [place] Kitchen Tiotropium Bromide Monohydrate 2.5 MCG/ACT AERS Inhale 2 puffs into the lungs daily.  . traZODone (DESYREL) 50 MG tablet TAKE 0.5 TABLETS (25 MG TOTAL) BY MOUTH AT BEDTIME.   No facility-administered encounter medications on file as of 12/19/2019.    Allergies (verified) Influenza vaccines   History: Past Medical History:  Diagnosis Date  . Allergies    hay fever  . Arthritis   . Breast cancer, left ([place]) 2015   Left total mastectomy  . COPD (chronic obstructive pulmonary disease) ([place])   . GERD (gastroesophageal reflux disease)   . Hyperlipemia   . Hypertension   . Stroke [facility]) 2002   TIA   Past Surgical History:  Procedure Laterality Date  . BREAST BIOPSY  2014  . BREAST LUMPECTOMY  2014   left side  . ENDARTERECTOMY Left 02/28/2019   Procedure: ENDARTERECTOMY CAROTID;  Surgeon: Katha Cabal, MD;  Location: [facility] ORS;  Service: Vascular;  Laterality: Left;  . EVACUATION OF CERVICAL HEMATOMA Left 02/28/2019   Procedure: EVACUATION OF HEMATOMA;  Surgeon: Katha Cabal, MD;  Location: [facility] ORS;  Service: Vascular;  Laterality: Left;  . EYE SURGERY Bilateral    cataract extractions  . JOINT REPLACEMENT    . MASTECTOMY Left May 9th 2017  . RHINOPLASTY  1956  . TONSILLECTOMY  1939  . TOTAL HIP ARTHROPLASTY Right    head of the femur replaced due to fracture  Family History  Problem Relation Age of Onset  . Breast cancer Mother   . Lung cancer Father    Social History   Socioeconomic History  . Marital status: Single    Spouse name: Not on file  . Number of children: Not on file  . Years of education: Not on file  . Highest education level: Not on file  Occupational History  . Occupation: Immunologist    Comment: retired  Tobacco Use  . Smoking status: Former Research scientist (life sciences)  . Smokeless tobacco: Never Used  . Tobacco comment: 20 years ago  Vaping Use  . Vaping Use: Never used  Substance and Sexual Activity  . Alcohol  use: Yes    Comment: occassionally  . Drug use: Never  . Sexual activity: Not Currently  Other Topics Concern  . Not on file  Social History Narrative   Patient lives with her daughter   Social Determinants of Health   Financial Resource Strain:   . Difficulty of Paying Living Expenses: Not on file  Food Insecurity:   . Worried About Charity fundraiser in the Last Year: Not on file  . Ran Out of Food in the Last Year: Not on file  Transportation Needs:   . Lack of Transportation (Medical): Not on file  . Lack of Transportation (Non-Medical): Not on file  Physical Activity:   . Days of Exercise per Week: Not on file  . Minutes of Exercise per Session: Not on file  Stress:   . Feeling of Stress : Not on file  Social Connections:   . Frequency of Communication with Friends and Family: Not on file  . Frequency of Social Gatherings with Friends and Family: Not on file  . Attends Religious Services: Not on file  . Active Member of Clubs or Organizations: Not on file  . Attends Archivist Meetings: Not on file  . Marital Status: Not on file    Tobacco Counseling Counseling given: Not Answered Comment: 20 years ago   Clinical Intake:  Pre-visit preparation completed: Yes        Diabetes: No  How often do you need to have someone help you when you read instructions, pamphlets, or other written materials from your doctor or pharmacy?: 1 - Never  Interpreter Needed?: No      Activities of Daily Living In your present state of health, do you have any difficulty performing the following activities: 12/19/2019 02/28/2019  Hearing? Y Y  Comment - wears hearing aides  Vision? N N  Difficulty concentrating or making decisions? N N  Walking or climbing stairs? Y N  Comment Unsteady gait. Walker/rollator in use as needed. -  Dressing or bathing? N N  Doing errands, shopping? N -  Preparing Food and eating ? N -  Using the Toilet? N -  In the past six months,  have you accidently leaked urine? N -  Do you have problems with loss of bowel control? N -  Managing your Medications? N -  Managing your Finances? N -  Housekeeping or managing your Housekeeping? N -  Some recent data might be hidden    Patient Care Team: [name], FNP as PCP - General (Family Medicine)  Indicate any recent Medical Services you may have received from other than [facility] providers in the past year (date may be approximate).     Assessment:   This is a routine wellness examination for Jhanae.  I connected with Evora today by  telephone and verified that I am speaking with the correct person using two identifiers. Location patient: home Location provider: work Persons participating in the virtual visit: patient, Marine scientist.    I discussed the limitations, risks, security and privacy concerns of performing an evaluation and management service by telephone and the availability of in person appointments. The patient expressed understanding and verbally consented to this telephonic visit.    Interactive audio and video telecommunications were attempted between this provider and patient, however failed, due to patient having technical difficulties OR patient did not have access to video capability.  We continued and completed visit with audio only.  Some vital signs may be absent or patient reported.   Hearing/Vision screen  Hearing Screening   125Hz  250Hz  500Hz  1000Hz  2000Hz  3000Hz  4000Hz  6000Hz  8000Hz   Right ear:           Left ear:           Comments: Hearing aid, bilateral   Vision Screening Comments: Wears corrective lenses Visual acuity not assessed, virtual visit.       Dietary issues and exercise activities discussed: Current Exercise Habits: Home exercise routine, Type of exercise: walking, Intensity: Mild Healthy diet  Good water intake  Goals    . Follow up with Primary Care Provider     As needed      Depression Screen PHQ 2/9 Scores  12/19/2019 08/13/2019 11/22/2018 02/27/2018  PHQ - 2 Score 0 1 0 1  PHQ- 9 Score - 2 2 3     Fall Risk Fall Risk  12/19/2019 10/21/2019 11/22/2018  Falls in the past year? 0 1 0  Number falls in past yr: 0 0 -  Injury with Fall? - 0 -  Risk for fall due to : - History of fall(s) History of fall(s)  Follow up Falls evaluation completed Falls evaluation completed Falls evaluation completed   Handrails in use when climbing stairs? Yes Home free of loose throw rugs in walkways, pet beds, electrical cords, etc? Yes  Adequate lighting in your home to reduce risk of falls? Yes   ASSISTIVE DEVICES UTILIZED TO PREVENT FALLS: Life alert? No  Use of a cane, walker or w/c? Yes   TIMED UP AND GO: Was the test performed? No . Virtual visit.   Cognitive Function:     6CIT Screen 12/19/2019  What Year? 0 points  What month? 0 points  What time? 0 points  Months in reverse 0 points    Immunizations Immunization History  Administered Date(s) Administered  . Influenza,inj,Quad PF,6+ Mos 12/31/2012  . Influenza-Unspecified 01/03/2011, 12/29/2011  . PFIZER SARS-COV-2 Vaccination 04/06/2019, 04/27/2019  . Pneumococcal Conjugate-13 08/19/2014  . Pneumococcal Polysaccharide-23 03/13/1996, 03/13/2017  . Tdap 10/08/2018  . Zoster 03/13/2004, 03/13/2006    Health Maintenance There are no preventive care reminders to display for this patient. Health Maintenance  Topic Date Due  . TETANUS/TDAP  10/07/2028  . DEXA SCAN  Completed  . COVID-19 Vaccine  Completed  . PNA vac Low Risk Adult  Completed  . INFLUENZA VACCINE  Discontinued    Dental Screening: Recommended annual dental exams for proper oral hygiene.  Community Resource Referral / Chronic Care Management: CRR required this visit?  No   CCM required this visit?  No      Plan:   Keep all routine maintenance appointments.   I have personally reviewed and noted the following in the patient's chart:   . Medical and social  history . Use of alcohol, tobacco or  illicit drugs  . Current medications and supplements . Functional ability and status . Nutritional status . Physical activity . Advanced directives . List of other physicians . Hospitalizations, surgeries, and ER visits in previous 12 months . Vitals . Screenings to include cognitive, depression, and falls . Referrals and appointments  In addition, I have reviewed and discussed with patient certain preventive protocols, quality metrics, and best practice recommendations. A written personalized care plan for preventive services as well as general preventive health recommendations were provided to patient via mail.     Varney Biles, LPN   [id number]    Agree with plan. Mable Paris, NP

## 2019-12-19 NOTE — Telephone Encounter (Signed)
Patient called to complete awv

## 2019-12-31 ENCOUNTER — Other Ambulatory Visit: Payer: Self-pay | Admitting: Family

## 2020-01-21 ENCOUNTER — Encounter: Payer: Self-pay | Admitting: Family

## 2020-02-08 ENCOUNTER — Other Ambulatory Visit: Payer: Self-pay | Admitting: Family

## 2020-02-08 DIAGNOSIS — I10 Essential (primary) hypertension: Secondary | ICD-10-CM

## 2020-02-25 ENCOUNTER — Ambulatory Visit: Payer: Medicare Other | Admitting: Family

## 2020-03-10 ENCOUNTER — Encounter: Payer: Self-pay | Admitting: Family

## 2020-03-10 ENCOUNTER — Ambulatory Visit: Payer: Medicare Other | Admitting: Family

## 2020-03-10 ENCOUNTER — Other Ambulatory Visit: Payer: Self-pay

## 2020-03-10 VITALS — BP 140/60 | HR 70 | Temp 98.0°F | Ht 65.0 in | Wt 137.2 lb

## 2020-03-10 DIAGNOSIS — I10 Essential (primary) hypertension: Secondary | ICD-10-CM | POA: Diagnosis not present

## 2020-03-10 DIAGNOSIS — I6522 Occlusion and stenosis of left carotid artery: Secondary | ICD-10-CM | POA: Diagnosis not present

## 2020-03-10 DIAGNOSIS — L989 Disorder of the skin and subcutaneous tissue, unspecified: Secondary | ICD-10-CM

## 2020-03-10 DIAGNOSIS — N6310 Unspecified lump in the right breast, unspecified quadrant: Secondary | ICD-10-CM | POA: Diagnosis not present

## 2020-03-10 NOTE — Patient Instructions (Signed)
Goal of blood pressure is less than 140/90. Please let me know if consistently greater than 140/90.  Nice to see you ,always  Referral to dermatology Let us know if you dont hear back within a week in regards to an appointment being scheduled.

## 2020-03-10 NOTE — Progress Notes (Signed)
Subjective:    Patient ID: Natalie [name], female    DOB: [birth year], 86 y.o.   MRN: [id number]  CC: Natalie [name] is a 86 y.o. female who presents today for follow up.   HPI: Feels well today No complaints.   She decided not to pursue right breast biopsy.She states that she would not opt to treat recurrence of breast cancer at this age of life.  Right breast mass is not larger.  No breast pain, skin changes. H/o left breast cancer,  left lumpectomy . Mother had breast cancer.   HTN- compliant with norvasc 10mg , lisinopril 5mg , metoprolol tartrate 50mg  BID. At home 140/60. Lowest 120/60, HR 66.    No cp, sob, leg swelling, dizziness.   Compliant with asa 81mg . H/o tia, CAD, carotid stenosis. No bleeding.   Would like referral for annual skin check. echocardiogram [id number] grade 1 diastolic dysfunction. EF 55%. Moderate aortic valve sclerosis  HISTORY:  Past Medical History:  Diagnosis Date  . Allergies    hay fever  . Arthritis   . Breast cancer, left ([place]) 2015   Left total mastectomy  . COPD (chronic obstructive pulmonary disease) ([place])   . GERD (gastroesophageal reflux disease)   . Hyperlipemia   . Hypertension   . Stroke [facility]) 2002   TIA   Past Surgical History:  Procedure Laterality Date  . BREAST BIOPSY  2014  . BREAST LUMPECTOMY  2014   left side  . ENDARTERECTOMY Left 02/28/2019   Procedure: ENDARTERECTOMY CAROTID;  Surgeon: Katha Cabal, MD;  Location: [facility] ORS;  Service: Vascular;  Laterality: Left;  . EVACUATION OF CERVICAL HEMATOMA Left 02/28/2019   Procedure: EVACUATION OF HEMATOMA;  Surgeon: Katha Cabal, MD;  Location: [facility] ORS;  Service: Vascular;  Laterality: Left;  . EYE SURGERY Bilateral    cataract extractions  . JOINT REPLACEMENT    . MASTECTOMY Left May 9th 2017  . RHINOPLASTY  1956  . TONSILLECTOMY  1939  . TOTAL HIP ARTHROPLASTY Right    head of the femur replaced due to fracture   Family History  Problem Relation Age  of Onset  . Breast cancer Mother   . Lung cancer Father     Allergies: Influenza vaccines Current Outpatient Medications on File Prior to Visit  Medication Sig Dispense Refill  . amLODipine (NORVASC) 10 MG tablet TAKE 1 TABLET BY MOUTH EVERY DAY 90 tablet 0  . aspirin EC 81 MG tablet Take 81 mg by mouth daily. In preparation for surgery 12/18/ After stopping plavix    . atorvastatin (LIPITOR) 40 MG tablet Take 1 tablet (40 mg total) by mouth daily. 90 tablet 3  . Calcium Citrate-Vitamin D 250-200 MG-UNIT TABS Take 250 mg by mouth every morning.    . cholecalciferol (VITAMIN D3) 25 MCG (1000 UT) tablet Take 1,000 Units by mouth daily.     . fluticasone (FLONASE) 50 MCG/ACT nasal spray Place 2 sprays into both nostrils daily.    Marland Kitchen lisinopril (ZESTRIL) 5 MG tablet TAKE ONE TABLET (5 MG DOSE) BY MOUTH DAILY. 90 tablet 0  . metoprolol tartrate (LOPRESSOR) 50 MG tablet TAKE 0.5 TABLETS (25 MG TOTAL) BY MOUTH 2 (TWO) TIMES DAILY. 90 tablet 0  . Tiotropium Bromide Monohydrate 2.5 MCG/ACT AERS Inhale 2 puffs into the lungs daily. 4 g 3  . traZODone (DESYREL) 50 MG tablet TAKE 0.5 TABLETS (25 MG TOTAL) BY MOUTH AT BEDTIME. 45 tablet 3   No current facility-administered medications on file prior  to visit.    Social History   Tobacco Use  . Smoking status: Former Games developer  . Smokeless tobacco: Never Used  . Tobacco comment: 20 years ago  Vaping Use  . Vaping Use: Never used  Substance Use Topics  . Alcohol use: Yes    Comment: occassionally  . Drug use: Never    Review of Systems  Constitutional: Negative for chills and fever.  Respiratory: Negative for cough.   Cardiovascular: Negative for chest pain and palpitations.  Gastrointestinal: Negative for nausea and vomiting.      Objective:    BP 140/60   Pulse 70   Temp 98 F (36.7 C)   Ht 5\' 5"  (1.651 m)   Wt 137 lb 3.2 oz (62.2 kg)   SpO2 95%   BMI 22.83 kg/m  BP Readings from Last 3 Encounters:  03/10/20 140/60  10/21/19  138/60  08/13/19 140/60   Wt Readings from Last 3 Encounters:  03/10/20 137 lb 3.2 oz (62.2 kg)  12/19/19 138 lb (62.6 kg)  10/21/19 138 lb 6.4 oz (62.8 kg)    Physical Exam Vitals reviewed.  Constitutional:      Appearance: She is well-developed and well-nourished.  Eyes:     Conjunctiva/sclera: Conjunctivae normal.  Cardiovascular:     Rate and Rhythm: Normal rate and regular rhythm.     Pulses: Normal pulses.     Heart sounds: Normal heart sounds.  Pulmonary:     Effort: Pulmonary effort is normal.     Breath sounds: Normal breath sounds. No wheezing, rhonchi or rales.  Skin:    General: Skin is warm and dry.  Neurological:     Mental Status: She is alert.  Psychiatric:        Mood and Affect: Mood and affect normal.        Speech: Speech normal.        Behavior: Behavior normal.        Thought Content: Thought content normal.        Assessment & Plan:   Problem List Items Addressed This Visit      Cardiovascular and Mediastinum   Carotid atherosclerosis    Continue aspirin and following with vascular.       Essential hypertension - Primary    Overall stable. Blood pressure at home acceptable. Opted not to increase anti hypertensive regimen due to low DBP. Patient will continue to monitor at home and at let me know of any escalations. Continue norvasc 10mg . Lisinopril 5mg  , metoprolol 50mg  BID.       Relevant Orders   Basic metabolic panel     Other   Breast mass, right    Patient declines having right breast mass biopsy or any further breast cancer surveillance.        Other Visit Diagnoses    Skin lesion       Relevant Orders   Ambulatory referral to Dermatology       I am having 12/21/19 A. Oborn maintain her Calcium Citrate-Vitamin D, cholecalciferol, fluticasone, aspirin EC, Tiotropium Bromide Monohydrate, atorvastatin, traZODone, amLODipine, lisinopril, and metoprolol tartrate.   No orders of the defined types were placed in this  encounter.   Return precautions given.   Risks, benefits, and alternatives of the medications and treatment plan prescribed today were discussed, and patient expressed understanding.   Education regarding symptom management and diagnosis given to patient on AVS.  Continue to follow with , FNP for routine health maintenance.  Natalie [name] and I agreed with plan.   Mable Paris, FNP

## 2020-03-13 ENCOUNTER — Other Ambulatory Visit: Payer: Self-pay | Admitting: Family

## 2020-03-14 NOTE — Assessment & Plan Note (Signed)
Patient declines having right breast mass biopsy or any further breast cancer surveillance.

## 2020-03-14 NOTE — Assessment & Plan Note (Signed)
Overall stable. Blood pressure at home acceptable. Opted not to increase anti hypertensive regimen due to low DBP. Patient will continue to monitor at home and at let me know of any escalations. Continue norvasc 10mg . Lisinopril 5mg  , metoprolol 50mg  BID.

## 2020-03-14 NOTE — Assessment & Plan Note (Signed)
Continue aspirin and following with vascular.

## 2020-03-15 ENCOUNTER — Telehealth: Payer: Self-pay

## 2020-03-15 NOTE — Telephone Encounter (Signed)
Pt called to let us know that she does take 3 BP medications & she doesn't have 4. The 4 tablets she was counting included a tylenol she takes. She takes the Lopressor, lisinopril & amlodipine.   Pt scheduled for BMP in one week.

## 2020-03-16 NOTE — Telephone Encounter (Signed)
noted 

## 2020-03-22 ENCOUNTER — Other Ambulatory Visit: Payer: Medicare Other

## 2020-03-26 ENCOUNTER — Other Ambulatory Visit: Payer: Self-pay

## 2020-03-26 ENCOUNTER — Other Ambulatory Visit (INDEPENDENT_AMBULATORY_CARE_PROVIDER_SITE_OTHER): Payer: Medicare Other

## 2020-03-26 DIAGNOSIS — I10 Essential (primary) hypertension: Secondary | ICD-10-CM | POA: Diagnosis not present

## 2020-03-26 LAB — BASIC METABOLIC PANEL
BUN: 21 mg/dL (ref 6–23)
CO2: 28 mEq/L (ref 19–32)
Calcium: 10 mg/dL (ref 8.4–10.5)
Chloride: 101 mEq/L (ref 96–112)
Creatinine, Ser: 1.12 mg/dL (ref 0.40–1.20)
GFR: 43.87 mL/min — ABNORMAL LOW (ref >60.00)
Glucose, Bld: 139 mg/dL — ABNORMAL HIGH (ref 70–99)
Potassium: 4.1 mEq/L (ref 3.5–5.1)
Sodium: 137 mEq/L (ref 135–145)

## 2020-03-27 ENCOUNTER — Other Ambulatory Visit: Payer: Self-pay | Admitting: Family

## 2020-03-29 ENCOUNTER — Other Ambulatory Visit: Payer: Self-pay | Admitting: Family

## 2020-03-29 DIAGNOSIS — D649 Anemia, unspecified: Secondary | ICD-10-CM

## 2020-04-13 ENCOUNTER — Other Ambulatory Visit (INDEPENDENT_AMBULATORY_CARE_PROVIDER_SITE_OTHER): Payer: Medicare Other

## 2020-04-13 ENCOUNTER — Other Ambulatory Visit: Payer: Self-pay

## 2020-04-13 DIAGNOSIS — D649 Anemia, unspecified: Secondary | ICD-10-CM

## 2020-04-13 LAB — CBC WITH DIFFERENTIAL/PLATELET
Basophils Absolute: 0.1 10*3/uL (ref 0.0–0.1)
Basophils Relative: 1.1 % (ref 0.0–3.0)
Eosinophils Absolute: 0.3 10*3/uL (ref 0.0–0.7)
Eosinophils Relative: 5.1 % — ABNORMAL HIGH (ref 0.0–5.0)
HCT: 37.9 % (ref 36.0–46.0)
Hemoglobin: 13 g/dL (ref 12.0–15.0)
Lymphocytes Relative: 22.9 % (ref 12.0–46.0)
Lymphs Abs: 1.4 10*3/uL (ref 0.7–4.0)
MCHC: 34.4 g/dL (ref 30.0–36.0)
MCV: 93.3 fl (ref 78.0–100.0)
Monocytes Absolute: 0.6 10*3/uL (ref 0.1–1.0)
Monocytes Relative: 9.3 % (ref 3.0–12.0)
Neutro Abs: 3.7 10*3/uL (ref 1.4–7.7)
Neutrophils Relative %: 61.6 % (ref 43.0–77.0)
Platelets: 176 10*3/uL (ref 150.0–400.0)
RBC: 4.06 Mil/uL (ref 3.87–5.11)
RDW: 13.3 % (ref 11.5–15.5)
WBC: 6 10*3/uL (ref 4.0–10.5)

## 2020-04-13 LAB — IBC + FERRITIN
Ferritin: 104.4 ng/mL (ref 10.0–291.0)
Iron: 83 ug/dL (ref 42–145)
Saturation Ratios: 28 % (ref 20.0–50.0)
Transferrin: 212 mg/dL (ref 212.0–360.0)

## 2020-04-13 LAB — B12 AND FOLATE PANEL
Folate: >23.6 ng/mL (ref >5.9)
Vitamin B-12: 245 pg/mL (ref 211–911)

## 2020-04-15 ENCOUNTER — Telehealth: Payer: Self-pay

## 2020-04-15 DIAGNOSIS — Z8673 Personal history of transient ischemic attack (TIA), and cerebral infarction without residual deficits: Secondary | ICD-10-CM

## 2020-04-15 NOTE — Telephone Encounter (Signed)
-----   Message from [name], [place] sent at 04/14/2020  2:55 PM EST ----- CALL-  No anemia. It has resolved. Good iron stores. B12 low end however still normal.  Eosinophils elevated which I suspect transient , please order and sch repeat cbc with diff in 6 weeks   Let me know if cannot reach patient.

## 2020-04-30 ENCOUNTER — Other Ambulatory Visit: Payer: Self-pay | Admitting: Family

## 2020-05-04 ENCOUNTER — Other Ambulatory Visit: Payer: Self-pay | Admitting: Family

## 2020-05-04 DIAGNOSIS — I10 Essential (primary) hypertension: Secondary | ICD-10-CM

## 2020-05-20 ENCOUNTER — Other Ambulatory Visit: Payer: Self-pay

## 2020-05-20 DIAGNOSIS — R7989 Other specified abnormal findings of blood chemistry: Secondary | ICD-10-CM

## 2020-05-25 ENCOUNTER — Other Ambulatory Visit: Payer: Self-pay

## 2020-05-25 ENCOUNTER — Other Ambulatory Visit (INDEPENDENT_AMBULATORY_CARE_PROVIDER_SITE_OTHER): Payer: Medicare Other

## 2020-05-25 DIAGNOSIS — R7989 Other specified abnormal findings of blood chemistry: Secondary | ICD-10-CM

## 2020-05-25 LAB — CBC WITH DIFFERENTIAL/PLATELET
Basophils Absolute: 0.1 10*3/uL (ref 0.0–0.1)
Basophils Relative: 1.2 % (ref 0.0–3.0)
Eosinophils Absolute: 0.4 10*3/uL (ref 0.0–0.7)
Eosinophils Relative: 5.4 % — ABNORMAL HIGH (ref 0.0–5.0)
HCT: 36.6 % (ref 36.0–46.0)
Hemoglobin: 12.4 g/dL (ref 12.0–15.0)
Lymphocytes Relative: 18.3 % (ref 12.0–46.0)
Lymphs Abs: 1.3 10*3/uL (ref 0.7–4.0)
MCHC: 33.7 g/dL (ref 30.0–36.0)
MCV: 94 fl (ref 78.0–100.0)
Monocytes Absolute: 0.5 10*3/uL (ref 0.1–1.0)
Monocytes Relative: 7.7 % (ref 3.0–12.0)
Neutro Abs: 4.6 10*3/uL (ref 1.4–7.7)
Neutrophils Relative %: 67.4 % (ref 43.0–77.0)
Platelets: 171 10*3/uL (ref 150.0–400.0)
RBC: 3.9 Mil/uL (ref 3.87–5.11)
RDW: 13.4 % (ref 11.5–15.5)
WBC: 6.9 10*3/uL (ref 4.0–10.5)

## 2020-06-08 ENCOUNTER — Other Ambulatory Visit: Payer: Self-pay

## 2020-06-08 ENCOUNTER — Encounter: Payer: Self-pay | Admitting: Family

## 2020-06-08 ENCOUNTER — Other Ambulatory Visit: Payer: Self-pay | Admitting: Family

## 2020-06-08 ENCOUNTER — Ambulatory Visit (INDEPENDENT_AMBULATORY_CARE_PROVIDER_SITE_OTHER): Payer: Medicare Other | Admitting: Family

## 2020-06-08 VITALS — BP 146/50 | HR 75 | Temp 97.6°F | Ht 65.0 in | Wt 133.8 lb

## 2020-06-08 DIAGNOSIS — F320 Major depressive disorder, single episode, mild: Secondary | ICD-10-CM | POA: Diagnosis not present

## 2020-06-08 DIAGNOSIS — I10 Essential (primary) hypertension: Secondary | ICD-10-CM

## 2020-06-08 DIAGNOSIS — G459 Transient cerebral ischemic attack, unspecified: Secondary | ICD-10-CM

## 2020-06-08 NOTE — Assessment & Plan Note (Signed)
Controlled, in particular at home, 130-140 60-70. DBP low and we agreed no changes to regimen at this time. Continue  norvasc 10mg , lisinopril 5mg  , metoprolol 50mg  BID.

## 2020-06-08 NOTE — Progress Notes (Signed)
Subjective:    Patient ID: Natalie [name], female    DOB: [birth year], 87 y.o.   MRN: [id number]  CC: Natalie [name] is a 87 y.o. female who presents today for follow up.   HPI: Feels well today  No new concerns  She H/o tia- compliant with asa No falls, bleeding  HTN- compliant with norvasc 10mg , lisinopril 5mg  , metoprolol 50mg  BID.  At home 130-140/ 60-70. No sudden vision changes, cp, ha, leg swelling.  No nsaid use.    She has been told she has macular degeneration in the left eye. Follows with ophthalmology, Natalie [name] well on trazodone. Depression controlled.   lab in 6 weeks time to repeat CBC due to elevated eosinophils  Follow up with Natalie [name] 07/2020   HISTORY:  Past Medical History:  Diagnosis Date  . Allergies    hay fever  . Arthritis   . Breast cancer, left ([place]) 2015   Left total mastectomy  . COPD (chronic obstructive pulmonary disease) ([place])   . GERD (gastroesophageal reflux disease)   . Hyperlipemia   . Hypertension   . Stroke [facility]) 2002   TIA   Past Surgical History:  Procedure Laterality Date  . BREAST BIOPSY  2014  . BREAST LUMPECTOMY  2014   left side  . ENDARTERECTOMY Left 02/28/2019   Procedure: ENDARTERECTOMY CAROTID;  Surgeon: Katha Cabal, MD;  Location: [facility] ORS;  Service: Vascular;  Laterality: Left;  . EVACUATION OF CERVICAL HEMATOMA Left 02/28/2019   Procedure: EVACUATION OF HEMATOMA;  Surgeon: Katha Cabal, MD;  Location: [facility] ORS;  Service: Vascular;  Laterality: Left;  . EYE SURGERY Bilateral    cataract extractions  . JOINT REPLACEMENT    . MASTECTOMY Left May 9th 2017  . RHINOPLASTY  1956  . TONSILLECTOMY  1939  . TOTAL HIP ARTHROPLASTY Right    head of the femur replaced due to fracture   Family History  Problem Relation Age of Onset  . Breast cancer Mother   . Lung cancer Father     Allergies: Influenza vaccines Current Outpatient Medications on File Prior to Visit  Medication Sig  Dispense Refill  . aspirin EC 81 MG tablet Take 81 mg by mouth daily. In preparation for surgery 12/18/ After stopping plavix    . atorvastatin (LIPITOR) 40 MG tablet TAKE 1 TABLET BY MOUTH EVERY DAY 90 tablet 3  . Calcium Citrate-Vitamin D 250-200 MG-UNIT TABS Take 250 mg by mouth every morning.    . cholecalciferol (VITAMIN D3) 25 MCG (1000 UT) tablet Take 1,000 Units by mouth daily.     . fluticasone (FLONASE) 50 MCG/ACT nasal spray Place 2 sprays into both nostrils daily.    Marland Kitchen lisinopril (ZESTRIL) 5 MG tablet TAKE ONE TABLET (5 MG DOSE) BY MOUTH DAILY. 90 tablet 0  . metoprolol tartrate (LOPRESSOR) 50 MG tablet TAKE 0.5 TABLETS (25 MG TOTAL) BY MOUTH 2 (TWO) TIMES DAILY. 90 tablet 0  . Tiotropium Bromide Monohydrate 2.5 MCG/ACT AERS Inhale 2 puffs into the lungs daily. 4 g 3  . traZODone (DESYREL) 50 MG tablet TAKE 0.5 TABLETS (25 MG TOTAL) BY MOUTH AT BEDTIME. 45 tablet 3   No current facility-administered medications on file prior to visit.    Social History   Tobacco Use  . Smoking status: Former Research scientist (life sciences)  . Smokeless tobacco: Never Used  . Tobacco comment: 20 years ago  Vaping Use  . Vaping Use: Never used  Substance Use Topics  .  Alcohol use: Yes    Comment: occassionally  . Drug use: Never    Review of Systems  Constitutional: Negative for chills and fever.  Eyes: Positive for visual disturbance (left eye, chronic).  Respiratory: Negative for cough.   Cardiovascular: Negative for chest pain and palpitations.  Gastrointestinal: Negative for nausea and vomiting.  Psychiatric/Behavioral: Negative for sleep disturbance.      Objective:    BP (!) 146/50   Pulse 75   Temp 97.6 F (36.4 C) (Oral)   Ht 5\' 5"  (1.651 m)   Wt 133 lb 12.8 oz (60.7 kg)   SpO2 96%   BMI 22.27 kg/m  BP Readings from Last 3 Encounters:  06/08/20 (!) 146/50  03/10/20 140/60  10/21/19 138/60   Wt Readings from Last 3 Encounters:  06/08/20 133 lb 12.8 oz (60.7 kg)  03/10/20 137 lb 3.2 oz  (62.2 kg)  12/19/19 138 lb (62.6 kg)    Physical Exam Vitals reviewed.  Constitutional:      Appearance: She is well-developed.  Eyes:     Conjunctiva/sclera: Conjunctivae normal.  Cardiovascular:     Rate and Rhythm: Normal rate and regular rhythm.     Pulses: Normal pulses.     Heart sounds: Normal heart sounds.  Pulmonary:     Effort: Pulmonary effort is normal.     Breath sounds: Normal breath sounds. No wheezing, rhonchi or rales.  Musculoskeletal:     Right lower leg: No edema.     Left lower leg: No edema.  Skin:    General: Skin is warm and dry.  Neurological:     Mental Status: She is alert.  Psychiatric:        Speech: Speech normal.        Behavior: Behavior normal.        Thought Content: Thought content normal.        Assessment & Plan:   Problem List Items Addressed This Visit      Cardiovascular and Mediastinum   Essential hypertension - Primary    Controlled, in particular at home, 130-140 60-70. DBP low and we agreed no changes to regimen at this time. Continue  norvasc 10mg , lisinopril 5mg  , metoprolol 50mg  BID.      Relevant Orders   Comprehensive metabolic panel   TIA (transient ischemic attack)    Compliant with asa 81mg , lipitor 40mg . No falls, bleeding. Continue regimen.         Other   Depression, major, single episode, mild ([place])    Controlled. Continue trazodone 25mg           I am having Natalie [name] maintain her Calcium Citrate-Vitamin D, cholecalciferol, fluticasone, aspirin EC, Tiotropium Bromide Monohydrate, traZODone, lisinopril, atorvastatin, metoprolol tartrate, and amLODipine.   No orders of the defined types were placed in this encounter.   Return precautions given.   Risks, benefits, and alternatives of the medications and treatment plan prescribed today were discussed, and patient expressed understanding.   Education regarding symptom management and diagnosis given to patient on AVS.  Continue to follow with  Natalie Hawthorne, FNP for routine health maintenance.   Natalie [name] and I agreed with plan.   Natalie Paris, FNP

## 2020-06-08 NOTE — Assessment & Plan Note (Signed)
Compliant with asa 81mg , lipitor 40mg . No falls, bleeding. Continue regimen.

## 2020-06-08 NOTE — Assessment & Plan Note (Signed)
Controlled. Continue trazodone 25mg 

## 2020-06-08 NOTE — Patient Instructions (Signed)
Nice to see you!   

## 2020-06-26 ENCOUNTER — Other Ambulatory Visit: Payer: Self-pay | Admitting: Family

## 2020-07-15 ENCOUNTER — Other Ambulatory Visit: Payer: Self-pay

## 2020-07-15 ENCOUNTER — Ambulatory Visit (INDEPENDENT_AMBULATORY_CARE_PROVIDER_SITE_OTHER): Payer: Medicare Other

## 2020-07-15 ENCOUNTER — Ambulatory Visit (INDEPENDENT_AMBULATORY_CARE_PROVIDER_SITE_OTHER): Payer: Medicare Other | Admitting: Vascular Surgery

## 2020-07-15 ENCOUNTER — Encounter (INDEPENDENT_AMBULATORY_CARE_PROVIDER_SITE_OTHER): Payer: Self-pay | Admitting: Vascular Surgery

## 2020-07-15 VITALS — BP 168/76 | HR 84 | Ht 65.0 in | Wt 132.0 lb

## 2020-07-15 DIAGNOSIS — I739 Peripheral vascular disease, unspecified: Secondary | ICD-10-CM

## 2020-07-15 DIAGNOSIS — I6523 Occlusion and stenosis of bilateral carotid arteries: Secondary | ICD-10-CM

## 2020-07-15 DIAGNOSIS — I10 Essential (primary) hypertension: Secondary | ICD-10-CM | POA: Diagnosis not present

## 2020-07-15 DIAGNOSIS — E785 Hyperlipidemia, unspecified: Secondary | ICD-10-CM

## 2020-07-15 DIAGNOSIS — I6522 Occlusion and stenosis of left carotid artery: Secondary | ICD-10-CM | POA: Diagnosis not present

## 2020-07-15 DIAGNOSIS — J449 Chronic obstructive pulmonary disease, unspecified: Secondary | ICD-10-CM

## 2020-07-15 NOTE — Progress Notes (Signed)
MRN : [id number]  Natalie [name] is a 87 y.o. (1929/07/14) female who presents with chief complaint of  Chief Complaint  Patient presents with  . Follow-up  . Carotid    1 yr U/S   .  History of Present Illness:   The patient is seen for follow up evaluation of carotid stenosis status post Leftcarotid endarterectomy with Primary Closureon 02/28/2019. Surgery was complicated with a postoperative hematoma and she was returned to the OR for evacuation.  There were no other post operative problems or complications related to the surgery after the hematoma evacuation.  The patient denies neck or incisional pain.  The patient denies interval amaurosis fugax. There is no recent history of TIA symptoms or focal motor deficits. There is no prior documented CVA.  Previous right ankle wound is completely healed.  She denies claudication or rest pain symptoms.  The patient is taking enteric-coated aspirin 81 mg daily.  The patient has a history of coronary artery disease, no recent episodes of angina or shortness of breath. There is a history of hyperlipidemia which is being treated with a statin.   Carotid Duplex ultrasound done today shows RICA < 24% and LICA widely patent s/p CEA (<30%)  Current Meds  Medication Sig  . amLODipine (NORVASC) 10 MG tablet TAKE 1 TABLET BY MOUTH EVERY DAY  . aspirin EC 81 MG tablet Take 81 mg by mouth daily. In preparation for surgery 12/18/ After stopping plavix  . atorvastatin (LIPITOR) 40 MG tablet TAKE 1 TABLET BY MOUTH EVERY DAY  . Calcium Citrate-Vitamin D 250-200 MG-UNIT TABS Take 250 mg by mouth every morning.  . cholecalciferol (VITAMIN D3) 25 MCG (1000 UT) tablet Take 1,000 Units by mouth daily.   . fluticasone (FLONASE) 50 MCG/ACT nasal spray Place 2 sprays into both nostrils daily.  Marland Kitchen lisinopril (ZESTRIL) 5 MG tablet TAKE ONE TABLET BY MOUTH DAILY.  . metoprolol tartrate (LOPRESSOR) 50 MG tablet TAKE 0.5 TABLETS (25 MG TOTAL) BY  MOUTH 2 (TWO) TIMES DAILY.  Marland Kitchen Tiotropium Bromide Monohydrate 2.5 MCG/ACT AERS Inhale 2 puffs into the lungs daily.  . traZODone (DESYREL) 50 MG tablet TAKE 0.5 TABLETS (25 MG TOTAL) BY MOUTH AT BEDTIME.    Past Medical History:  Diagnosis Date  . Allergies    hay fever  . Arthritis   . Breast cancer, left ([place]) 2015   Left total mastectomy  . COPD (chronic obstructive pulmonary disease) ([place])   . GERD (gastroesophageal reflux disease)   . Hyperlipemia   . Hypertension   . Stroke [facility]) 2002   TIA    Past Surgical History:  Procedure Laterality Date  . BREAST BIOPSY  2014  . BREAST LUMPECTOMY  2014   left side  . ENDARTERECTOMY Left 02/28/2019   Procedure: ENDARTERECTOMY CAROTID;  Surgeon: Katha Cabal, MD;  Location: [facility] ORS;  Service: Vascular;  Laterality: Left;  . EVACUATION OF CERVICAL HEMATOMA Left 02/28/2019   Procedure: EVACUATION OF HEMATOMA;  Surgeon: Katha Cabal, MD;  Location: [facility] ORS;  Service: Vascular;  Laterality: Left;  . EYE SURGERY Bilateral    cataract extractions  . JOINT REPLACEMENT    . MASTECTOMY Left May 9th 2017  . RHINOPLASTY  1956  . TONSILLECTOMY  1939  . TOTAL HIP ARTHROPLASTY Right    head of the femur replaced due to fracture    Social History Social History   Tobacco Use  . Smoking status: Former Research scientist (life sciences)  . Smokeless tobacco: Never Used  .  Tobacco comment: 20 years ago  Vaping Use  . Vaping Use: Never used  Substance Use Topics  . Alcohol use: Yes    Comment: occassionally  . Drug use: Never    Family History Family History  Problem Relation Age of Onset  . Breast cancer Mother   . Lung cancer Father     Allergies  Allergen Reactions  . Influenza Vaccines Shortness Of Breath     REVIEW OF SYSTEMS (Negative unless checked)  Constitutional: [] Weight loss  [] Fever  [] Chills Cardiac: [] Chest pain   [] Chest pressure   [] Palpitations   [] Shortness of breath when laying flat   [] Shortness of breath with  exertion. Vascular:  [] Pain in legs with walking   [] Pain in legs at rest  [] History of DVT   [] Phlebitis   [] Swelling in legs   [] Varicose veins   [] Non-healing ulcers Pulmonary:   [] Uses home oxygen   [] Productive cough   [] Hemoptysis   [] Wheeze  [] COPD   [] Asthma Neurologic:  [] Dizziness   [] Seizures   [] History of stroke   [x] History of TIA  [] Aphasia   [] Vissual changes   [] Weakness or numbness in arm   [] Weakness or numbness in leg Musculoskeletal:   [] Joint swelling   [] Joint pain   [] Low back pain Hematologic:  [] Easy bruising  [] Easy bleeding   [] Hypercoagulable state   [] Anemic Gastrointestinal:  [] Diarrhea   [] Vomiting  [] Gastroesophageal reflux/heartburn   [] Difficulty swallowing. Genitourinary:  [] Chronic kidney disease   [] Difficult urination  [] Frequent urination   [] Blood in urine Skin:  [] Rashes   [] Ulcers  Psychological:  [] History of anxiety   []  History of major depression.  Physical Examination  Vitals:   07/15/20 0954  BP: (!) 168/76  Pulse: 84  Weight: 132 lb (59.9 kg)  Height: 5\' 5"  (1.651 m)   Body mass index is 21.97 kg/m. Gen: WD/WN, NAD Head: [place]/AT, No temporalis wasting.  Ear/Nose/Throat: Hard of hearing, nares w/o erythema or drainage Eyes: PER, EOMI, sclera nonicteric.  Neck: Supple, no large masses.   Pulmonary:  Good air movement, no audible wheezing bilaterally, no use of accessory muscles.  Cardiac: RRR, no JVD Vascular:  Left neck incisional scar well healed no bruit Vessel Right Left  Radial Palpable Palpable  Brachial Palpable Palpable  Carotid Palpable Palpable  Gastrointestinal: Non-distended. No guarding/no peritoneal signs.  Musculoskeletal: M/S 5/5 throughout.  No deformity or atrophy.  Neurologic: CN 2-12 intact. Symmetrical.  Speech is fluent. Motor exam as listed above. Psychiatric: Judgment intact, Mood & affect appropriate for pt's clinical situation. Dermatologic: No rashes or ulcers noted.  No changes consistent with  cellulitis.  CBC Lab Results  Component Value Date   WBC 6.9 05/25/2020   HGB 12.4 05/25/2020   HCT 36.6 05/25/2020   MCV 94.0 05/25/2020   PLT 171.0 05/25/2020    BMET    Component Value Date/Time   NA 137 03/26/2020 0921   K 4.1 03/26/2020 0921   CL 101 03/26/2020 0921   CO2 28 03/26/2020 0921   GLUCOSE 139 (H) 03/26/2020 0921   BUN 21 03/26/2020 0921   CREATININE 1.12 03/26/2020 0921   CALCIUM 10.0 03/26/2020 0921   GFRNONAA >60 03/03/2019 0224   GFRAA >60 03/03/2019 0224   CrCl cannot be calculated (Patient's most recent lab result is older than the maximum 21 days allowed.).  COAG Lab Results  Component Value Date   INR 1.0 02/25/2019    Radiology No results found.   Assessment/Plan 1. Bilateral carotid  artery stenosis Recommend:  Given the patient's asymptomatic subcritical stenosis no further invasive testing or surgery at this time.  Duplex ultrasound shows <30% stenosis bilaterally.  Continue antiplatelet therapy as prescribed Continue management of CAD, HTN and Hyperlipidemia Healthy heart diet,  encouraged exercise at least 4 times per week Follow up in 12 months with duplex ultrasound and physical exam   - VAS US CAROTID; Future  2. Peripheral vascular disease ([place]) Recommend:  I do not find evidence of life style limiting vascular disease. The patient specifically denies life style limitation.  Previous noninvasive studies including ABI's of the legs do not identify critical vascular problems.  The patient should continue walking and begin a more formal exercise program. The patient should continue his antiplatelet therapy and aggressive treatment of the lipid abnormalities.  3. Essential hypertension Continue antihypertensive medications as already ordered, these medications have been reviewed and there are no changes at this time.   4. Chronic obstructive pulmonary disease, unspecified COPD type ([place]) Continue pulmonary medications  and aerosols as already ordered, these medications have been reviewed and there are no changes at this time.    5. Hyperlipidemia, unspecified hyperlipidemia type Continue statin as ordered and reviewed, no changes at this time    [name], MD  07/15/2020 10:00 AM

## 2020-07-20 ENCOUNTER — Other Ambulatory Visit: Payer: Medicare Other

## 2020-07-21 ENCOUNTER — Other Ambulatory Visit: Payer: Medicare Other

## 2020-07-28 ENCOUNTER — Other Ambulatory Visit: Payer: Self-pay

## 2020-07-28 ENCOUNTER — Other Ambulatory Visit (INDEPENDENT_AMBULATORY_CARE_PROVIDER_SITE_OTHER): Payer: Medicare Other

## 2020-07-28 DIAGNOSIS — I10 Essential (primary) hypertension: Secondary | ICD-10-CM | POA: Diagnosis not present

## 2020-07-28 LAB — COMPREHENSIVE METABOLIC PANEL
ALT: 16 U/L (ref 0–35)
AST: 19 U/L (ref 0–37)
Albumin: 4.3 g/dL (ref 3.5–5.2)
Alkaline Phosphatase: 55 U/L (ref 39–117)
BUN: 23 mg/dL (ref 6–23)
CO2: 27 mEq/L (ref 19–32)
Calcium: 10.3 mg/dL (ref 8.4–10.5)
Chloride: 101 mEq/L (ref 96–112)
Creatinine, Ser: 1.27 mg/dL — ABNORMAL HIGH (ref 0.40–1.20)
GFR: 37.64 mL/min — ABNORMAL LOW (ref >60.00)
Glucose, Bld: 135 mg/dL — ABNORMAL HIGH (ref 70–99)
Potassium: 4.2 mEq/L (ref 3.5–5.1)
Sodium: 138 mEq/L (ref 135–145)
Total Bilirubin: 0.7 mg/dL (ref 0.2–1.2)
Total Protein: 6.9 g/dL (ref 6.0–8.3)

## 2020-08-02 ENCOUNTER — Telehealth: Payer: Self-pay

## 2020-08-02 NOTE — Telephone Encounter (Signed)
LMTCB for labs. 

## 2020-08-05 ENCOUNTER — Other Ambulatory Visit: Payer: Self-pay | Admitting: Family

## 2020-08-05 DIAGNOSIS — I10 Essential (primary) hypertension: Secondary | ICD-10-CM

## 2020-08-12 ENCOUNTER — Other Ambulatory Visit: Payer: Self-pay

## 2020-08-12 DIAGNOSIS — I10 Essential (primary) hypertension: Secondary | ICD-10-CM

## 2020-08-16 ENCOUNTER — Other Ambulatory Visit: Payer: Self-pay

## 2020-08-16 ENCOUNTER — Other Ambulatory Visit (INDEPENDENT_AMBULATORY_CARE_PROVIDER_SITE_OTHER): Payer: Medicare Other

## 2020-08-16 DIAGNOSIS — I10 Essential (primary) hypertension: Secondary | ICD-10-CM | POA: Diagnosis not present

## 2020-08-17 LAB — BASIC METABOLIC PANEL
BUN: 18 mg/dL (ref 6–23)
CO2: 27 mEq/L (ref 19–32)
Calcium: 9.7 mg/dL (ref 8.4–10.5)
Chloride: 99 mEq/L (ref 96–112)
Creatinine, Ser: 1.25 mg/dL — ABNORMAL HIGH (ref 0.40–1.20)
GFR: 38.35 mL/min — ABNORMAL LOW (ref >60.00)
Glucose, Bld: 161 mg/dL — ABNORMAL HIGH (ref 70–99)
Potassium: 3.9 mEq/L (ref 3.5–5.1)
Sodium: 137 mEq/L (ref 135–145)

## 2020-08-18 ENCOUNTER — Ambulatory Visit: Payer: Medicare Other | Admitting: Dermatology

## 2020-08-20 ENCOUNTER — Telehealth: Payer: Self-pay

## 2020-08-20 ENCOUNTER — Other Ambulatory Visit: Payer: Self-pay | Admitting: Family

## 2020-08-20 DIAGNOSIS — N189 Chronic kidney disease, unspecified: Secondary | ICD-10-CM

## 2020-08-20 DIAGNOSIS — F320 Major depressive disorder, single episode, mild: Secondary | ICD-10-CM

## 2020-08-20 DIAGNOSIS — Z0000 Encounter for general adult medical examination without abnormal findings: Secondary | ICD-10-CM

## 2020-08-20 NOTE — Telephone Encounter (Signed)
LMTCB for labs. 

## 2020-09-08 ENCOUNTER — Ambulatory Visit: Payer: Medicare Other | Admitting: Family

## 2020-09-11 ENCOUNTER — Other Ambulatory Visit: Payer: Self-pay | Admitting: Family

## 2020-10-03 ENCOUNTER — Other Ambulatory Visit: Payer: Self-pay | Admitting: Family

## 2020-10-07 ENCOUNTER — Other Ambulatory Visit: Payer: Self-pay | Admitting: Nephrology

## 2020-10-07 DIAGNOSIS — N1832 Chronic kidney disease, stage 3b: Secondary | ICD-10-CM

## 2020-10-07 DIAGNOSIS — D631 Anemia in chronic kidney disease: Secondary | ICD-10-CM

## 2020-10-07 DIAGNOSIS — R8290 Unspecified abnormal findings in urine: Secondary | ICD-10-CM

## 2020-10-07 DIAGNOSIS — I10 Essential (primary) hypertension: Secondary | ICD-10-CM | POA: Diagnosis not present

## 2020-10-12 ENCOUNTER — Other Ambulatory Visit: Payer: Self-pay

## 2020-10-12 ENCOUNTER — Encounter: Payer: Self-pay | Admitting: Family

## 2020-10-12 ENCOUNTER — Ambulatory Visit (INDEPENDENT_AMBULATORY_CARE_PROVIDER_SITE_OTHER): Payer: Medicare Other | Admitting: Family

## 2020-10-12 DIAGNOSIS — I6522 Occlusion and stenosis of left carotid artery: Secondary | ICD-10-CM

## 2020-10-12 DIAGNOSIS — I10 Essential (primary) hypertension: Secondary | ICD-10-CM

## 2020-10-12 DIAGNOSIS — J449 Chronic obstructive pulmonary disease, unspecified: Secondary | ICD-10-CM

## 2020-10-12 DIAGNOSIS — N189 Chronic kidney disease, unspecified: Secondary | ICD-10-CM | POA: Insufficient documentation

## 2020-10-12 MED ORDER — TIOTROPIUM BROMIDE MONOHYDRATE 2.5 MCG/ACT IN AERS: 2.0000 | INHALATION_SPRAY | Freq: Every day | RESPIRATORY_TRACT | 3 refills | Status: DC

## 2020-10-12 NOTE — Progress Notes (Signed)
Subjective:    Patient ID: Natalie [name], female    DOB: 09/16/1929, 87 y.o.   MRN: [id number]  CC: Natalie [name] is a 87 y.o. female who presents today for follow up.   HPI: Feels well  No complaints today  HTN-compliant with lisinopril 5 mg, Lopressor 50 mg and amlodipine 10 mg. No cp, sob. At home  BP ranges from 140-150/60-70.   Consult with Dr Lanora Manis, nephrology, CKD likely secondary to hypertensive nephrosclerosis.Follow up in 2 months, ordered renal US. Crt 1.22, 10/07/20   She is not taking NSAIDs  She continues to follow with vascular, Dr. Delana Meyer, last appointment 07/15/2020 for bilateral carotid artery stenosis and peripheral vascular disease.  Continue annual follow-up.    She remains on aspirin 81 mg, Lipitor 40 mg.    HISTORY:  Past Medical History:  Diagnosis Date   Allergies    hay fever   Arthritis    Breast cancer, left ([place]) 2015   Left total mastectomy   COPD (chronic obstructive pulmonary disease) ([place])    GERD (gastroesophageal reflux disease)    Hyperlipemia    Hypertension    Stroke ([place]) 2002   TIA   Past Surgical History:  Procedure Laterality Date   BREAST BIOPSY  2014   BREAST LUMPECTOMY  2014   left side   ENDARTERECTOMY Left 02/28/2019   Procedure: ENDARTERECTOMY CAROTID;  Surgeon: Katha Cabal, MD;  Location: [facility] ORS;  Service: Vascular;  Laterality: Left;   EVACUATION OF CERVICAL HEMATOMA Left 02/28/2019   Procedure: EVACUATION OF HEMATOMA;  Surgeon: Katha Cabal, MD;  Location: [facility] ORS;  Service: Vascular;  Laterality: Left;   EYE SURGERY Bilateral    cataract extractions   JOINT REPLACEMENT     MASTECTOMY Left May 9th 2017   [place]   TOTAL HIP ARTHROPLASTY Right    head of the femur replaced due to fracture   Family History  Problem Relation Age of Onset   Breast cancer Mother    Lung cancer Father     Allergies: Influenza vaccines Current Outpatient Medications on  File Prior to Visit  Medication Sig Dispense Refill   amLODipine (NORVASC) 10 MG tablet TAKE 1 TABLET BY MOUTH EVERY DAY 90 tablet 0   aspirin EC 81 MG tablet Take 81 mg by mouth daily. In preparation for surgery 12/18/ After stopping plavix     atorvastatin (LIPITOR) 40 MG tablet TAKE 1 TABLET BY MOUTH EVERY DAY 90 tablet 3   Calcium Citrate-Vitamin D 250-200 MG-UNIT TABS Take 250 mg by mouth every morning.     cholecalciferol (VITAMIN D3) 25 MCG (1000 UT) tablet Take 1,000 Units by mouth daily.      fluticasone (FLONASE) 50 MCG/ACT nasal spray Place 2 sprays into both nostrils daily.     lisinopril (ZESTRIL) 5 MG tablet TAKE 1 TABLET BY MOUTH DAILY 90 tablet 0   metoprolol tartrate (LOPRESSOR) 50 MG tablet TAKE 0.5 TABLETS BY MOUTH 2 TIMES DAILY. 90 tablet 0   traZODone (DESYREL) 50 MG tablet TAKE 0.5 TABLETS (25 MG TOTAL) BY MOUTH AT BEDTIME. 45 tablet 3   No current facility-administered medications on file prior to visit.    Social History   Tobacco Use   Smoking status: Former   Smokeless tobacco: Never   Tobacco comments:    20 years ago  Vaping Use   Vaping Use: Never used  Substance Use Topics   Alcohol use: Yes  Comment: occassionally   Drug use: Never    Review of Systems  Constitutional:  Negative for chills and fever.  Respiratory:  Negative for cough.   Cardiovascular:  Negative for chest pain and palpitations.  Gastrointestinal:  Negative for nausea and vomiting.     Objective:    BP (!) 142/58 (BP Location: Left Arm, Patient Position: Sitting, Cuff Size: Normal)   Pulse 68   Temp 98.3 F (36.8 C) (Oral)   Ht '5\' 5"'$  (1.651 m)   Wt 133 lb 3.2 oz (60.4 kg)   SpO2 96%   BMI 22.17 kg/m  BP Readings from Last 3 Encounters:  10/12/20 (!) 142/58  07/15/20 (!) 168/76  06/08/20 (!) 146/50   Wt Readings from Last 3 Encounters:  10/12/20 133 lb 3.2 oz (60.4 kg)  07/15/20 132 lb (59.9 kg)  06/08/20 133 lb 12.8 oz (60.7 kg)    Physical Exam Vitals  reviewed.  Constitutional:      Appearance: She is well-developed.  Eyes:     Conjunctiva/sclera: Conjunctivae normal.  Cardiovascular:     Rate and Rhythm: Normal rate and regular rhythm.     Pulses: Normal pulses.     Heart sounds: Normal heart sounds.  Pulmonary:     Effort: Pulmonary effort is normal.     Breath sounds: Normal breath sounds. No wheezing, rhonchi or rales.  Skin:    General: Skin is warm and dry.  Neurological:     Mental Status: She is alert.  Psychiatric:        Speech: Speech normal.        Behavior: Behavior normal.        Thought Content: Thought content normal.       Assessment & Plan:   Problem List Items Addressed This Visit       Cardiovascular and Mediastinum   Carotid atherosclerosis    Chronic, stable. Continue aspirin 81 mg, Lipitor 40 mg. Following annually with Dr Ronalee Belts.        Essential hypertension    Chronic, stable. DBP on low end. Goal < 140/80. Continue lisinopril 5 mg, Lopressor 50 mg and amlodipine 10 mg.         Genitourinary   CKD (chronic kidney disease)     I am having Joaquim Lai A. Grieder maintain her Calcium Citrate-Vitamin D, cholecalciferol, fluticasone, aspirin EC, atorvastatin, metoprolol tartrate, traZODone, amLODipine, and lisinopril.   No orders of the defined types were placed in this encounter.   Return precautions given.   Risks, benefits, and alternatives of the medications and treatment plan prescribed today were discussed, and patient expressed understanding.   Education regarding symptom management and diagnosis given to patient on AVS.  Continue to follow with [name], FNP for routine health maintenance.   Natalie [name] and I agreed with plan.   Mable Paris, FNP

## 2020-10-12 NOTE — Assessment & Plan Note (Addendum)
Chronic, stable. Continue aspirin 81 mg, Lipitor 40 mg. Following annually with Dr Ronalee Belts.

## 2020-10-12 NOTE — Assessment & Plan Note (Signed)
Chronic, stable. DBP on low end. Goal < 140/80. Continue lisinopril 5 mg, Lopressor 50 mg and amlodipine 10 mg.

## 2020-10-12 NOTE — Patient Instructions (Signed)
Always so nice to see you.  Please continue medications as prescribed.  Please see medication sheet which we have printed today.

## 2020-10-26 ENCOUNTER — Ambulatory Visit: Payer: Medicare Other

## 2020-11-11 ENCOUNTER — Other Ambulatory Visit: Payer: Self-pay | Admitting: Family

## 2020-11-11 DIAGNOSIS — I10 Essential (primary) hypertension: Secondary | ICD-10-CM

## 2020-11-16 ENCOUNTER — Other Ambulatory Visit: Payer: Self-pay

## 2020-11-16 ENCOUNTER — Encounter: Payer: Self-pay | Admitting: Emergency Medicine

## 2020-11-16 ENCOUNTER — Emergency Department: Payer: Medicare Other

## 2020-11-16 ENCOUNTER — Telehealth: Payer: Self-pay

## 2020-11-16 ENCOUNTER — Emergency Department
Admission: EM | Admit: 2020-11-16 | Discharge: 2020-11-16 | Disposition: A | Discharge status: Home / Self Care | Payer: Medicare Other | Attending: Emergency Medicine | Admitting: Emergency Medicine

## 2020-11-16 DIAGNOSIS — I129 Hypertensive chronic kidney disease with stage 1 through stage 4 chronic kidney disease, or unspecified chronic kidney disease: Secondary | ICD-10-CM | POA: Diagnosis not present

## 2020-11-16 DIAGNOSIS — R519 Headache, unspecified: Secondary | ICD-10-CM | POA: Diagnosis not present

## 2020-11-16 DIAGNOSIS — G319 Degenerative disease of nervous system, unspecified: Secondary | ICD-10-CM | POA: Diagnosis not present

## 2020-11-16 DIAGNOSIS — Z853 Personal history of malignant neoplasm of breast: Secondary | ICD-10-CM | POA: Insufficient documentation

## 2020-11-16 DIAGNOSIS — Z7951 Long term (current) use of inhaled steroids: Secondary | ICD-10-CM | POA: Insufficient documentation

## 2020-11-16 DIAGNOSIS — Z79899 Other long term (current) drug therapy: Secondary | ICD-10-CM | POA: Insufficient documentation

## 2020-11-16 DIAGNOSIS — N189 Chronic kidney disease, unspecified: Secondary | ICD-10-CM | POA: Diagnosis not present

## 2020-11-16 DIAGNOSIS — G9389 Other specified disorders of brain: Secondary | ICD-10-CM | POA: Diagnosis not present

## 2020-11-16 DIAGNOSIS — J449 Chronic obstructive pulmonary disease, unspecified: Secondary | ICD-10-CM | POA: Insufficient documentation

## 2020-11-16 DIAGNOSIS — Z7982 Long term (current) use of aspirin: Secondary | ICD-10-CM | POA: Insufficient documentation

## 2020-11-16 DIAGNOSIS — Z96641 Presence of right artificial hip joint: Secondary | ICD-10-CM | POA: Diagnosis not present

## 2020-11-16 DIAGNOSIS — R42 Dizziness and giddiness: Secondary | ICD-10-CM | POA: Diagnosis not present

## 2020-11-16 DIAGNOSIS — Z87891 Personal history of nicotine dependence: Secondary | ICD-10-CM | POA: Diagnosis not present

## 2020-11-16 LAB — URINALYSIS, COMPLETE (UACMP) WITH MICROSCOPIC
Bacteria, UA: NONE SEEN
Bilirubin Urine: NEGATIVE
Glucose, UA: NEGATIVE mg/dL
Ketones, ur: NEGATIVE mg/dL
Nitrite: NEGATIVE
Protein, ur: NEGATIVE mg/dL
Specific Gravity, Urine: 1.02 (ref 1.005–1.030)
pH: 5.5 (ref 5.0–8.0)

## 2020-11-16 LAB — BASIC METABOLIC PANEL
Anion gap: 9 (ref 5–15)
BUN: 25 mg/dL — ABNORMAL HIGH (ref 8–23)
CO2: 25 mmol/L (ref 22–32)
Calcium: 9.7 mg/dL (ref 8.9–10.3)
Chloride: 101 mmol/L (ref 98–111)
Creatinine, Ser: 1.11 mg/dL — ABNORMAL HIGH (ref 0.44–1.00)
GFR, Estimated: 48 mL/min — ABNORMAL LOW (ref >60)
Glucose, Bld: 133 mg/dL — ABNORMAL HIGH (ref 70–99)
Potassium: 4 mmol/L (ref 3.5–5.1)
Sodium: 135 mmol/L (ref 135–145)

## 2020-11-16 LAB — CBC
HCT: 36 % (ref 36.0–46.0)
Hemoglobin: 12.6 g/dL (ref 12.0–15.0)
MCH: 31.8 pg (ref 26.0–34.0)
MCHC: 35 g/dL (ref 30.0–36.0)
MCV: 90.9 fL (ref 80.0–100.0)
Platelets: 216 10*3/uL (ref 150–400)
RBC: 3.96 MIL/uL (ref 3.87–5.11)
RDW: 12.4 % (ref 11.5–15.5)
WBC: 9.2 10*3/uL (ref 4.0–10.5)
nRBC: 0 % (ref 0.0–0.2)

## 2020-11-16 IMAGING — MR MR HEAD W/O CM
13 series · 45 of 48 positions shown · non-contrast
Comparison: None.

CLINICAL DATA: Dizziness.  Headache.

EXAM:
MRI HEAD WITHOUT CONTRAST
TECHNIQUE: Multiplanar, multiecho pulse sequences of the brain and surrounding
structures were obtained without intravenous contrast.

[Series 5: ax dwi_tracew · axial · 3.0mm · 0.65mm/px · z∈[-91,+59]mm · 3 of 48 slices shown]
[im 1/48]
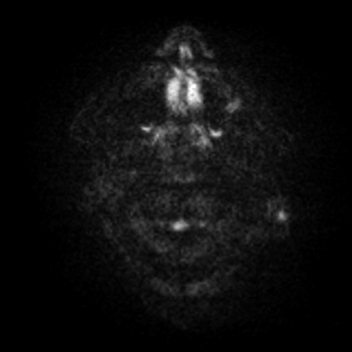
[im 24/48]
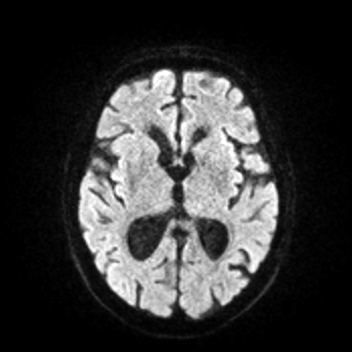
[im 48/48]
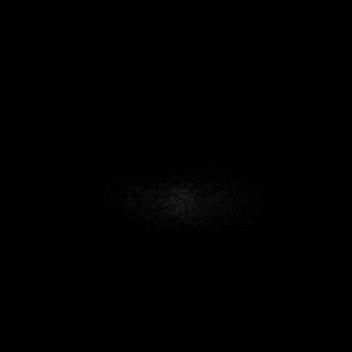

[Series 6: ax dwi_adc · axial · 3.0mm · 0.65mm/px · z∈[-91,+56]mm · 3 of 47 slices shown]
[im 1/47]
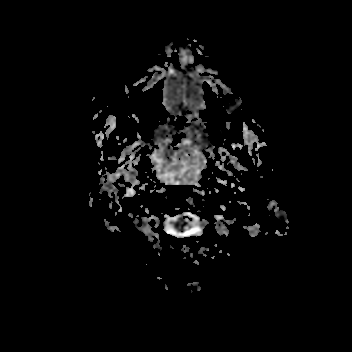
[im 24/47]
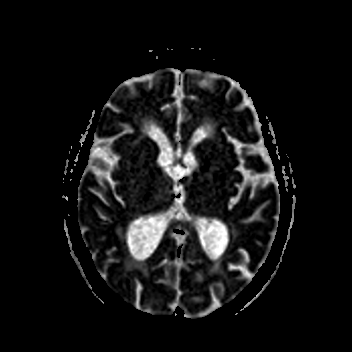
[im 47/47]
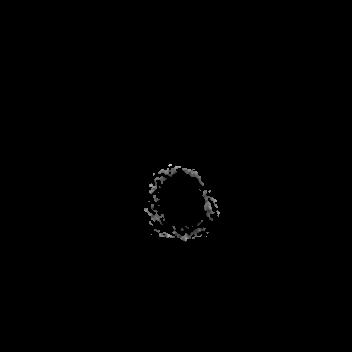

[Series 7: cor dwi_tracew · coronal · 5.0mm · 0.65mm/px · 3 of 38 slices shown]
[im 1/38]
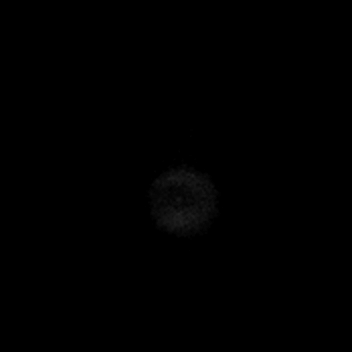
[im 19/38]
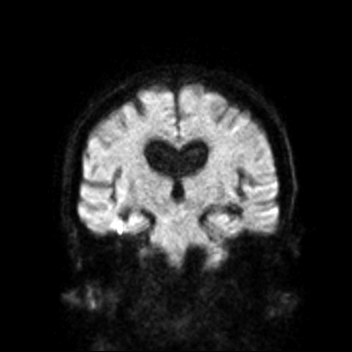
[im 38/38]
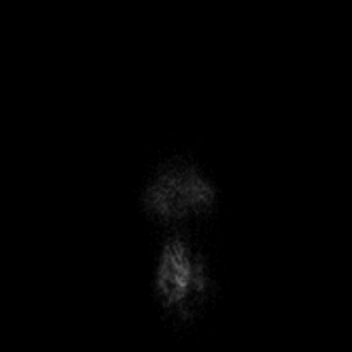

[Series 8: cor dwi_adc · coronal · 5.0mm · 0.65mm/px · 3 of 37 slices shown]
[im 1/37]
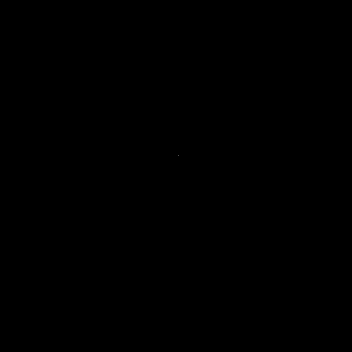
[im 19/37]
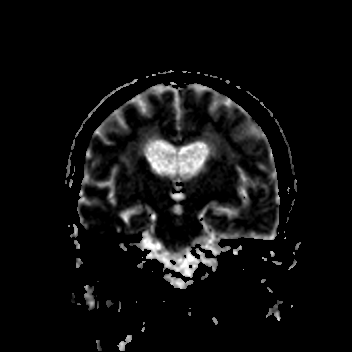
[im 37/37]
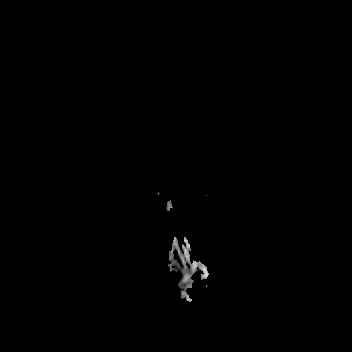

[Series 9: T1 · sagittal · 5.0mm · 0.62mm/px · 2 of 23 slices shown (1 of 2)]
[im 1/23]
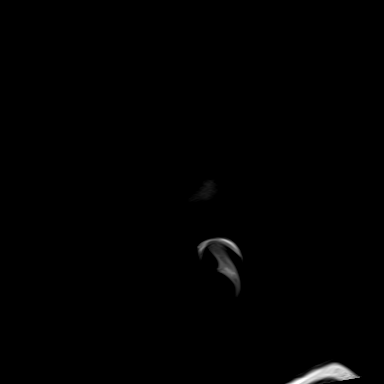
[im 23/23]
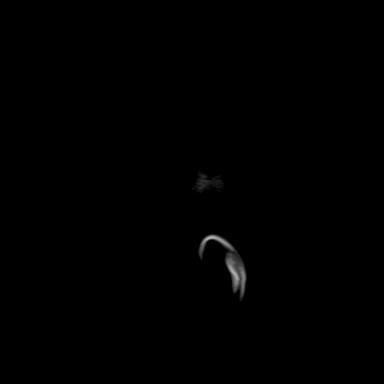

[Series 10: T2 · axial · 5.0mm · 0.53mm/px · z∈[-87,+52]mm · 2 of 25 slices shown (1 of 3)]
[im 1/25]
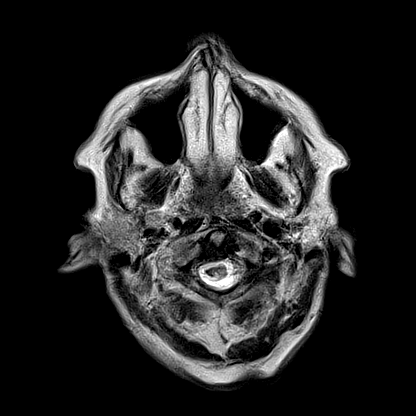
[im 25/25]
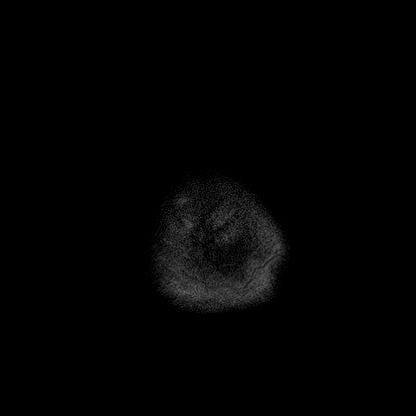

[Series 11: mag_images · axial · 3.0mm · 0.90mm/px · z∈[-102,+69]mm · 4 of 60 slices shown]
[im 1/60]
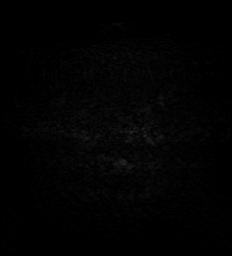
[im 20/60]
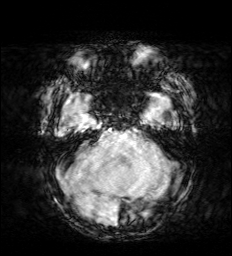
[im 40/60]
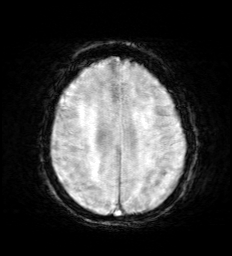
[im 60/60]
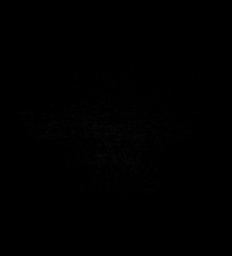

[Series 12: pha_images · axial · 3.0mm · 0.90mm/px · z∈[-87,+49]mm · 3 of 48 slices shown]
[im 1/48]
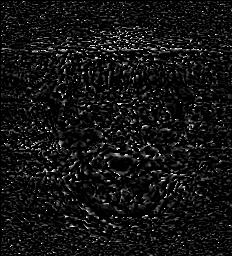
[im 24/48]
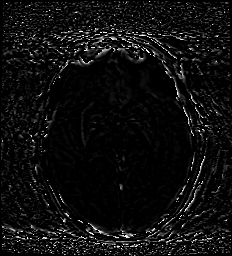
[im 48/48]
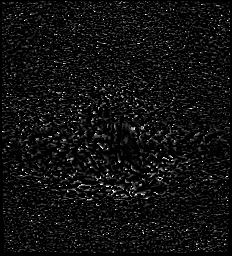

[Series 13: swi_images · axial · 3.0mm · 0.90mm/px · z∈[-102,+69]mm · 4 of 60 slices shown]
[im 1/60]
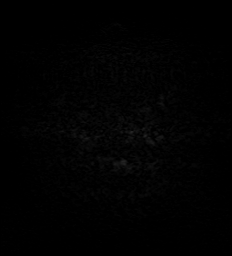
[im 20/60]
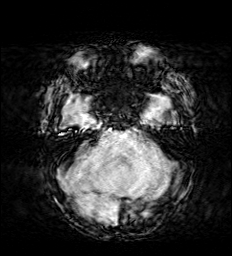
[im 40/60]
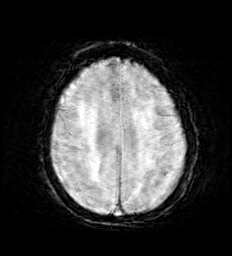
[im 60/60]
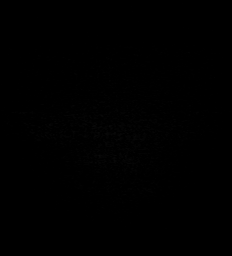

[Series 15: FLAIR · axial · 3.0mm · 0.53mm/px · z∈[-96,+61]mm · 4 of 55 slices shown]
[im 1/55]
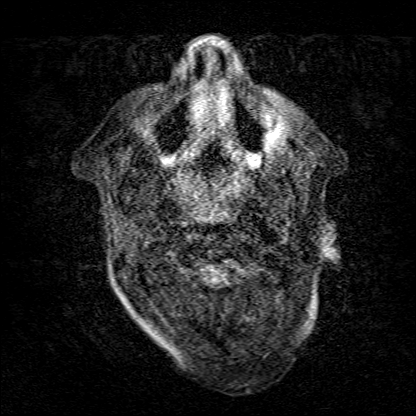
[im 19/55]
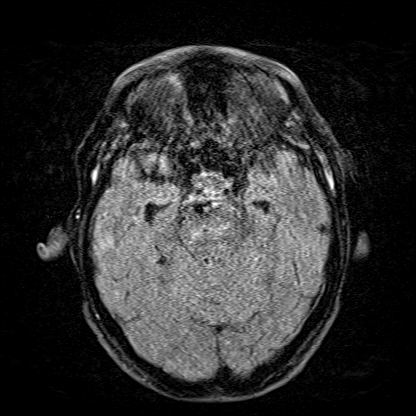
[im 37/55]
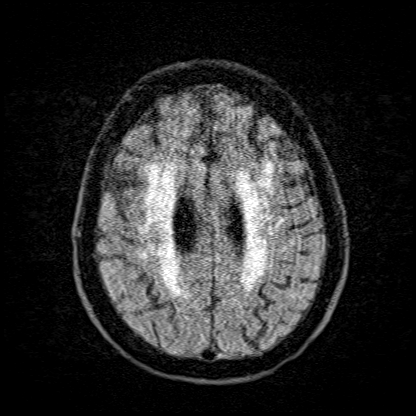
[im 55/55]
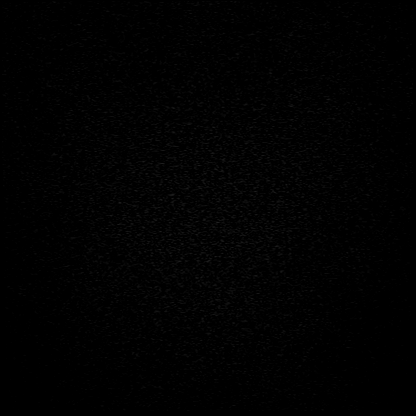

[Series 16: T2 · axial · 5.0mm · 0.45mm/px · z∈[-92,+59]mm · 2 of 27 slices shown (2 of 3)]
[im 1/27]
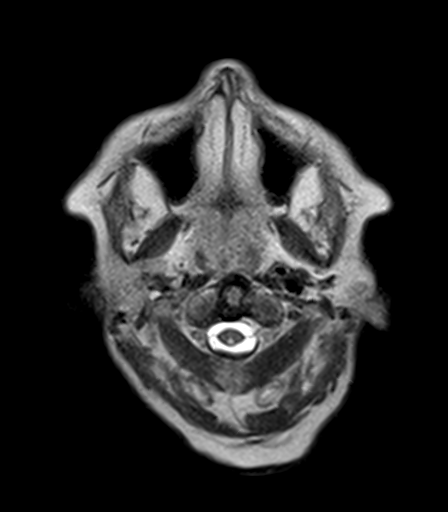
[im 27/27]
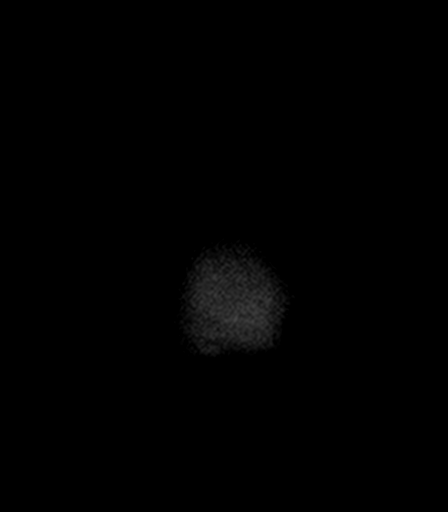

[Series 17: T1 · axial · 1.0mm · 0.98mm/px · z∈[-114,+70]mm · 10 of 192 slices shown (2 of 2)]
[im 1/192]
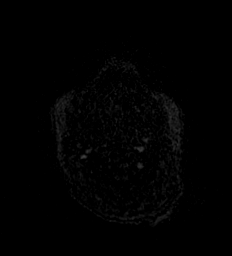
[im 16/192]
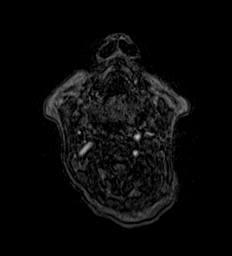
[im 32/192]
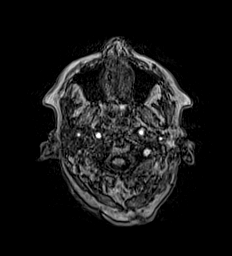
[im 48/192]
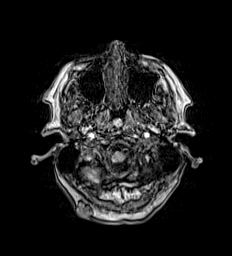
[im 64/192]
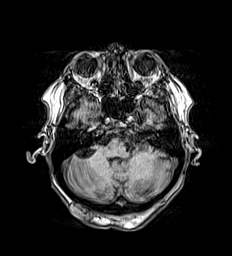
[im 80/192]
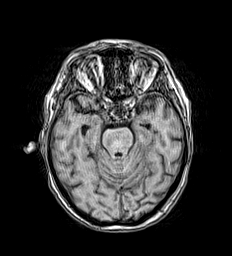
[im 112/192]
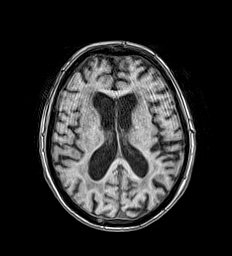
[im 128/192]
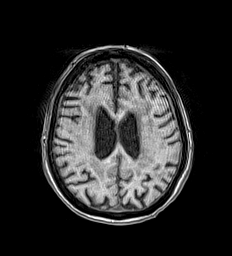
[im 160/192]
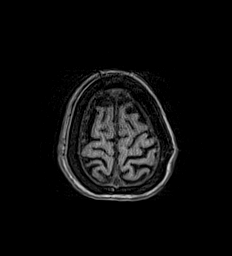
[im 192/192]
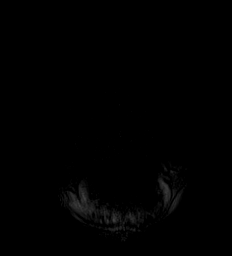

[Series 18: T2 · coronal · 5.0mm · 0.57mm/px · 2 of 29 slices shown (3 of 3)]
[im 1/29]
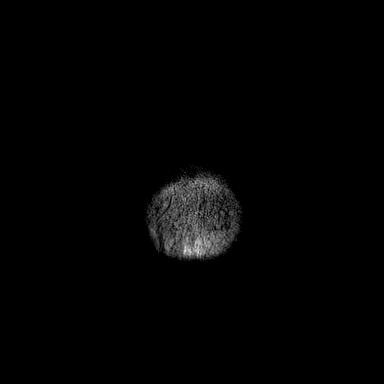
[im 29/29]
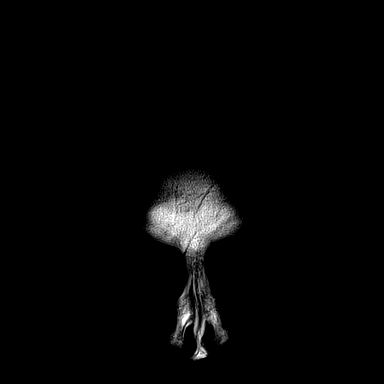

[45 of 48 positions shown; findings below may reference images not displayed]

FINDINGS: The study is moderately motion degraded.

Brain: There is no evidence of an acute infarct, intracranial
hemorrhage, mass, midline shift, or extra-axial fluid collection.
Confluent T2 hyperintensities in the cerebral white matter and
patchy T2 hyperintensities in the pons are nonspecific but
compatible with severe chronic small vessel ischemic disease. There
is moderate cerebral atrophy. No focal cerebellar insult is
identified.

Vascular: Major intracranial vascular flow voids are preserved.

Skull and upper cervical spine: Unremarkable bone marrow signal para

Sinuses/Orbits: Bilateral cataract extraction. Paranasal sinuses and
mastoid air cells are clear.

Other: Nonspecific 3 cm cutaneous/subcutaneous lesion in the right
occipital scalp.
IMPRESSION: 1. Motion degraded examination without evidence of an acute
intracranial abnormality.
2. Severe chronic small vessel ischemic disease.

## 2020-11-16 MED ORDER — SODIUM CHLORIDE 0.9 % IV BOLUS
500.0000 mL | Freq: Once | INTRAVENOUS | Status: AC
  Administered 2020-11-16: 500 mL via INTRAVENOUS

## 2020-11-16 NOTE — ED Provider Notes (Signed)
[facility] Emergency Department Provider Note ____________________________________________   Event Date/Time   First MD Initiated Contact with Patient 11/16/20 1755     (approximate)  I have reviewed the triage vital signs and the nursing notes.  HISTORY  Chief Complaint Dizziness  HPI Natalie [name] is a 87 y.o. female history of COPD hyperlipidemia hypertension prior TIA  Patient presents reports that since Friday she has been experiencing a feeling of "dizziness".  She reports that Friday she got up took her dog for a walk felt pretty well but just a little bit lightheaded after.  Then throughout the day she is continue to feel lightheaded and when she walks she just feels like she is off balance.  No trouble speaking no numbness or weakness in arm or leg.  No headache.  No chest pain no trouble breathing no fevers no chills no other illness  She is continue to have this persistent feeling, called her doctor's office today and they recommended she come to the ER for evaluation.  No pain or burning with urination no recent medication changes.  Otherwise in her normal health but she just feels like her balance is off  Past Medical History:  Diagnosis Date   Allergies    hay fever   Arthritis    Breast cancer, left ([place]) 2015   Left total mastectomy   COPD (chronic obstructive pulmonary disease) ([facility])    GERD (gastroesophageal reflux disease)    Hyperlipemia    Hypertension    Stroke ([place]) 2002   TIA    Patient Active Problem List   Diagnosis Date Noted   CKD (chronic kidney disease) 10/12/2020   Breast mass, right 10/21/2019   Wound of right ankle 07/17/2019   Dysuria 03/28/2019   Carotid artery stenosis 02/28/2019   Carotid stenosis, symptomatic w/o infarct, left 10/17/2018   Chest wall pain 09/20/2018   Encounter for medical examination to establish care 02/27/2018   Hearing loss 02/27/2018   Screening for osteoporosis 02/27/2018    Depression, major, single episode, mild ([place]) 02/27/2018   Essential hypertension 02/27/2018   Memory changes 02/27/2018   COPD (chronic obstructive pulmonary disease) ([place]) 02/27/2018   HLD (hyperlipidemia) 02/27/2018   History of TIA (transient ischemic attack) 02/27/2018   Prediabetes 11/17/2015   Seroma 12/18/2014   Personal history of malignant neoplasm of breast 08/22/2014   History of fracture of right hip 05/26/2014   Peripheral vascular disease ([place]) 05/26/2014   Depressive disorder, not elsewhere classified 07/03/2013   TIA (transient ischemic attack) 08/21/2012   Nail fungus 03/08/2012   At risk for falls 06/15/2011   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   Cancer of breast ([place]) 01/02/2011   Carotid atherosclerosis 01/02/2011   Chronic hoarseness 01/02/2011   Esophageal reflux 01/02/2011    Past Surgical History:  Procedure Laterality Date   BREAST BIOPSY  2014   BREAST LUMPECTOMY  2014   left side   ENDARTERECTOMY Left 02/28/2019   Procedure: ENDARTERECTOMY CAROTID;  Surgeon: Katha Cabal, MD;  Location: [facility] ORS;  Service: Vascular;  Laterality: Left;   EVACUATION OF CERVICAL HEMATOMA Left 02/28/2019   Procedure: EVACUATION OF HEMATOMA;  Surgeon: Katha Cabal, MD;  Location: [facility] ORS;  Service: Vascular;  Laterality: Left;   EYE SURGERY Bilateral    cataract extractions   JOINT REPLACEMENT     MASTECTOMY Left May 9th 2017   RHINOPLASTY  1956   TONSILLECTOMY  1939   TOTAL HIP ARTHROPLASTY Right  head of the femur replaced due to fracture    Prior to Admission medications   Medication Sig Start Date End Date Taking? Authorizing Provider  amLODipine (NORVASC) 10 MG tablet TAKE 1 TABLET BY MOUTH EVERY DAY 09/14/20   [name], FNP  aspirin EC 81 MG tablet Take 81 mg by mouth daily. In preparation for surgery 12/18/ After stopping plavix    [provider]  atorvastatin (LIPITOR) 40 MG tablet TAKE 1 TABLET BY MOUTH EVERY DAY 04/30/20    [name], FNP  Calcium Citrate-Vitamin D 250-200 MG-UNIT TABS Take 250 mg by mouth every morning.    [provider]  cholecalciferol (VITAMIN D3) 25 MCG (1000 UT) tablet Take 1,000 Units by mouth daily.     [provider]  fluticasone (FLONASE) 50 MCG/ACT nasal spray Place 2 sprays into both nostrils daily.    [provider]  lisinopril (ZESTRIL) 5 MG tablet TAKE 1 TABLET BY MOUTH DAILY 10/04/20   [name], FNP  metoprolol tartrate (LOPRESSOR) 50 MG tablet TAKE 1/2 TABLET BY MOUTH TWICE A DAY 11/11/20   [name], FNP  Tiotropium Bromide Monohydrate 2.5 MCG/ACT AERS Inhale 2 puffs into the lungs daily. 10/12/20   [name], FNP  traZODone (DESYREL) 50 MG tablet TAKE 0.5 TABLETS (25 MG TOTAL) BY MOUTH AT BEDTIME. 08/20/20   [name], FNP    Allergies Influenza vaccines  Family History  Problem Relation Age of Onset   Breast cancer Mother    Lung cancer Father     Social History Social History   Tobacco Use   Smoking status: Former   Smokeless tobacco: Never   Tobacco comments:    20 years ago  Vaping Use   Vaping Use: Never used  Substance Use Topics   Alcohol use: Yes    Comment: occassionally   Drug use: Never    Review of Systems Constitutional: No fever/chills Eyes: No visual changes. ENT: No neck pain Cardiovascular: Denies chest pain. Respiratory: Denies shortness of breath. Gastrointestinal: No abdominal pain.   Genitourinary: Negative for dysuria. Musculoskeletal: Negative for back pain. Skin: Negative for rash. Neurological: Negative for headaches, areas of focal weakness or numbness.  Does report occasionally will get headaches at the back of the scalp ports towards her occipitalis but reports has been ongoing since she was a child nothing new  She is stable to get around her home she has not had any falls, she uses a sort of hybrid  wheelchair/walker  ____________________________________________   PHYSICAL EXAM:  VITAL SIGNS: ED Triage Vitals [11/16/20 1556]  Enc Vitals Group     BP (!) 160/135     Pulse Rate 72     Resp 20     Temp 98.4 F (36.9 C)     Temp Source Oral     SpO2 95 %     Weight 133 lb (60.3 kg)     Height '5\' 7"'$  (1.702 m)     Head Circumference      Peak Flow      Pain Score 0     Pain Loc      Pain Edu?      Excl. in [place]?     Constitutional: Alert and oriented. Well appearing and in no acute distress.  She is very alert well-appearing and pleasant. Eyes: Conjunctivae are normal. Head: Atraumatic. Nose: No congestion/rhinnorhea. Mouth/Throat: Mucous membranes are moist. Neck: No stridor.  Cardiovascular: Normal rate, regular rhythm.  Grossly normal heart sounds.  Good peripheral circulation. Respiratory: Normal respiratory effort.  No retractions. Lungs CTAB. Gastrointestinal: Soft and nontender. No distention. Musculoskeletal: No lower extremity tenderness nor edema. Neurologic:  Normal speech and language. No gross focal neurologic deficits are appreciated.  No pronator drift.  Moves all extremities with 5 out of 5 strength.  Equal smile and grip.  Clear speech.  No noted ataxia in extremities bilateral Skin:  Skin is warm, dry and intact. No rash noted. Psychiatric: Mood and affect are normal. Speech and behavior are normal.  ____________________________________________   LABS (all labs ordered are listed, but only abnormal results are displayed)  Labs Reviewed  BASIC METABOLIC PANEL - Abnormal; Notable for the following components:      Result Value   Glucose, Bld 133 (*)    BUN 25 (*)    Creatinine, Ser 1.11 (*)    GFR, Estimated 48 (*)    All other components within normal limits  URINALYSIS, COMPLETE (UACMP) WITH MICROSCOPIC - Abnormal; Notable for the following components:   Hgb urine dipstick TRACE (*)    Leukocytes,Ua SMALL (*)    All other components within normal  limits  URINE CULTURE  CBC  CBG MONITORING, ED   ____________________________________________  EKG  Reviewed inter by me at 1600 Heart rate 79 QRS 89 QTc 420 Normal sinus rhythm, slight baseline artifact no evidence of ischemia ____________________________________________  RADIOLOGY  MR BRAIN WO CONTRAST  Result Date: 11/16/2020 CLINICAL DATA:  Dizziness.  Headache. EXAM: MRI HEAD WITHOUT CONTRAST TECHNIQUE: Multiplanar, multiecho pulse sequences of the brain and surrounding structures were obtained without intravenous contrast. COMPARISON:  None. FINDINGS: The study is moderately motion degraded. Brain: There is no evidence of an acute infarct, intracranial hemorrhage, mass, midline shift, or extra-axial fluid collection. Confluent T2 hyperintensities in the cerebral white matter and patchy T2 hyperintensities in the pons are nonspecific but compatible with severe chronic small vessel ischemic disease. There is moderate cerebral atrophy. No focal cerebellar insult is identified. Vascular: Major intracranial vascular flow voids are preserved. Skull and upper cervical spine: Unremarkable bone marrow signal para Sinuses/Orbits: Bilateral cataract extraction. Paranasal sinuses and mastoid air cells are clear. Other: Nonspecific 3 cm cutaneous/subcutaneous lesion in the right occipital scalp. IMPRESSION: 1. Motion degraded examination without evidence of an acute intracranial abnormality. 2. Severe chronic small vessel ischemic disease. Electronically Signed   By: Logan Bores M.D.   On: 11/16/2020 20:49     MRI brain reviewed some motion degradation but no evidence of acute abnormality noted. ____________________________________________   PROCEDURES  Procedure(s) performed: None  Procedures  Critical Care performed: No  ____________________________________________   INITIAL IMPRESSION / ASSESSMENT AND PLAN / ED COURSE  Pertinent labs & imaging results that were available during my  care of the patient were reviewed by me and considered in my medical decision making (see chart for details).   Patient presents for dizziness.  Her clinical exam very reassuring without obvious focal neurologic deficit.  Her only complaint really is a feeling of imbalance primarily with ambulation.  She is alert well oriented without distress her lab work reassuring, mild baseline renal insufficiency.  Blood pressure is somewhat elevated, but in the setting of dizziness will check orthostatics, but thankfully seems to suggest against hypotension as a potential cause.  She denies any infectious symptoms.  No cardiac or pulmonary symptoms.  Very reassuring exam, but clinical exam is not clearly suggestive of an etiology.  Discussed with the patient will obtain MRI  of the brain to evaluate and exclude acute stroke as a potential etiology.    Clinical Course as of 11/17/20 0005  Tue Nov 16, 2020  2155 Patient resting comfortably.  Reports she feels improved.  Family at bedside now as well.  Patient would like to be able to go home soon, discussed plan to trial ambulation and obtain a urinalysis.  Patient agreeable with this plan reports she feels pretty good now and actually feels little better after receiving fluids and is currently drinking a bottle of water [MQ]    Clinical Course User Index [MQ] Delman Kitten, MD   Patient resting comfortably, family at bedside.  MRI reviewed reassuring, patient normotensive ambulatory without distress or ongoing lightheadedness.  Appears appropriate for discharge.  Discussed careful return precautions with her and her family, and she will follow-up closely with her primary care.  Return precautions and treatment recommendations and follow-up discussed with the patient who is agreeable with the plan.   ____________________________________________   FINAL CLINICAL IMPRESSION(S) / ED DIAGNOSES  Final diagnoses:  Lightheadedness        Note:  This  document was prepared using Dragon voice recognition software and may include unintentional dictation errors       Delman Kitten, MD 11/17/20 0006

## 2020-11-16 NOTE — Telephone Encounter (Signed)
I spoke with patient & she stated that she has been dizzy since Friday. She feels that she may lose her balance. She said that she has no CP, SOB, vision changes, HA. She is only dizzy. I asked was her BP running normal & she stated this morning was 154/67 P 63. I advised due to her hx & BP usually running in the higher side even thought sounded like possible vertigo symptoms that she be evaluated in person. No appointments here in office & advised that her daughter take her to UC to be in the safe side. She said that her daughter was [place], but when she got home that she would have her talk her to [facility] walk-in if after 5p or possible [facility] across from [name] if earlier.

## 2020-11-16 NOTE — ED Notes (Signed)
Pt to ED c/o dizziness that started about 5d ago. States has been having to grab onto furniture when gets up and walks around in order to prevent fall. Denies vertigo, denies weakness.  Has also been having HA last few days. Denies CP, SOB. Denies NVD and urinary symptoms.

## 2020-11-16 NOTE — ED Triage Notes (Signed)
Pt reports that she has been getting dizzy since Friday, it is worse when she walks around. Pt is hard of hearing

## 2020-11-16 NOTE — Telephone Encounter (Signed)
Pt called and states that she has been dizzy since Friday. No other symptoms. She just wants PCP to note it. FYI

## 2020-11-16 NOTE — ED Notes (Addendum)
Orthostatic vitals  Lying--181/69, 84  Sitting--170/73, 81 **pt reports dizziness**  Standing--153/71, 86 **pt reports feeling very dizzy**  MD notified and aware

## 2020-11-16 NOTE — ED Notes (Signed)
Patient return from MRI. Daughter remains at the bedside

## 2020-11-16 NOTE — Telephone Encounter (Signed)
Pt in ED.  

## 2020-11-16 NOTE — ED Notes (Signed)
Patient transported to MRI via stretcher.

## 2020-11-16 NOTE — ED Notes (Signed)
Pt on phone with MRI screener. 

## 2020-11-16 NOTE — Telephone Encounter (Signed)
Call patient Please let her know that you discussed her symptoms with me and I agree with you very very much about her being evaluated person.  Dizziness can be something is non life threatening in the setting of dehydration or related to side effect of blood pressure medications or can be scary as a stroke or a TIA.    She had a TIA in the past, she would need to have an in person evaluation so blood pressure can evaluated (such as orthostatics), labs ordered, and consideration for neuroimaging such as CT of the head.  Please call her back and urged her not to delay as a thorough evaluation is absolutely needed to be safe.

## 2020-11-16 NOTE — Telephone Encounter (Signed)
LMTCB

## 2020-11-16 NOTE — ED Notes (Signed)
Care transferred, report received from Reina, RN 

## 2020-11-18 LAB — URINE CULTURE

## 2020-11-22 DIAGNOSIS — Z9842 Cataract extraction status, left eye: Secondary | ICD-10-CM | POA: Diagnosis not present

## 2020-11-22 DIAGNOSIS — H353132 Nonexudative age-related macular degeneration, bilateral, intermediate dry stage: Secondary | ICD-10-CM | POA: Diagnosis not present

## 2020-11-22 DIAGNOSIS — Z9841 Cataract extraction status, right eye: Secondary | ICD-10-CM | POA: Diagnosis not present

## 2020-11-22 DIAGNOSIS — H5203 Hypermetropia, bilateral: Secondary | ICD-10-CM | POA: Diagnosis not present

## 2020-12-17 ENCOUNTER — Other Ambulatory Visit: Payer: Self-pay | Admitting: Family

## 2020-12-21 ENCOUNTER — Ambulatory Visit (INDEPENDENT_AMBULATORY_CARE_PROVIDER_SITE_OTHER): Payer: Medicare Other

## 2020-12-21 VITALS — Ht 67.0 in | Wt 133.0 lb

## 2020-12-21 DIAGNOSIS — Z0000 Encounter for general adult medical examination without abnormal findings: Secondary | ICD-10-CM

## 2020-12-21 NOTE — Patient Instructions (Addendum)
Natalie [name] , Thank you for taking time to come for your Medicare Wellness Visit. I appreciate your ongoing commitment to your health goals. Please review the following plan we discussed and let me know if I can assist you in the future.   These are the goals we discussed:  Goals      Follow up with Primary Care Provider     As needed        This is a list of the screening recommended for you and due dates:  Health Maintenance  Topic Date Due   COVID-19 Vaccine (4 - Booster for Pfizer series) 01/06/2021*   Zoster (Shingles) Vaccine (1 of 2) 03/23/2021*   Tetanus Vaccine  10/07/2028   DEXA scan (bone density measurement)  Completed   HPV Vaccine  Aged Out   Flu Shot  Discontinued  *Topic was postponed. The date shown is not the original due date.   Advanced directives: on file  Conditions/risks identified: none new  Follow up in one year for your annual wellness visit    Preventive Care 65 Years and Older, Female Preventive care refers to lifestyle choices and visits with your health care provider that can promote health and wellness. What does preventive care include? A yearly physical exam. This is also called an annual well check. Dental exams once or twice a year. Routine eye exams. Ask your health care provider how often you should have your eyes checked. Personal lifestyle choices, including: Daily care of your teeth and gums. Regular physical activity. Eating a healthy diet. Avoiding tobacco and drug use. Limiting alcohol use. Practicing safe sex. Taking low-dose aspirin every day. Taking vitamin and mineral supplements as recommended by your health care provider. What happens during an annual well check? The services and screenings done by your health care provider during your annual well check will depend on your age, overall health, lifestyle risk factors, and family history of disease. Counseling  Your health care provider may ask you questions about  your: Alcohol use. Tobacco use. Drug use. Emotional well-being. Home and relationship well-being. Sexual activity. Eating habits. History of falls. Memory and ability to understand (cognition). Work and work Statistician. Reproductive health. Screening  You may have the following tests or measurements: Height, weight, and BMI. Blood pressure. Lipid and cholesterol levels. These may be checked every 5 years, or more frequently if you are over 3 years old. Skin check. Lung cancer screening. You may have this screening every year starting at age 81 if you have a 30-pack-year history of smoking and currently smoke or have quit within the past 15 years. Fecal occult blood test (FOBT) of the stool. You may have this test every year starting at age 80. Flexible sigmoidoscopy or colonoscopy. You may have a sigmoidoscopy every 5 years or a colonoscopy every 10 years starting at age 4. Hepatitis C blood test. Hepatitis B blood test. Sexually transmitted disease (STD) testing. Diabetes screening. This is done by checking your blood sugar (glucose) after you have not eaten for a while (fasting). You may have this done every 1-3 years. Bone density scan. This is done to screen for osteoporosis. You may have this done starting at age 56. Mammogram. This may be done every 1-2 years. Talk to your health care provider about how often you should have regular mammograms. Talk with your health care provider about your test results, treatment options, and if necessary, the need for more tests. Vaccines  Your health care provider may recommend certain  vaccines, such as: Influenza vaccine. This is recommended every year. Tetanus, diphtheria, and acellular pertussis (Tdap, Td) vaccine. You may need a Td booster every 10 years. Zoster vaccine. You may need this after age 96. Pneumococcal 13-valent conjugate (PCV13) vaccine. One dose is recommended after age 77. Pneumococcal polysaccharide (PPSV23) vaccine.  One dose is recommended after age 11. Talk to your health care provider about which screenings and vaccines you need and how often you need them. This information is not intended to replace advice given to you by your health care provider. Make sure you discuss any questions you have with your health care provider. Document Released: 03/26/2015 Document Revised: 11/17/2015 Document Reviewed: 12/29/2014 Elsevier Interactive Patient Education  2017 [employer] Prevention in the Home Falls can cause injuries. They can happen to people of all ages. There are many things you can do to make your home safe and to help prevent falls. What can I do on the outside of my home? Regularly fix the edges of walkways and driveways and fix any cracks. Remove anything that might make you trip as you walk through a door, such as a raised step or threshold. Trim any bushes or trees on the path to your home. Use bright outdoor lighting. Clear any walking paths of anything that might make someone trip, such as rocks or tools. Regularly check to see if handrails are loose or broken. Make sure that both sides of any steps have handrails. Any raised decks and porches should have guardrails on the edges. Have any leaves, snow, or ice cleared regularly. Use sand or salt on walking paths during winter. Clean up any spills in your garage right away. This includes oil or grease spills. What can I do in the bathroom? Use night lights. Install grab bars by the toilet and in the tub and shower. Do not use towel bars as grab bars. Use non-skid mats or decals in the tub or shower. If you need to sit down in the shower, use a plastic, non-slip stool. Keep the floor dry. Clean up any water that spills on the floor as soon as it happens. Remove soap buildup in the tub or shower regularly. Attach bath mats securely with double-sided non-slip rug tape. Do not have throw rugs and other things on the floor that can make  you trip. What can I do in the bedroom? Use night lights. Make sure that you have a light by your bed that is easy to reach. Do not use any sheets or blankets that are too big for your bed. They should not hang down onto the floor. Have a firm chair that has side arms. You can use this for support while you get dressed. Do not have throw rugs and other things on the floor that can make you trip. What can I do in the kitchen? Clean up any spills right away. Avoid walking on wet floors. Keep items that you use a lot in easy-to-reach places. If you need to reach something above you, use a strong step stool that has a grab bar. Keep electrical cords out of the way. Do not use floor polish or wax that makes floors slippery. If you must use wax, use non-skid floor wax. Do not have throw rugs and other things on the floor that can make you trip. What can I do with my stairs? Do not leave any items on the stairs. Make sure that there are handrails on both sides of the stairs  and use them. Fix handrails that are broken or loose. Make sure that handrails are as long as the stairways. Check any carpeting to make sure that it is firmly attached to the stairs. Fix any carpet that is loose or worn. Avoid having throw rugs at the top or bottom of the stairs. If you do have throw rugs, attach them to the floor with carpet tape. Make sure that you have a light switch at the top of the stairs and the bottom of the stairs. If you do not have them, ask someone to add them for you. What else can I do to help prevent falls? Wear shoes that: Do not have high heels. Have rubber bottoms. Are comfortable and fit you well. Are closed at the toe. Do not wear sandals. If you use a stepladder: Make sure that it is fully opened. Do not climb a closed stepladder. Make sure that both sides of the stepladder are locked into place. Ask someone to hold it for you, if possible. Clearly mark and make sure that you can  see: Any grab bars or handrails. First and last steps. Where the edge of each step is. Use tools that help you move around (mobility aids) if they are needed. These include: Canes. Walkers. Scooters. Crutches. Turn on the lights when you go into a dark area. Replace any light bulbs as soon as they burn out. Set up your furniture so you have a clear path. Avoid moving your furniture around. If any of your floors are uneven, fix them. If there are any pets around you, be aware of where they are. Review your medicines with your doctor. Some medicines can make you feel dizzy. This can increase your chance of falling. Ask your doctor what other things that you can do to help prevent falls. This information is not intended to replace advice given to you by your health care provider. Make sure you discuss any questions you have with your health care provider. Document Released: 12/24/2008 Document Revised: 08/05/2015 Document Reviewed: 04/03/2014 Elsevier Interactive Patient Education  2017 [employer].

## 2020-12-21 NOTE — Progress Notes (Signed)
Subjective:   Natalie [name] is a 87 y.o. female who presents for Medicare Annual (Subsequent) preventive examination.  Review of Systems    No ROS.  Medicare Wellness Virtual Visit.  Visual/audio telehealth visit, UTA vital signs.   See social history for additional risk factors.   Cardiac Risk Factors include: advanced age (>21men, >18 women)     Objective:    Today's Vitals   12/21/20 0822  Weight: 133 lb (60.3 kg)  Height: 5\' 7"  (1.702 m)   Body mass index is 20.83 kg/m.  Advanced Directives 12/21/2020 11/16/2020 12/19/2019 03/02/2019 02/28/2019 02/25/2019  Does Patient Have a Medical Advance Directive? Yes No Yes Yes Yes No  Type of Paramedic of [place];Living will - [place];Living will Living will;Healthcare Power of Attorney Living will;Healthcare Power of Attorney -  Does patient want to make changes to medical advance directive? No - Patient declined - No - Patient declined No - Patient declined - -  Copy of [place] in Chart? Yes - validated most recent copy scanned in chart (See row information) - No - copy requested Yes - validated most recent copy scanned in chart (See row information) Yes - validated most recent copy scanned in chart (See row information) -  Would patient like information on creating a medical advance directive? - - - No - Patient declined No - Patient declined No - Patient declined    Current Medications (verified) Outpatient Encounter Medications as of 12/21/2020  Medication Sig   aspirin EC 81 MG tablet Take 81 mg by mouth daily. In preparation for surgery 12/18/ After stopping plavix   atorvastatin (LIPITOR) 40 MG tablet TAKE 1 TABLET BY MOUTH EVERY DAY   Calcium Citrate-Vitamin D 250-200 MG-UNIT TABS Take 250 mg by mouth every morning.   cholecalciferol (VITAMIN D3) 25 MCG (1000 UT) tablet Take 1,000 Units by mouth daily.    fluticasone (FLONASE) 50 MCG/ACT nasal spray Place  2 sprays into both nostrils daily.   [DISCONTINUED] amLODipine (NORVASC) 10 MG tablet TAKE 1 TABLET BY MOUTH EVERY DAY   [DISCONTINUED] lisinopril (ZESTRIL) 5 MG tablet TAKE ONE TABLET BY MOUTH DAILY.   [DISCONTINUED] metoprolol tartrate (LOPRESSOR) 50 MG tablet TAKE 0.5 TABLETS (25 MG TOTAL) BY MOUTH 2 (TWO) TIMES DAILY.   [DISCONTINUED] Tiotropium Bromide Monohydrate 2.5 MCG/ACT AERS Inhale 2 puffs into the lungs daily. (Patient not taking: Reported on 10/12/2020)   [DISCONTINUED] traZODone (DESYREL) 50 MG tablet TAKE 0.5 TABLETS (25 MG TOTAL) BY MOUTH AT BEDTIME.   No facility-administered encounter medications on file as of 12/21/2020.    Allergies (verified) Influenza vaccines   History: Past Medical History:  Diagnosis Date   Allergies    hay fever   Arthritis    Breast cancer, left ([place]) 2015   Left total mastectomy   COPD (chronic obstructive pulmonary disease) ([place])    GERD (gastroesophageal reflux disease)    Hyperlipemia    Hypertension    Stroke ([place]) 2002   TIA   Past Surgical History:  Procedure Laterality Date   BREAST BIOPSY  2014   BREAST LUMPECTOMY  2014   left side   ENDARTERECTOMY Left 02/28/2019   Procedure: ENDARTERECTOMY CAROTID;  Surgeon: Katha Cabal, MD;  Location: [facility] ORS;  Service: Vascular;  Laterality: Left;   EVACUATION OF CERVICAL HEMATOMA Left 02/28/2019   Procedure: EVACUATION OF HEMATOMA;  Surgeon: Katha Cabal, MD;  Location: [facility] ORS;  Service: Vascular;  Laterality: Left;  EYE SURGERY Bilateral    cataract extractions   JOINT REPLACEMENT     MASTECTOMY Left May 9th 2017   RHINOPLASTY  1956   TONSILLECTOMY  1939   TOTAL HIP ARTHROPLASTY Right    head of the femur replaced due to fracture   Family History  Problem Relation Age of Onset   Breast cancer Mother    Lung cancer Father    Social History   Socioeconomic History   Marital status: Single    Spouse name: Not on file   Number of children: Not on file    Years of education: Not on file   Highest education level: Not on file  Occupational History   Occupation: CRNA    Comment: retired  Tobacco Use   Smoking status: Former   Smokeless tobacco: Never   Tobacco comments:    20 years ago  Media planner   Vaping Use: Never used  Substance and Sexual Activity   Alcohol use: Yes    Comment: occassionally   Drug use: Never   Sexual activity: Not Currently  Other Topics Concern   Not on file  Social History Narrative   Patient lives with her daughter   Social Determinants of Health   Financial Resource Strain: Low Risk    Difficulty of Paying Living Expenses: Not hard at all  Food Insecurity: No Food Insecurity   Worried About Charity fundraiser in the Last Year: Never true   Arboriculturist in the Last Year: Never true  Transportation Needs: No Transportation Needs   Lack of Transportation (Medical): No   Lack of Transportation (Non-Medical): No  Physical Activity: Insufficiently Active   Days of Exercise per Week: 6 days   Minutes of Exercise per Session: 20 min  Stress: No Stress Concern Present   Feeling of Stress : Not at all  Social Connections: Unknown   Frequency of Communication with Friends and Family: Not on file   Frequency of Social Gatherings with Friends and Family: More than three times a week   Attends Religious Services: Not on Electrical engineer or Organizations: Not on file   Attends Archivist Meetings: Not on file   Marital Status: Not on file    Tobacco Counseling Counseling given: Not Answered Tobacco comments: 20 years ago   Clinical Intake:  Pre-visit preparation completed: Yes        Diabetes: No  How often do you need to have someone help you when you read instructions, pamphlets, or other written materials from your doctor or pharmacy?: 1 - Never   Interpreter Needed?: No      Activities of Daily Living In your present state of health, do you have any  difficulty performing the following activities: 12/21/2020  Hearing? Y  Comment Hearing aid  Vision? N  Difficulty concentrating or making decisions? N  Walking or climbing stairs? N  Dressing or bathing? N  Doing errands, shopping? Y  Comment Limits driving to specific locations  Preparing Food and eating ? N  Using the Toilet? N  In the past six months, have you accidently leaked urine? N  Do you have problems with loss of bowel control? N  Managing your Medications? N  Managing your Finances? N  Housekeeping or managing your Housekeeping? Y  Comment Maid assist  Some recent data might be hidden    Patient Care Team: [name], FNP as PCP - General (Family Medicine)  Indicate any recent Medical Services you may have received from other than [facility] providers in the past year (date may be approximate).     Assessment:   This is a routine wellness examination for Natalie [name].  I connected with Patte today by telephone and verified that I am speaking with the correct person using two identifiers. Location patient: home Location provider: work Persons participating in the virtual visit: patient, Marine scientist.    I discussed the limitations, risks, security and privacy concerns of performing an evaluation and management service by telephone and the availability of in person appointments. The patient expressed understanding and verbally consented to this telephonic visit.    Interactive audio and video telecommunications were attempted between this provider and patient, however failed, due to patient having technical difficulties OR patient did not have access to video capability.  We continued and completed visit with audio only.  Some vital signs may be absent or patient reported.   Hearing/Vision screen Hearing Screening - Comments:: Hearing aids Vision Screening - Comments:: Wears corrective lenses   Dietary issues and exercise activities discussed: Current Exercise Habits:  Home exercise routine, Type of exercise: walking, Time (Minutes): 20, Frequency (Times/Week): 6, Weekly Exercise (Minutes/Week): 120, Intensity: Mild Healthy diet Good water intake   Goals Addressed             This Visit's Progress    Follow up with Primary Care Provider       As needed       Depression Screen PHQ 2/9 Scores 12/21/2020 10/12/2020 06/08/2020 03/10/2020 12/19/2019 08/13/2019 11/22/2018  PHQ - 2 Score 0 0 - - 0 1 0  PHQ- 9 Score - 0 - - - 2 2  Exception Documentation - - Other- indicate reason in comment box Medical reason - - -  Not completed - - Discussed with patient. - - - -    Fall Risk Fall Risk  12/21/2020 12/19/2019 10/21/2019 11/22/2018  Falls in the past year? 0 0 1 0  Number falls in past yr: 0 0 0 -  Injury with Fall? - - 0 -  Risk for fall due to : (No Data) - History of fall(s) History of fall(s)  Risk for fall due to: Comment Walker in use - - -  Follow up Falls evaluation completed Falls evaluation completed Falls evaluation completed Falls evaluation completed    [place]: Adequate lighting in your home to reduce risk of falls? Yes   ASSISTIVE DEVICES UTILIZED TO PREVENT FALLS: Life alert? No  Use of a cane, walker or w/c? Yes , rollator walker  TIMED UP AND GO: Was the test performed? No .   Cognitive Function:  Patient is alert and oriented x3.  Enjoys reading. Manages her own finances and medications. MMSE/6CIT deferred. Normal by direct communication/observation.    6CIT Screen 12/21/2020 12/19/2019  What Year? 0 points 0 points  What month? 0 points 0 points  What time? 0 points 0 points  Count back from 20 0 points -  Months in reverse 0 points 0 points  Repeat phrase 0 points -  Total Score 0 -    Immunizations Immunization History  Administered Date(s) Administered   Influenza,inj,Quad PF,6+ Mos 12/31/2012   Influenza-Unspecified 01/03/2011, 12/29/2011   PFIZER Comirnaty(Gray Top)Covid-19  Tri-Sucrose Vaccine 06/28/2020   PFIZER(Purple Top)SARS-COV-2 Vaccination 03/27/2019, 04/17/2019   Pneumococcal Conjugate-13 08/19/2014   Pneumococcal Polysaccharide-23 03/13/1996, 03/13/2017   Tdap 10/08/2018   Zoster, Live 03/13/2004, 03/13/2006  Shingrix vaccine- Due, Education has been provided regarding the importance of this vaccine. Advised may receive this vaccine at local pharmacy or Health Dept. Aware to provide a copy of the vaccination record if obtained from local pharmacy or Health Dept. Verbalized acceptance and understanding. Deferred.   Health Maintenance Health Maintenance  Topic Date Due   COVID-19 Vaccine (4 - Booster for Pfizer series) 01/06/2021 (Originally 09/20/2020)   Zoster Vaccines- Shingrix (1 of 2) 03/23/2021 (Originally 08/11/1948)   TETANUS/TDAP  10/07/2028   DEXA SCAN  Completed   HPV VACCINES  Aged Out   INFLUENZA VACCINE  Discontinued   Lung Cancer Screening: (Low Dose CT Chest recommended if Age 74-80 years, 30 pack-year currently smoking OR have quit w/in 15years.) does not qualify.   Hepatitis C Screening: does not qualify.  Vision Screening: Recommended annual ophthalmology exams for early detection of glaucoma and other disorders of the eye.  Dental Screening: Recommended annual dental exams for proper oral hygiene  Community Resource Referral / Chronic Care Management: CRR required this visit?  No   CCM required this visit?  No      Plan:   Keep all routine maintenance appointments.   I have personally reviewed and noted the following in the patient's chart:   Medical and social history Use of alcohol, tobacco or illicit drugs  Current medications and supplements including opioid prescriptions. Not taking opioid. Functional ability and status Nutritional status Physical activity Advanced directives List of other physicians Hospitalizations, surgeries, and ER visits in previous 12 months Vitals Screenings to include cognitive,  depression, and falls Referrals and appointments  In addition, I have reviewed and discussed with patient certain preventive protocols, quality metrics, and best practice recommendations. A written personalized care plan for preventive services as well as general preventive health recommendations were provided to patient.     Varney Biles, LPN   [id number]

## 2021-01-07 ENCOUNTER — Other Ambulatory Visit: Payer: Self-pay | Admitting: Family

## 2021-02-06 ENCOUNTER — Other Ambulatory Visit: Payer: Self-pay | Admitting: Family

## 2021-02-06 DIAGNOSIS — I10 Essential (primary) hypertension: Secondary | ICD-10-CM

## 2021-02-14 ENCOUNTER — Ambulatory Visit (INDEPENDENT_AMBULATORY_CARE_PROVIDER_SITE_OTHER): Payer: Medicare Other | Admitting: Family

## 2021-02-14 ENCOUNTER — Other Ambulatory Visit: Payer: Self-pay

## 2021-02-14 DIAGNOSIS — N6310 Unspecified lump in the right breast, unspecified quadrant: Secondary | ICD-10-CM | POA: Diagnosis not present

## 2021-02-14 DIAGNOSIS — N189 Chronic kidney disease, unspecified: Secondary | ICD-10-CM | POA: Diagnosis not present

## 2021-02-14 DIAGNOSIS — I10 Essential (primary) hypertension: Secondary | ICD-10-CM

## 2021-02-14 DIAGNOSIS — I6522 Occlusion and stenosis of left carotid artery: Secondary | ICD-10-CM | POA: Diagnosis not present

## 2021-02-14 NOTE — Patient Instructions (Signed)
Nice to see you!   

## 2021-02-14 NOTE — Assessment & Plan Note (Signed)
Chronic, stable.  Continue isinopril 5 mg, Lopressor 50 mg and amlodipine 10 mg.

## 2021-02-14 NOTE — Assessment & Plan Note (Signed)
Chronic, stable.   She declines further follow up, surveillance with nephrology. I will monitor renal indices periodically.

## 2021-02-14 NOTE — Progress Notes (Signed)
Subjective:    Patient ID: Natalie [name], female    DOB: 07-14-1929, 87 y.o.   MRN: [id number]  CC: Natalie [name] is a 87 y.o. female who presents today for follow up.   HPI: She feels well today.   No further episodes of dizziness. She is drinking more water.  Carotid atherosclerosis-compliant with aspirin 81 mg, Lipitor 40 mg Hypertension-at home 120/ 65-70  at home. compliant with lisinopril 5 mg, Lopressor 50 mg and amlodipine 10 mg. No cp, sob.     CKD-last seen by Dr. Lanora [name] July of this year.  She declines any follow-up for this  No right breast pain.    HISTORY:  Past Medical History:  Diagnosis Date   Allergies    hay fever   Arthritis    Breast cancer, left ([place]) 2015   Left total mastectomy   COPD (chronic obstructive pulmonary disease) ([place])    GERD (gastroesophageal reflux disease)    Hyperlipemia    Hypertension    Stroke ([place]) 2002   TIA   Past Surgical History:  Procedure Laterality Date   BREAST BIOPSY  2014   BREAST LUMPECTOMY  2014   left side   ENDARTERECTOMY Left 02/28/2019   Procedure: ENDARTERECTOMY CAROTID;  Surgeon: Natalie Cabal, MD;  Location: [facility] ORS;  Service: Vascular;  Laterality: Left;   EVACUATION OF CERVICAL HEMATOMA Left 02/28/2019   Procedure: EVACUATION OF HEMATOMA;  Surgeon: Natalie Cabal, MD;  Location: [facility] ORS;  Service: Vascular;  Laterality: Left;   EYE SURGERY Bilateral    cataract extractions   JOINT REPLACEMENT     MASTECTOMY Left May 9th 2017   [place]   TOTAL HIP ARTHROPLASTY Right    head of the femur replaced due to fracture   Family History  Problem Relation Age of Onset   Breast cancer Mother    Lung cancer Father     Allergies: Influenza vaccines Current Outpatient Medications on File Prior to Visit  Medication Sig Dispense Refill   amLODipine (NORVASC) 10 MG tablet TAKE 1 TABLET BY MOUTH EVERY DAY 90 tablet 0   aspirin EC 81 MG tablet Take 81 mg by  mouth daily. In preparation for surgery 12/18/ After stopping plavix     atorvastatin (LIPITOR) 40 MG tablet TAKE 1 TABLET BY MOUTH EVERY DAY 90 tablet 3   Calcium Citrate-Vitamin D 250-200 MG-UNIT TABS Take 250 mg by mouth every morning.     cholecalciferol (VITAMIN D3) 25 MCG (1000 UT) tablet Take 1,000 Units by mouth daily.      fluticasone (FLONASE) 50 MCG/ACT nasal spray Place 2 sprays into both nostrils daily.     lisinopril (ZESTRIL) 5 MG tablet TAKE 1 TABLET BY MOUTH EVERY DAY 90 tablet 0   metoprolol tartrate (LOPRESSOR) 50 MG tablet TAKE 1/2 TABLET BY MOUTH TWICE A DAY 90 tablet 0   Tiotropium Bromide Monohydrate 2.5 MCG/ACT AERS Inhale 2 puffs into the lungs daily. 4 g 3   traZODone (DESYREL) 50 MG tablet TAKE 0.5 TABLETS (25 MG TOTAL) BY MOUTH AT BEDTIME. 45 tablet 3   No current facility-administered medications on file prior to visit.    Social History   Tobacco Use   Smoking status: Former   Smokeless tobacco: Never   Tobacco comments:    20 years ago  Vaping Use   Vaping Use: Never used  Substance Use Topics   Alcohol use: Yes    Comment:  occassionally   Drug use: Never    Review of Systems  Constitutional:  Negative for chills and fever.  Respiratory:  Negative for cough and shortness of breath.   Cardiovascular:  Negative for chest pain and palpitations.  Gastrointestinal:  Negative for nausea and vomiting.  Neurological:  Negative for dizziness (resolved).     Objective:    BP (!) 128/50   Pulse 78   Temp (!) 96.1 F (35.6 C) (Temporal)   Ht 5\' 7"  (1.702 m)   Wt 129 lb 8 oz (58.7 kg)   SpO2 97%   BMI 20.28 kg/m  BP Readings from Last 3 Encounters:  02/14/21 (!) 128/50  11/16/20 125/79  10/12/20 (!) 142/58   Wt Readings from Last 3 Encounters:  02/14/21 129 lb 8 oz (58.7 kg)  12/21/20 133 lb (60.3 kg)  11/16/20 133 lb (60.3 kg)    Physical Exam Vitals reviewed.  Constitutional:      Appearance: She is well-developed.  Eyes:      Conjunctiva/sclera: Conjunctivae normal.  Cardiovascular:     Rate and Rhythm: Normal rate and regular rhythm.     Pulses: Normal pulses.     Heart sounds: Normal heart sounds.  Pulmonary:     Effort: Pulmonary effort is normal.     Breath sounds: Normal breath sounds. No wheezing, rhonchi or rales.  Skin:    General: Skin is warm and dry.  Neurological:     Mental Status: She is alert.  Psychiatric:        Speech: Speech normal.        Behavior: Behavior normal.        Thought Content: Thought content normal.       Assessment & Plan:   Problem List Items Addressed This Visit       Cardiovascular and Mediastinum   Carotid atherosclerosis    Chronic, stable.  Pending updating lipid panel.  she has follow-up scheduled in 1 year with vascular.  Continue aspirin 81 mg, Lipitor 40 mg      Essential hypertension    Chronic, stable.  Continue isinopril 5 mg, Lopressor 50 mg and amlodipine 10 mg.        Genitourinary   CKD (chronic kidney disease)    Chronic, stable.   She declines further follow up, surveillance with nephrology. I will monitor renal indices periodically.         Other   Breast mass, right    She continues to politely decline any further imaging for right breast mass        I am having Natalie [name] maintain her Calcium Citrate-Vitamin D, cholecalciferol, fluticasone, aspirin EC, atorvastatin, traZODone, Tiotropium Bromide Monohydrate, amLODipine, lisinopril, and metoprolol tartrate.   No orders of the defined types were placed in this encounter.   Return precautions given.   Risks, benefits, and alternatives of the medications and treatment plan prescribed today were discussed, and patient expressed understanding.   Education regarding symptom management and diagnosis given to patient on AVS.  Continue to follow with Natalie Hawthorne, FNP for routine health maintenance.   Natalie [name] and I agreed with plan.   Natalie Paris,  FNP

## 2021-02-14 NOTE — Assessment & Plan Note (Signed)
Chronic, stable.  Pending updating lipid panel.  she has follow-up scheduled in 1 year with vascular.  Continue aspirin 81 mg, Lipitor 40 mg

## 2021-02-14 NOTE — Assessment & Plan Note (Signed)
She continues to politely decline any further imaging for right breast mass

## 2021-02-24 ENCOUNTER — Telehealth: Payer: Self-pay | Admitting: *Deleted

## 2021-02-24 NOTE — Telephone Encounter (Signed)
Please place future orders for lab appt.  

## 2021-02-25 ENCOUNTER — Other Ambulatory Visit: Payer: Self-pay | Admitting: Family

## 2021-02-25 DIAGNOSIS — I10 Essential (primary) hypertension: Secondary | ICD-10-CM

## 2021-02-28 ENCOUNTER — Ambulatory Visit: Payer: Medicare Other | Admitting: Dermatology

## 2021-03-02 ENCOUNTER — Ambulatory Visit: Payer: Medicare Other | Admitting: Dermatology

## 2021-03-03 ENCOUNTER — Other Ambulatory Visit (INDEPENDENT_AMBULATORY_CARE_PROVIDER_SITE_OTHER): Payer: Medicare Other

## 2021-03-03 ENCOUNTER — Other Ambulatory Visit: Payer: Self-pay

## 2021-03-03 DIAGNOSIS — I10 Essential (primary) hypertension: Secondary | ICD-10-CM | POA: Diagnosis not present

## 2021-03-03 LAB — CBC WITH DIFFERENTIAL/PLATELET
Basophils Absolute: 0.1 10*3/uL (ref 0.0–0.1)
Basophils Relative: 1 % (ref 0.0–3.0)
Eosinophils Absolute: 0.3 10*3/uL (ref 0.0–0.7)
Eosinophils Relative: 4.9 % (ref 0.0–5.0)
HCT: 38 % (ref 36.0–46.0)
Hemoglobin: 12.8 g/dL (ref 12.0–15.0)
Lymphocytes Relative: 23.4 % (ref 12.0–46.0)
Lymphs Abs: 1.3 10*3/uL (ref 0.7–4.0)
MCHC: 33.5 g/dL (ref 30.0–36.0)
MCV: 93.3 fl (ref 78.0–100.0)
Monocytes Absolute: 0.6 10*3/uL (ref 0.1–1.0)
Monocytes Relative: 10.4 % (ref 3.0–12.0)
Neutro Abs: 3.5 10*3/uL (ref 1.4–7.7)
Neutrophils Relative %: 60.3 % (ref 43.0–77.0)
Platelets: 180 10*3/uL (ref 150.0–400.0)
RBC: 4.08 Mil/uL (ref 3.87–5.11)
RDW: 13.9 % (ref 11.5–15.5)
WBC: 5.7 10*3/uL (ref 4.0–10.5)

## 2021-03-03 LAB — LIPID PANEL
Cholesterol: 137 mg/dL (ref 0–200)
HDL: 54.9 mg/dL (ref >39.00)
LDL Cholesterol: 62 mg/dL (ref 0–99)
NonHDL: 82.4
Total CHOL/HDL Ratio: 3
Triglycerides: 102 mg/dL (ref 0.0–149.0)
VLDL: 20.4 mg/dL (ref 0.0–40.0)

## 2021-03-03 LAB — BASIC METABOLIC PANEL
BUN: 18 mg/dL (ref 6–23)
CO2: 28 mEq/L (ref 19–32)
Calcium: 9.8 mg/dL (ref 8.4–10.5)
Chloride: 101 mEq/L (ref 96–112)
Creatinine, Ser: 1.13 mg/dL (ref 0.40–1.20)
GFR: 43.12 mL/min — ABNORMAL LOW (ref >60.00)
Glucose, Bld: 116 mg/dL — ABNORMAL HIGH (ref 70–99)
Potassium: 4.3 mEq/L (ref 3.5–5.1)
Sodium: 137 mEq/L (ref 135–145)

## 2021-03-15 ENCOUNTER — Telehealth: Payer: Self-pay

## 2021-03-15 NOTE — Telephone Encounter (Signed)
See result note.  

## 2021-03-15 NOTE — Telephone Encounter (Signed)
LMTCB for lab results.  

## 2021-03-15 NOTE — Telephone Encounter (Signed)
Patient called in stated she was returning the call, Please call patient @ [phone]

## 2021-03-24 ENCOUNTER — Other Ambulatory Visit: Payer: Self-pay | Admitting: Family

## 2021-04-06 ENCOUNTER — Other Ambulatory Visit: Payer: Self-pay | Admitting: Family

## 2021-04-12 ENCOUNTER — Ambulatory Visit: Payer: Medicare Other | Admitting: Dermatology

## 2021-04-12 ENCOUNTER — Other Ambulatory Visit: Payer: Self-pay

## 2021-04-12 DIAGNOSIS — L814 Other melanin hyperpigmentation: Secondary | ICD-10-CM

## 2021-04-12 DIAGNOSIS — L821 Other seborrheic keratosis: Secondary | ICD-10-CM

## 2021-04-12 DIAGNOSIS — L578 Other skin changes due to chronic exposure to nonionizing radiation: Secondary | ICD-10-CM | POA: Diagnosis not present

## 2021-04-12 DIAGNOSIS — L409 Psoriasis, unspecified: Secondary | ICD-10-CM

## 2021-04-12 DIAGNOSIS — L720 Epidermal cyst: Secondary | ICD-10-CM

## 2021-04-12 NOTE — Patient Instructions (Signed)
Melanoma ABCDEs  Melanoma is the most dangerous type of skin cancer, and is the leading cause of death from skin disease.  You are more likely to develop melanoma if you: Have light-colored skin, light-colored eyes, or red or blond hair Spend a lot of time in the sun Tan regularly, either outdoors or in a tanning bed Have had blistering sunburns, especially during childhood Have a close family member who has had a melanoma Have atypical moles or large birthmarks  Early detection of melanoma is key since treatment is typically straightforward and cure rates are extremely high if we catch it early.   The first sign of melanoma is often a change in a mole or a new dark spot.  The ABCDE system is a way of remembering the signs of melanoma.  A for asymmetry:  The two halves do not match. B for border:  The edges of the growth are irregular. C for color:  A mixture of colors are present instead of an even brown color. D for diameter:  Melanomas are usually (but not always) greater than 23mm - the size of a pencil eraser. E for evolution:  The spot keeps changing in size, shape, and color.  Please check your skin once per month between visits. You can use a small mirror in front and a large mirror behind you to keep an eye on the back side or your body.   If you see any new or changing lesions before your next follow-up, please call to schedule a visit.  Please continue daily skin protection including broad spectrum sunscreen SPF 30+ to sun-exposed areas, reapplying every 2 hours as needed when you're outdoors.     If You Need Anything After Your Visit  If you have any questions or concerns for your doctor, please call our main line at [phone] and press option 4 to reach your doctor's medical assistant. If no one answers, please leave a voicemail as directed and we will return your call as soon as possible. Messages left after 4 pm will be answered the following business day.   You may  also send [name] a message via [place]. We typically respond to MyChart messages within 1-2 business days.  For prescription refills, please ask your pharmacy to contact our office. Our fax number is [phone].  If you have an urgent issue when the clinic is closed that cannot wait until the next business day, you can page your doctor at the number below.    Please note that while we do our best to be available for urgent issues outside of office hours, we are not available 24/7.   If you have an urgent issue and are unable to reach [name], you may choose to seek medical care at your doctor's office, retail clinic, urgent care center, or emergency room.  If you have a medical emergency, please immediately call 911 or go to the emergency department.  Pager Numbers  - Dr. Nehemiah Massed: [phone]  - Dr. Laurence Ferrari: [phone]  - Dr. Nicole Kindred: [phone]  In the event of inclement weather, please call our main line at [phone] for an update on the status of any delays or closures.  Dermatology Medication Tips: Please keep the boxes that topical medications come in in order to help keep track of the instructions about where and how to use these. Pharmacies typically print the medication instructions only on the boxes and not directly on the medication tubes.   If your medication is too expensive, please  contact our office at [phone] option 4 or send [name] a message through [place].   We are unable to tell what your co-pay for medications will be in advance as this is different depending on your insurance coverage. However, we may be able to find a substitute medication at lower cost or fill out paperwork to get insurance to cover a needed medication.   If a prior authorization is required to get your medication covered by your insurance company, please allow [name] 1-2 business days to complete this process.  Drug prices often vary depending on where the prescription is filled and some pharmacies  may offer cheaper prices.  The website www.goodrx.com contains coupons for medications through different pharmacies. The prices here do not account for what the cost may be with help from insurance (it may be cheaper with your insurance), but the website can give you the price if you did not use any insurance.  - You can print the associated coupon and take it with your prescription to the pharmacy.  - You may also stop by our office during regular business hours and pick up a GoodRx coupon card.  - If you need your prescription sent electronically to a different pharmacy, notify our office through [facility] or by phone at [phone] option 4.     Si Usted Necesita Algo Despus de Su Visita  Tambin puede enviarnos un mensaje a travs de Pharmacist, community. Por lo general respondemos a los mensajes de MyChart en el transcurso de 1 a 2 das hbiles.  Para renovar recetas, por favor pida a su farmacia que se ponga en contacto con nuestra oficina. Harland Dingwall de fax es [place] [phone].  Si tiene un asunto urgente cuando la clnica est cerrada y que no puede esperar hasta el siguiente da hbil, puede llamar/localizar a su doctor(a) al nmero que aparece a continuacin.   Por favor, tenga en cuenta que aunque hacemos todo lo posible para estar disponibles para asuntos urgentes fuera del horario de [place], no estamos disponibles las 24 horas del da, los 7 das de la [place].   Si tiene un problema urgente y no puede comunicarse con nosotros, puede optar por buscar atencin mdica  en el consultorio de su doctor(a), en una clnica privada, en un centro de atencin urgente o en una sala de emergencias.  Si tiene Engineering geologist, por favor llame inmediatamente al 911 o vaya a la sala de emergencias.  Nmeros de bper  - Dr. Nehemiah Massed: [phone]  - Dra. Moye: [phone]  - Dra. Nicole Kindred: [phone]  En caso de inclemencias del [place], por favor llame a Johnsie Kindred principal  al [phone] para una actualizacin sobre el [place] de cualquier retraso o cierre.  Consejos para la medicacin en dermatologa: Por favor, guarde las cajas en las que vienen los medicamentos de uso tpico para ayudarle a seguir las instrucciones sobre dnde y cmo usarlos. Las farmacias generalmente imprimen las instrucciones del medicamento slo en las cajas y no directamente en los tubos del [place].   Si su medicamento es muy caro, por favor, pngase en contacto con Zigmund Daniel llamando al [phone] y presione la opcin 4 o envenos un mensaje a travs de Pharmacist, community.   No podemos decirle cul ser su copago por los medicamentos por adelantado ya que esto es diferente dependiendo de la cobertura de su seguro. Sin embargo, es posible que podamos encontrar un medicamento sustituto a Electrical engineer un formulario para que el seguro [place]  medicamento que se considera necesario.   Si se requiere una autorizacin previa para que su compaa de seguros [place] su medicamento, por favor permtanos de 1 a 2 das hbiles para completar este proceso.  Los precios de los medicamentos varan con frecuencia dependiendo del Environmental consultant de dnde se surte la receta y alguna farmacias pueden ofrecer precios ms baratos.  El sitio web www.goodrx.com tiene cupones para medicamentos de Airline pilot. Los precios aqu no tienen en cuenta lo que podra costar con la ayuda del seguro (puede ser ms barato con su seguro), pero el sitio web puede darle el precio si no utiliz Research scientist (physical sciences).  - Puede imprimir el cupn correspondiente y llevarlo con su receta a la farmacia.  - Tambin puede pasar por nuestra oficina durante el horario de atencin regular y Charity fundraiser una tarjeta de cupones de GoodRx.  - Si necesita que su receta se enve electrnicamente a una farmacia diferente, informe a nuestra oficina a travs de MyChart de [place] o por telfono llamando al [phone] y presione la opcin 4.

## 2021-04-12 NOTE — Progress Notes (Signed)
° °  New Patient Visit  Subjective  Natalie [name] is a 88 y.o. female who presents for the following: Skin Problem (Patient here today for a growth at posterior scalp, present for at least a few years. Patient advises it can be drained and has an odor to it. Patient does not have a hx of skin cancer and defers FBSE.).  Patient advises she does have a hx of psoriasis but it is not bothering her and she is not currently treating.  The following portions of the chart were reviewed this encounter and updated as appropriate:   Tobacco   Allergies   Meds   Problems   Med Hx   Surg Hx   Fam Hx       Review of Systems:  No other skin or systemic complaints except as noted in HPI or Assessment and Plan.  Objective  Well appearing patient in no apparent distress; mood and affect are within normal limits.  A focused examination was performed including scalp, arms, back. Relevant physical exam findings are noted in the Assessment and Plan.  Right Occipital Scalp Subcutaneous nodule with punctum approximately 2.4 cm  back, arms Skin clear at examined areas today     Assessment & Plan  Epidermal inclusion cyst Right Occipital Scalp  Cyst with symptoms and/or recent change.  Discussed surgical excision to remove, including resulting scar and possible recurrence.  Patient will schedule for surgery. Pre-op information given.      Psoriasis back, arms  Hx of psoriasis diagnosed by dermatology per patient. No evidence on exam today. No joint pain. Will monitor.   Psoriasis is a chronic non-curable, but treatable genetic/hereditary disease that may have other systemic features affecting other organ systems such as joints (Psoriatic Arthritis). It is associated with an increased risk of inflammatory bowel disease, heart disease, non-alcoholic fatty liver disease, and depression.     Seborrheic Keratoses - Stuck-on, waxy, tan-brown papules and/or plaques  - Benign-appearing - Discussed  benign etiology and prognosis. - Observe - Call for any changes  Lentigines - Scattered tan macules - Due to sun exposure - Benign-appering, observe - Recommend daily broad spectrum sunscreen SPF 30+ to sun-exposed areas, reapply every 2 hours as needed. - Call for any changes  Actinic Damage - chronic, secondary to cumulative UV radiation exposure/sun exposure over time - diffuse scaly erythematous macules with underlying dyspigmentation - Recommend daily broad spectrum sunscreen SPF 30+ to sun-exposed areas, reapply every 2 hours as needed.  - Recommend staying in the shade or wearing long sleeves, sun glasses (UVA+UVB protection) and wide brim hats (4-inch brim around the entire circumference of the hat). - Call for new or changing lesions.  Return for Surgery for cyst at occipital scalp.  Graciella Belton, RMA, am acting as scribe for Forest Gleason, MD .  Documentation: I have reviewed the above documentation for accuracy and completeness, and I agree with the above.  Forest Gleason, MD

## 2021-04-20 ENCOUNTER — Encounter: Payer: Self-pay | Admitting: Dermatology

## 2021-05-04 ENCOUNTER — Encounter: Payer: Self-pay | Admitting: Dermatology

## 2021-05-04 ENCOUNTER — Other Ambulatory Visit: Payer: Self-pay

## 2021-05-04 ENCOUNTER — Ambulatory Visit (INDEPENDENT_AMBULATORY_CARE_PROVIDER_SITE_OTHER): Payer: Medicare Other | Admitting: Dermatology

## 2021-05-04 DIAGNOSIS — L720 Epidermal cyst: Secondary | ICD-10-CM

## 2021-05-04 DIAGNOSIS — D485 Neoplasm of uncertain behavior of skin: Secondary | ICD-10-CM

## 2021-05-04 MED ORDER — DOXYCYCLINE MONOHYDRATE 100 MG PO CAPS: 100.0000 mg | ORAL_CAPSULE | Freq: Two times a day (BID) | ORAL | 0 refills | Status: AC

## 2021-05-04 NOTE — Patient Instructions (Addendum)

## 2021-05-04 NOTE — Progress Notes (Signed)
° °  Follow-Up Visit   Subjective  Natalie [name] is a 88 y.o. female who presents for the following: Procedure (Patient here today for excision of cyst at scalp. ).  Patient accompanied by daughter.   The following portions of the chart were reviewed this encounter and updated as appropriate:   Tobacco   Allergies   Meds   Problems   Med Hx   Surg Hx   Fam Hx       Review of Systems:  No other skin or systemic complaints except as noted in HPI or Assessment and Plan.  Objective  Well appearing patient in no apparent distress; mood and affect are within normal limits.  A focused examination was performed including scalp. Relevant physical exam findings are noted in the Assessment and Plan.  Right Occipital Scalp Subcutaneous nodule with punctum 3.1 cm    Assessment & Plan  Neoplasm of uncertain behavior of skin Right Occipital Scalp  Skin excision  Lesion length (cm):  3.1 Total excision diameter (cm):  3 Informed consent: discussed and consent obtained   Timeout: patient name, date of birth, surgical site, and procedure verified   Procedure prep:  Patient was prepped and draped in usual sterile fashion Prep type:  Chlorhexidine Anesthesia: the lesion was anesthetized in a standard fashion   Anesthetic:  1% lidocaine w/ epinephrine 1-100,000 buffered w/ 8.4% NaHCO3 (6 cc lido w/epi, 3 cc 0.25% bupivicaine) Hemostasis achieved with: suture, pressure and electrodesiccation   Outcome: patient tolerated procedure well with no complications   Post-procedure details: wound care instructions given   Additional details:  Mupirocin and a pressure dressing applied  Skin repair Complexity:  Intermediate Final length (cm):  3.6 Informed consent: discussed and consent obtained   Timeout: patient name, date of birth, surgical site, and procedure verified   Procedure prep:  Patient was prepped and draped in usual sterile fashion Prep type:  Chlorhexidine Anesthesia: the lesion was  anesthetized in a standard fashion   Anesthetic:  1% lidocaine w/ epinephrine 1-100,000 local infiltration Reason for type of repair: reduce tension to allow closure, reduce the risk of dehiscence, infection, and necrosis, reduce subcutaneous dead space and avoid a hematoma, allow closure of the large defect, preserve normal anatomy, preserve normal anatomical and functional relationships and enhance both functionality and cosmetic results   Undermining: edges undermined and area extensively undermined   Subcutaneous layers (deep stitches):  Suture size:  4-0 Suture type: Vicryl (polyglactin 910)   Stitches:  Buried vertical mattress Fine/surface layer approximation (top stitches):  Suture size:  5-0 Suture type: Prolene (polypropylene)   Suture removal (days):  14 Hemostasis achieved with: suture, pressure and electrodesiccation Outcome: patient tolerated procedure well with no complications   Post-procedure details: wound care instructions given   Additional details:  Mupirocin and a pressure bandage applied   Specimen 1 - Surgical pathology Differential Diagnosis: r/o Cyst vs Other  Check Margins: No Subcutaneous nodule with punctum approximately 2.4 cm   Return in about 2 weeks (around 05/18/2021) for Suture Removal.  Graciella Belton, RMA, am acting as scribe for Forest Gleason, MD .   Documentation: I have reviewed the above documentation for accuracy and completeness, and I agree with the above.  Forest Gleason, MD

## 2021-05-05 ENCOUNTER — Telehealth: Payer: Self-pay

## 2021-05-05 NOTE — Telephone Encounter (Signed)
LM on VM calling to check on her post surgery, please return my call

## 2021-05-05 NOTE — Telephone Encounter (Signed)
-----   Message from Alfonso Patten, MD sent at 05/05/2021  5:13 PM EST ----- Skin (M), right occipital scalp EPIDERMAL INCLUSION CYST "benign cyst, no additional treatment needed"  MAs please call. Thank you!

## 2021-05-11 ENCOUNTER — Telehealth: Payer: Self-pay

## 2021-05-11 NOTE — Telephone Encounter (Addendum)
Called and discussed results with patient. Patient verbalized understanding and denied further questions.  ? ? ?----- Message from Alfonso Patten, MD sent at 05/05/2021  5:13 PM EST ----- ?Skin (M), right occipital scalp ?EPIDERMAL INCLUSION CYST "benign cyst, no additional treatment needed" ? ?MAs please call. Thank you! ? ? ?

## 2021-05-18 ENCOUNTER — Other Ambulatory Visit: Payer: Self-pay

## 2021-05-18 ENCOUNTER — Encounter: Payer: Self-pay | Admitting: Dermatology

## 2021-05-18 ENCOUNTER — Ambulatory Visit (INDEPENDENT_AMBULATORY_CARE_PROVIDER_SITE_OTHER): Payer: Medicare Other | Admitting: Dermatology

## 2021-05-18 DIAGNOSIS — Z4802 Encounter for removal of sutures: Secondary | ICD-10-CM

## 2021-05-18 DIAGNOSIS — L720 Epidermal cyst: Secondary | ICD-10-CM

## 2021-05-18 NOTE — Progress Notes (Signed)
? ?  Follow-Up Visit ?  ?Subjective  ?Natalie [name] is a 88 y.o. female who presents for the following: Suture / Staple Removal (S/P epidermal cyst excision at right occipital scalp). ? ?Daughter with patient.  ? ?The following portions of the chart were reviewed this encounter and updated as appropriate:   ?  ? ?Review of Systems: No other skin or systemic complaints except as noted in HPI or Assessment and Plan. ? ? ?Objective  ?Well appearing patient in no apparent distress; mood and affect are within normal limits. ? ?A focused examination was performed including head, including the scalp, face, neck, nose, ears, eyelids, and lips. Relevant physical exam findings are noted in the Assessment and Plan. ? ? ?Assessment & Plan  ? ?Encounter for Removal of Sutures ?- Incision site at the right occipital scalp is clean, dry and intact ?- Wound cleansed, sutures removed, wound cleansed and steri strips applied.  ?- Discussed pathology results showing epidermal inclusion cyst  ?- Scars remodel for a full year. ?- Patient advised to call with any concerns or if they notice any new or changing lesions.  ? ?Return if symptoms worsen or fail to improve. ? ?I, Emelia Salisbury, CMA, am acting as scribe for Brendolyn Patty, MD. ? ?Documentation: I have reviewed the above documentation for accuracy and completeness, and I agree with the above. ? ?Brendolyn Patty MD  ? ?

## 2021-05-18 NOTE — Patient Instructions (Signed)

## 2021-06-06 ENCOUNTER — Telehealth: Payer: Self-pay | Admitting: Family

## 2021-06-06 NOTE — Telephone Encounter (Signed)
Spoke to patients  Daughter "[name]" about coming with Mom to next scheduled appointment to discuss possible dementia and  unusual behaviors ?

## 2021-06-06 NOTE — Telephone Encounter (Signed)
Call daughter ?Pt has upcoming appt with me ? ?Please ask daughter to attend  ?

## 2021-06-06 NOTE — Telephone Encounter (Signed)
Randi from access nurse called in stating she have a pt daughter on the line that is concern about her mother having dementia... Pt daughter is requesting for someone to call her mom to come in the office to get checked out... Advise Pt daughter of pt upcoming appt... Pt daughter stated that her and her brothers think that pt is forgetting thing a lot more then usual... Pt daughter stated that pt does get angry at times... Pt daughter requesting callback...  ?

## 2021-06-13 ENCOUNTER — Telehealth: Payer: Self-pay

## 2021-06-13 NOTE — Telephone Encounter (Signed)
FYI patient's daughter [name] called and stated .that her mom was thought to have TIA by paramedics. Pt was checked out by them & they recommended going to ED but patient refused. According to EMS daughter said that patient's vitals were normal. She had no one sided weakness, no slurred speech and was able to equally smile. Face was symmetrical. She state that since patient has seemed fine & her balance is just somewhat unsteady. She did almost fall today & her daughter caught her when they were out to lunch. She said that prior to supposed TIA that she was showing worsening signs of dementia & forgetfulness in general. She said that since TIA that actually she has been more clear her daughter feels & memory is better?  ? ?I of coursed advised ED for acute imagining & daughter did not feel that she needed to go now, but patient also refuses. She will take her if she feels another TIA occurs or call 911. I tried to get patient in sooner but was unable to with scheduling. She is scheduled Wednesday at 10:30.  ?

## 2021-06-15 ENCOUNTER — Ambulatory Visit
Admission: RE | Admit: 2021-06-15 | Discharge: 2021-06-15 | Disposition: A | Discharge status: Home / Self Care | Payer: Medicare Other | Source: Ambulatory Visit | Attending: Family | Admitting: Family

## 2021-06-15 ENCOUNTER — Encounter: Payer: Self-pay | Admitting: Family

## 2021-06-15 ENCOUNTER — Ambulatory Visit (INDEPENDENT_AMBULATORY_CARE_PROVIDER_SITE_OTHER): Payer: Medicare Other | Admitting: Family

## 2021-06-15 ENCOUNTER — Telehealth (INDEPENDENT_AMBULATORY_CARE_PROVIDER_SITE_OTHER): Payer: Self-pay

## 2021-06-15 ENCOUNTER — Ambulatory Visit (INDEPENDENT_AMBULATORY_CARE_PROVIDER_SITE_OTHER): Payer: Medicare Other

## 2021-06-15 VITALS — BP 118/82 | HR 72 | Temp 97.6°F | Ht 65.0 in | Wt 123.0 lb

## 2021-06-15 DIAGNOSIS — R519 Headache, unspecified: Secondary | ICD-10-CM | POA: Insufficient documentation

## 2021-06-15 DIAGNOSIS — F320 Major depressive disorder, single episode, mild: Secondary | ICD-10-CM | POA: Diagnosis not present

## 2021-06-15 DIAGNOSIS — Z0000 Encounter for general adult medical examination without abnormal findings: Secondary | ICD-10-CM

## 2021-06-15 DIAGNOSIS — M542 Cervicalgia: Secondary | ICD-10-CM | POA: Diagnosis not present

## 2021-06-15 DIAGNOSIS — R413 Other amnesia: Secondary | ICD-10-CM

## 2021-06-15 DIAGNOSIS — R29810 Facial weakness: Secondary | ICD-10-CM | POA: Diagnosis not present

## 2021-06-15 LAB — COMPREHENSIVE METABOLIC PANEL
ALT: 17 U/L (ref 0–35)
AST: 21 U/L (ref 0–37)
Albumin: 4.4 g/dL (ref 3.5–5.2)
Alkaline Phosphatase: 55 U/L (ref 39–117)
BUN: 28 mg/dL — ABNORMAL HIGH (ref 6–23)
CO2: 25 mEq/L (ref 19–32)
Calcium: 10.1 mg/dL (ref 8.4–10.5)
Chloride: 102 mEq/L (ref 96–112)
Creatinine, Ser: 1.17 mg/dL (ref 0.40–1.20)
GFR: 41.27 mL/min — ABNORMAL LOW (ref >60.00)
Glucose, Bld: 113 mg/dL — ABNORMAL HIGH (ref 70–99)
Potassium: 4.6 mEq/L (ref 3.5–5.1)
Sodium: 137 mEq/L (ref 135–145)
Total Bilirubin: 0.6 mg/dL (ref 0.2–1.2)
Total Protein: 7 g/dL (ref 6.0–8.3)

## 2021-06-15 LAB — CBC WITH DIFFERENTIAL/PLATELET
Basophils Absolute: 0.1 10*3/uL (ref 0.0–0.1)
Basophils Relative: 0.9 % (ref 0.0–3.0)
Eosinophils Absolute: 0.2 10*3/uL (ref 0.0–0.7)
Eosinophils Relative: 2.1 % (ref 0.0–5.0)
HCT: 37.1 % (ref 36.0–46.0)
Hemoglobin: 12.5 g/dL (ref 12.0–15.0)
Lymphocytes Relative: 15.4 % (ref 12.0–46.0)
Lymphs Abs: 1.1 10*3/uL (ref 0.7–4.0)
MCHC: 33.8 g/dL (ref 30.0–36.0)
MCV: 94.4 fl (ref 78.0–100.0)
Monocytes Absolute: 0.7 10*3/uL (ref 0.1–1.0)
Monocytes Relative: 9.2 % (ref 3.0–12.0)
Neutro Abs: 5.3 10*3/uL (ref 1.4–7.7)
Neutrophils Relative %: 72.4 % (ref 43.0–77.0)
Platelets: 179 10*3/uL (ref 150.0–400.0)
RBC: 3.93 Mil/uL (ref 3.87–5.11)
RDW: 13.2 % (ref 11.5–15.5)
WBC: 7.3 10*3/uL (ref 4.0–10.5)

## 2021-06-15 LAB — B12 AND FOLATE PANEL
Folate: 17.7 ng/mL (ref >5.9)
Vitamin B-12: 247 pg/mL (ref 211–911)

## 2021-06-15 LAB — TSH: TSH: 1.4 u[IU]/mL (ref 0.35–5.50)

## 2021-06-15 IMAGING — MR MR HEAD WO/W CM
22 series · 47 of 48 positions shown · IV contrast (5ml Gadavist)
Comparison: [DATE]

CLINICAL DATA: Headache, left facial droop

EXAM:
MRI HEAD WITHOUT AND WITH CONTRAST
TECHNIQUE: Multiplanar, multiecho pulse sequences of the brain and surrounding
structures were obtained without and with intravenous contrast.
CONTRAST:  5mL GADAVIST GADOBUTROL 1 MMOL/ML IV SOLN

[Series 5: ax dwi_tracew · axial · 3.0mm · 0.65mm/px · z∈[-67,+88]mm · 4 of 95 slices shown]
[im 1/95]
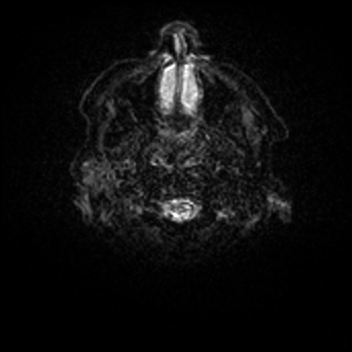
[im 32/95]
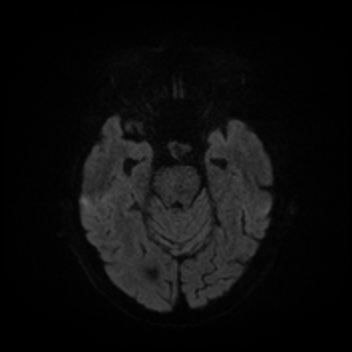
[im 63/95]
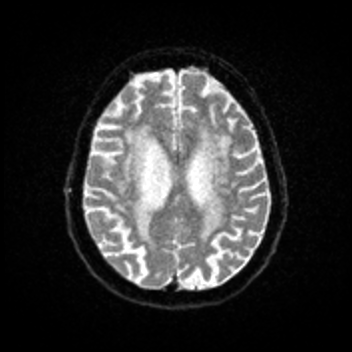
[im 95/95]
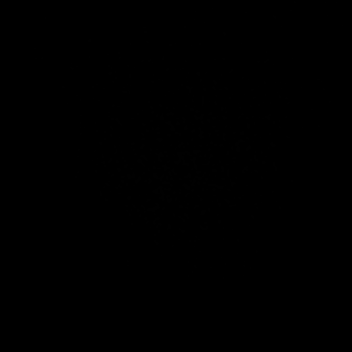

[Series 6: ax dwi_adc · axial · 3.0mm · 0.65mm/px · z∈[-67,+81]mm · 2 of 46 slices shown]
[im 1/46]
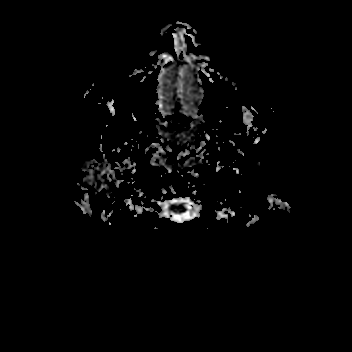
[im 46/46]
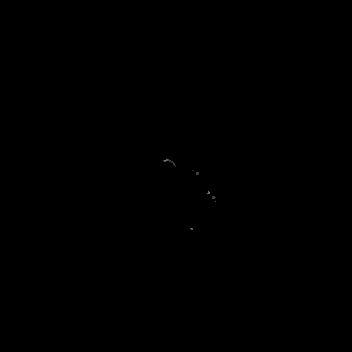

[Series 7: cor dwi_tracew · coronal · 5.0mm · 0.65mm/px · 2 of 40 slices shown]
[im 1/40]
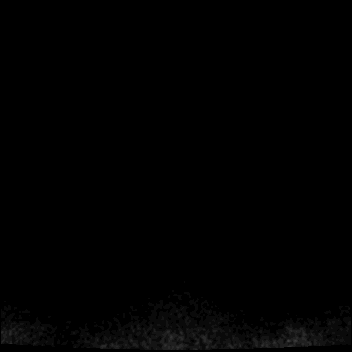
[im 40/40]
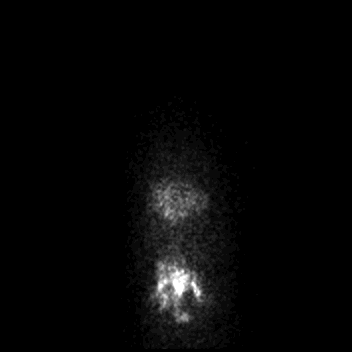

[Series 8: cor dwi_adc · coronal · 5.0mm · 0.65mm/px · 2 of 38 slices shown]
[im 1/38]
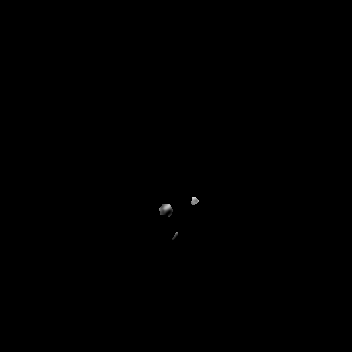
[im 38/38]
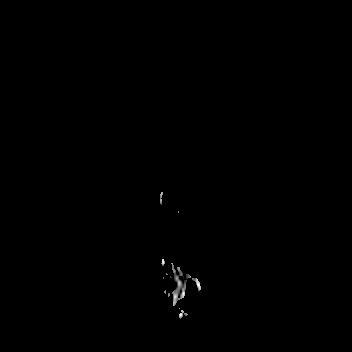

[Series 9: T1 · sagittal · 5.0mm · 0.62mm/px · 1 of 24 slices shown (1 of 2)]
[im 1/24]
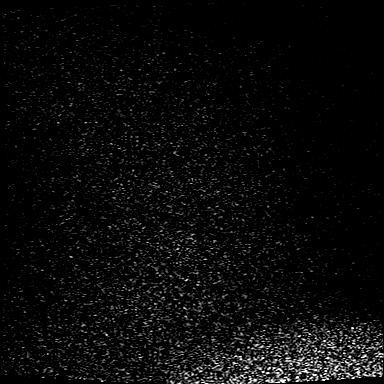

[Series 10: T2 · axial · 5.0mm · 0.53mm/px · 1 of 27 slices shown (1 of 5)]
[im 1/27]
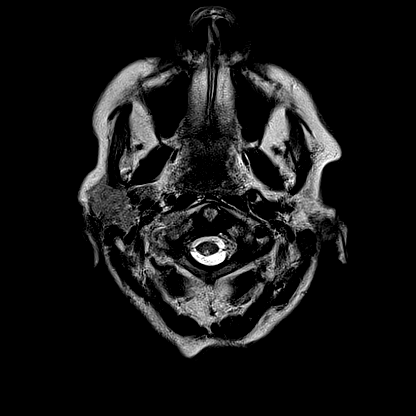

[Series 11: mag_images · axial · 3.0mm · 0.90mm/px · z∈[-77,+96]mm · 3 of 59 slices shown (1 of 2)]
[im 1/59]
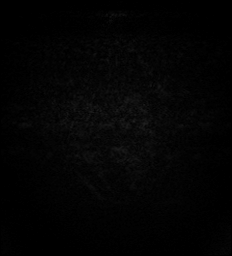
[im 30/59]
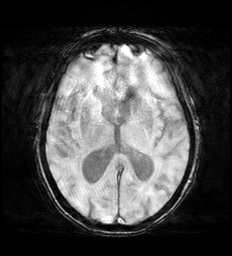
[im 59/59]
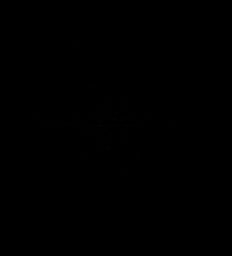

[Series 12: pha_images · axial · 3.0mm · 0.90mm/px · z∈[-74,+99]mm · 3 of 56 slices shown (1 of 2)]
[im 1/56]
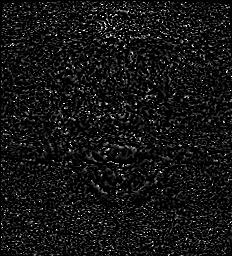
[im 28/56]
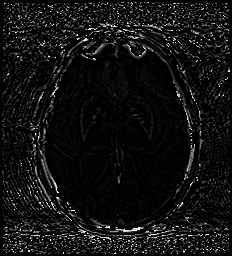
[im 56/56]
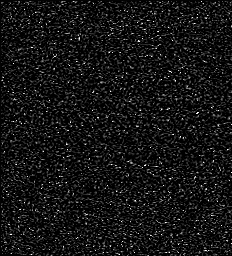

[Series 13: swi_images · axial · 3.0mm · 0.90mm/px · z∈[-77,+99]mm · 3 of 59 slices shown (1 of 2)]
[im 1/59]
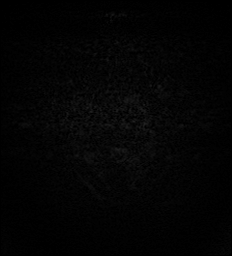
[im 30/59]
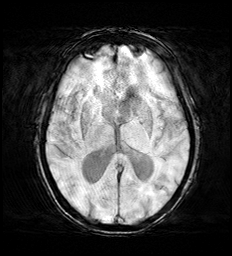
[im 59/59]
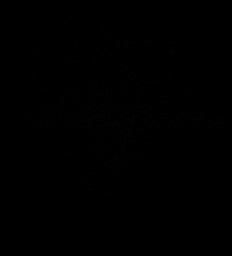

[Series 15: FLAIR · axial · 3.0mm · 0.53mm/px · z∈[-70,+91]mm · 2 of 55 slices shown]
[im 1/55]
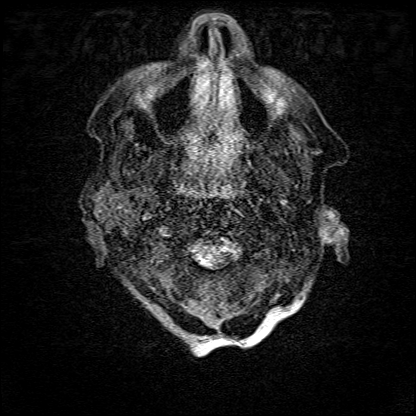
[im 55/55]
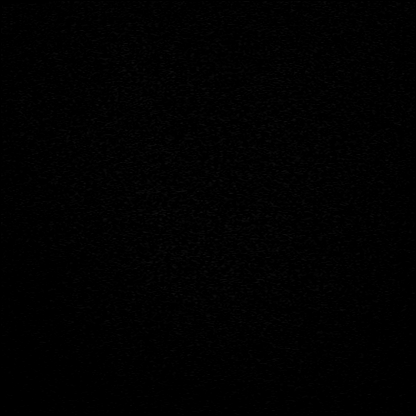

[Series 16: mag_images · axial · 3.0mm · 0.90mm/px · z∈[-77,+99]mm · 3 of 60 slices shown (2 of 2)]
[im 1/60]
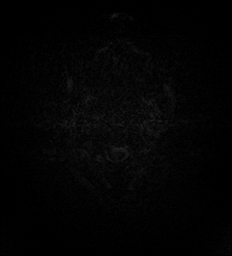
[im 30/60]
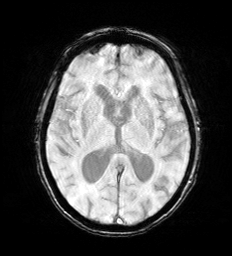
[im 60/60]
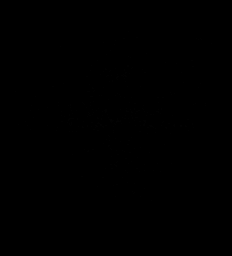

[Series 17: pha_images · axial · 3.0mm · 0.90mm/px · z∈[-74,+90]mm · 3 of 56 slices shown (2 of 2)]
[im 1/56]
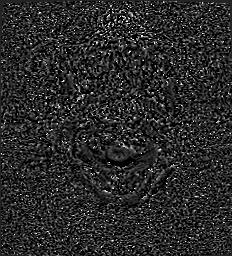
[im 28/56]
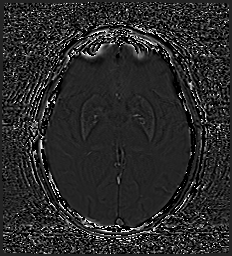
[im 56/56]
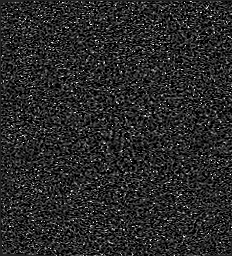

[Series 18: swi_images · axial · 3.0mm · 0.90mm/px · z∈[-77,+7]mm · 2 of 58 slices shown (2 of 2)]
[im 1/58]
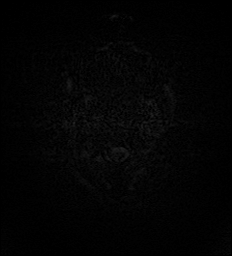
[im 29/58]
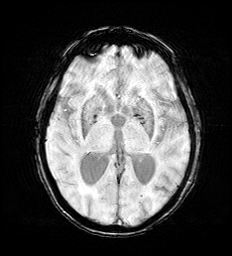

[Series 20: T2 · axial · 5.0mm · 0.53mm/px · 1 of 27 slices shown (2 of 5)]
[im 1/27]
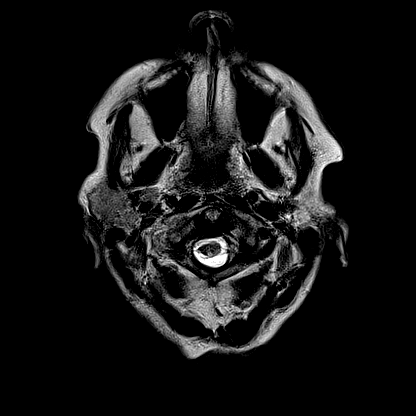

[Series 21: T1 · axial · 1.0mm · 0.98mm/px · z∈[-76,+99]mm · 8 of 175 slices shown (2 of 2)]
[im 1/175]
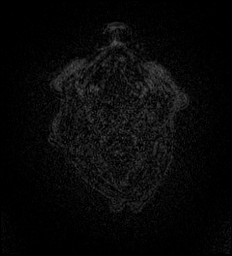
[im 25/175]
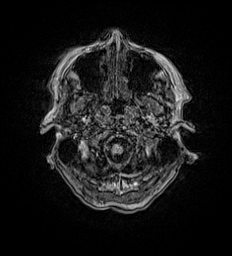
[im 50/175]
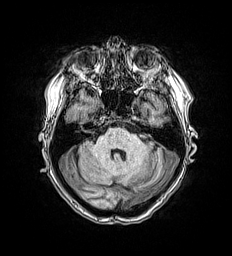
[im 75/175]
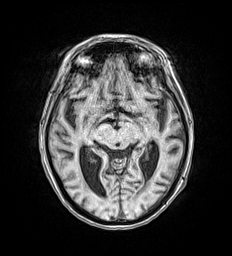
[im 100/175]
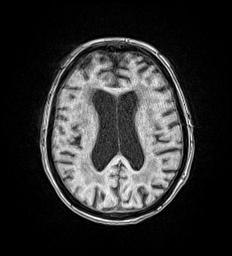
[im 125/175]
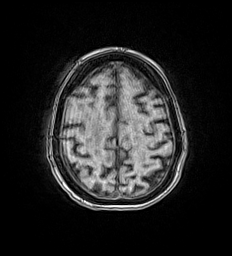
[im 150/175]
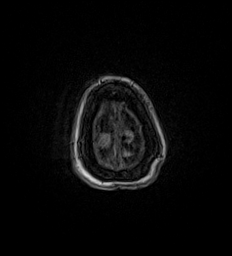
[im 175/175]
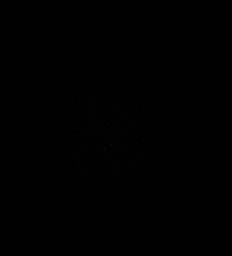

[Series 22: T2 · axial · 5.0mm · 0.45mm/px · 1 of 27 slices shown (3 of 5)]
[im 1/27]
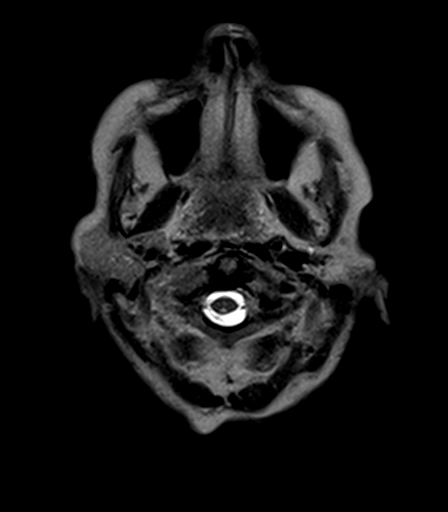

[Series 23: T2 · coronal · 5.0mm · 0.45mm/px · 1 of 31 slices shown (4 of 5)]
[im 1/31]
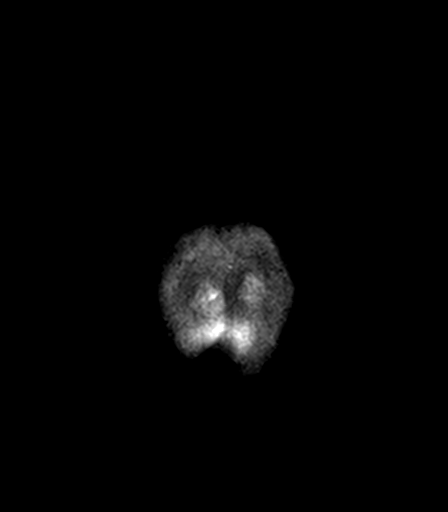

[Series 24: T1 post-contrast · axial · 5.0mm · 0.90mm/px · 1 of 27 slices shown (1 of 4)]
[im 1/27]
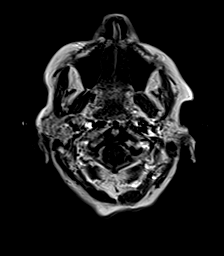

[Series 25: T1 post-contrast · coronal · 5.0mm · 0.90mm/px · 1 of 31 slices shown (2 of 4)]
[im 1/31]
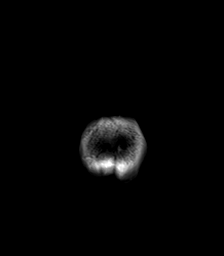

[Series 26: T2 · coronal · 5.0mm · 0.45mm/px · 1 of 31 slices shown (5 of 5)]
[im 1/31]
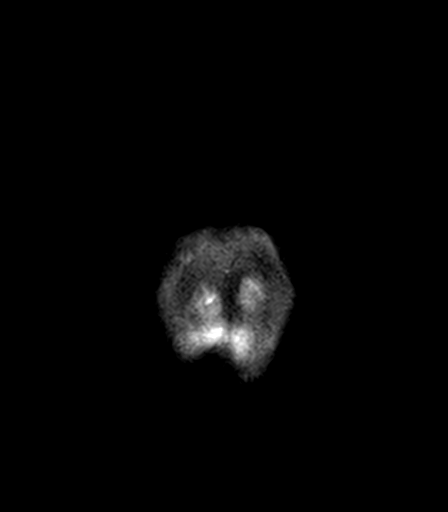

[Series 27: T1 post-contrast · sagittal · 5.0mm · 0.62mm/px · 1 of 24 slices shown (3 of 4)]
[im 1/24]
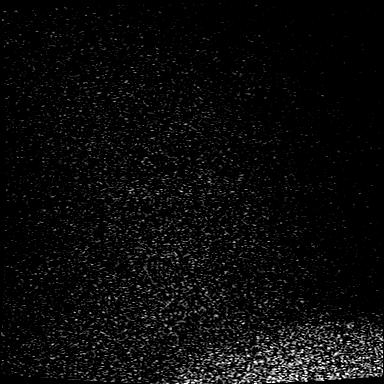

[Series 28: T1 post-contrast · sagittal · 5.0mm · 0.62mm/px · 1 of 24 slices shown (4 of 4)]
[im 1/24]
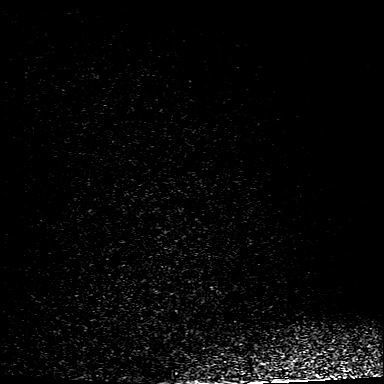

[47 of 48 positions shown; findings below may reference images not displayed]

FINDINGS: Brain: No restricted diffusion to suggest acute or subacute infarct.
No acute hemorrhage, mass, mass effect, or midline shift. No
hydrocephalus or extra-axial collection. No abnormal parenchymal or
meningeal enhancement.

Moderate cerebral atrophy. Confluent T2 hyperintense signal in the
periventricular white matter, likely the sequela of moderate chronic
small vessel ischemic disease.

Vascular: Normal flow voids.

Skull and upper cervical spine: T1 and T2 hyperintense focus in the
right occipital bone, consistent with a benign hemangioma. Otherwise
normal marrow signal. Degenerative changes in the cervical spine,
with possible moderate spinal canal stenosis at C3-C4.

Sinuses/Orbits: No acute finding. Status post bilateral lens
replacements.

Other: The mastoids are well aerated.
IMPRESSION: 1. No acute intracranial process. No etiology is seen for the
patient's headache or left facial droop.
2. Degenerative changes in the partially imaged cervical spine,
concerning for moderate spinal canal stenosis at C3-C4. This could
be further evaluated with a noncontrast MRI if clinically indicated.

## 2021-06-15 IMAGING — DX DG CERVICAL SPINE COMPLETE 4+V
6 series · 6 of 6 positions shown · non-contrast
Comparison: None.

CLINICAL DATA: Posterior neck pain.  Headaches.

EXAM:
CERVICAL SPINE - COMPLETE 4+ VIEW

[cervical spine ap]
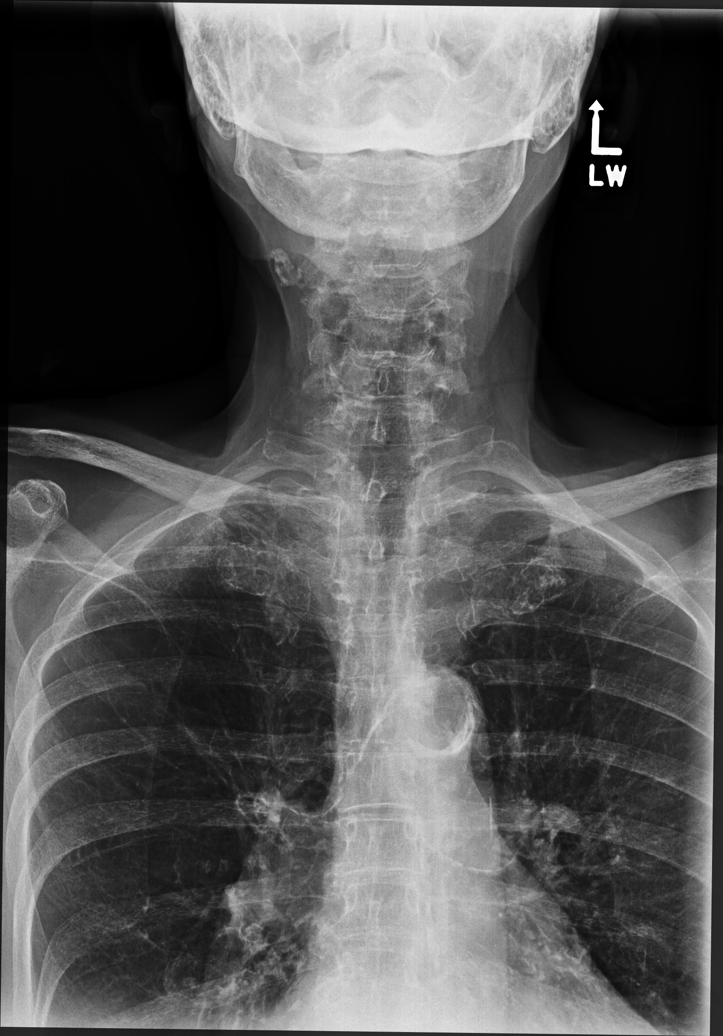

[cervical spine oblique (1 of 2)]
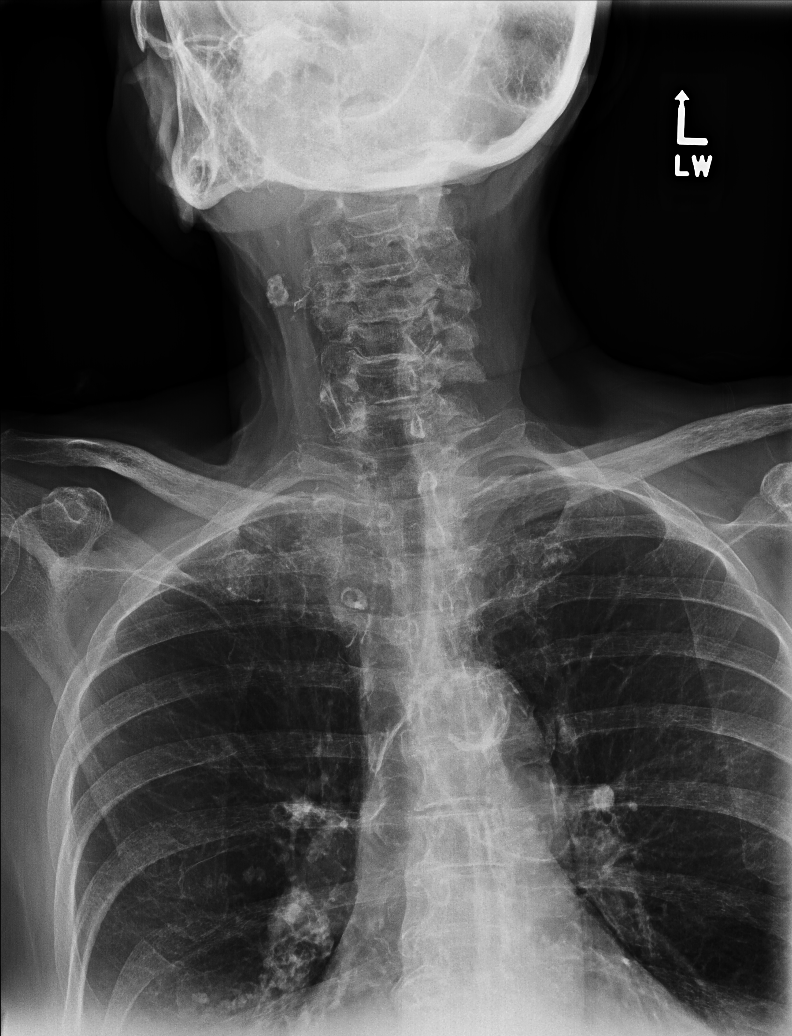

[cervical spine oblique (2 of 2)]
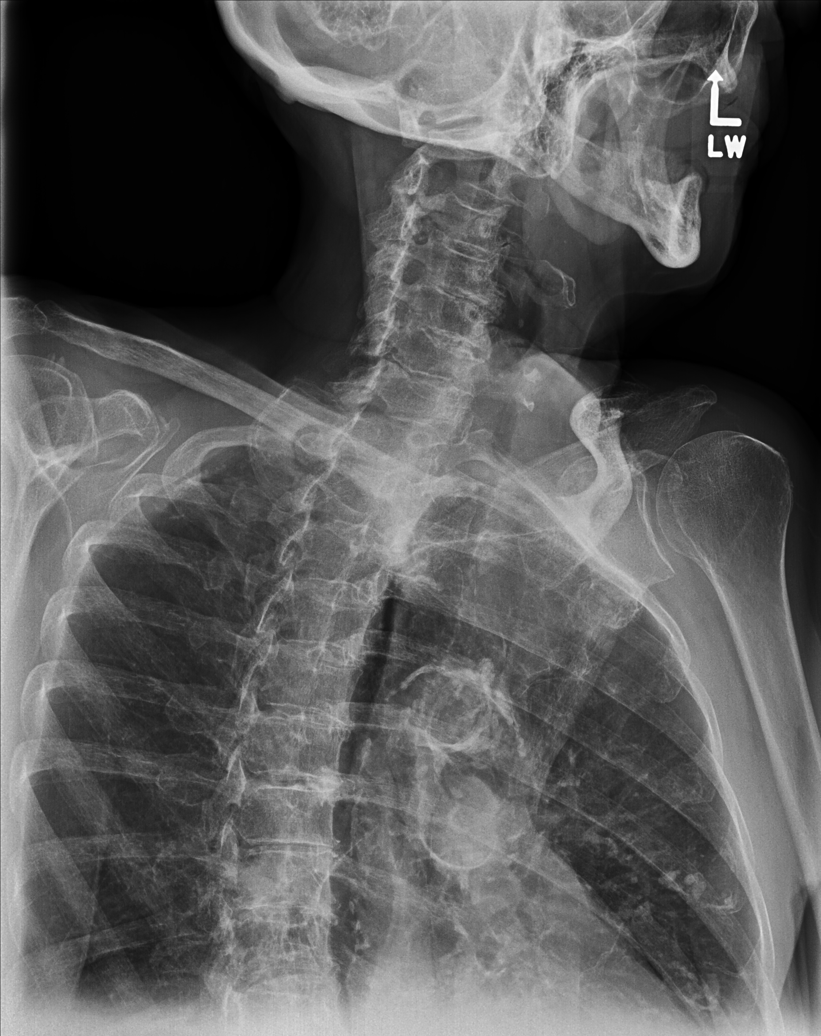

[cervical spine lat]
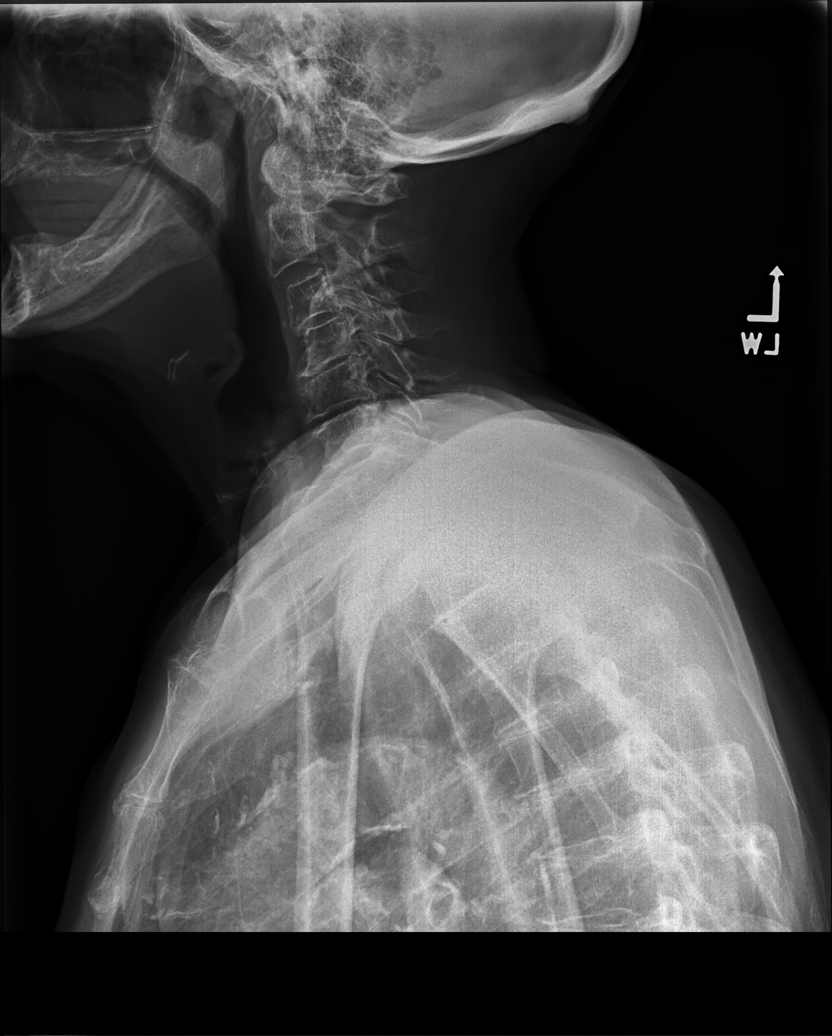

[swimmers lat]
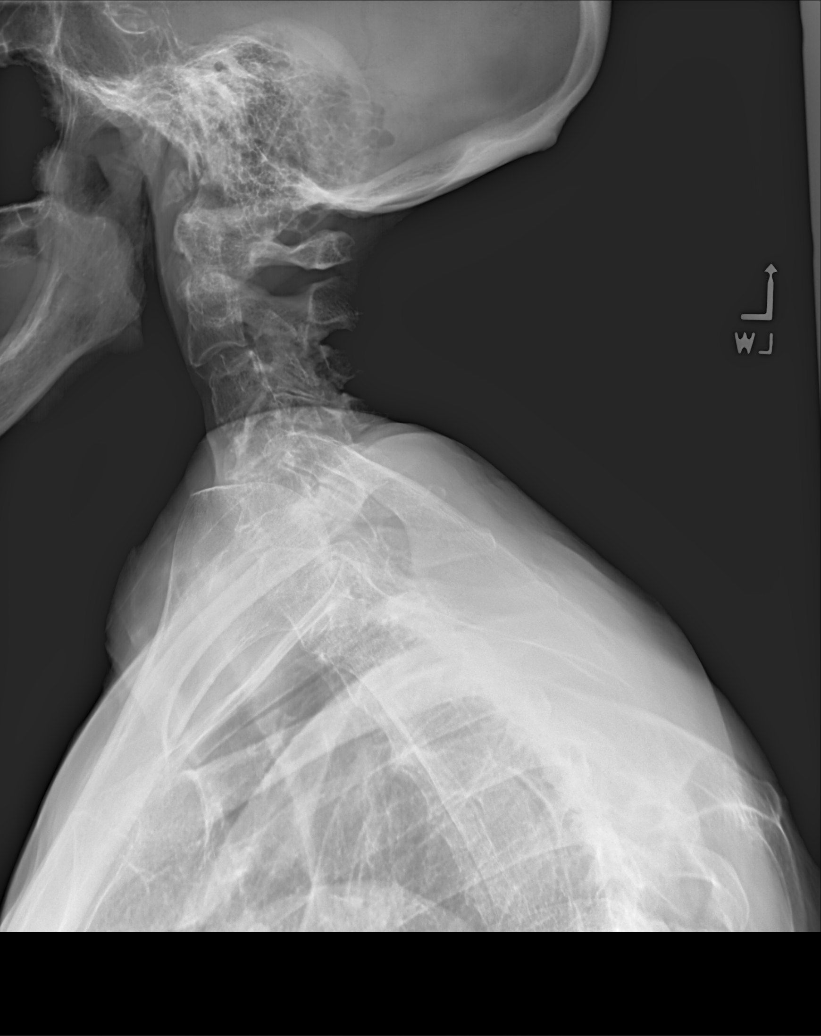

[cervical spine open mouth ap]
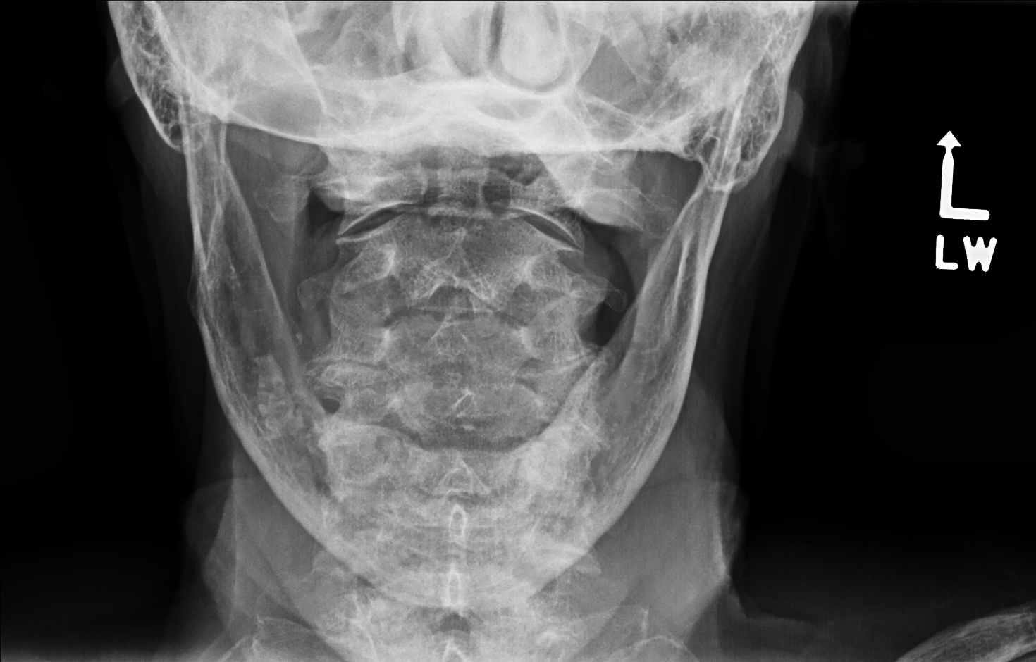

[6 of 6 positions shown; findings below may reference images not displayed]

FINDINGS: There is straightening of normal cervical lordosis. Spinal alignment
is normal. There is no evidence for fracture. Bones are mildly
osteopenic. There is moderate disc space narrowing at C3-C4 C4-C5.
There is mild disc narrowing at C5-C6 and C6-C7. No prevertebral
soft tissue swelling.
IMPRESSION: 1. No fracture or malalignment.
2. Mild to moderate degenerative changes.

## 2021-06-15 MED ORDER — GADOBUTROL 1 MMOL/ML IV SOLN
5.0000 mL | Freq: Once | INTRAVENOUS | Status: AC | PRN
  Administered 2021-06-15: 5 mL via INTRAVENOUS

## 2021-06-15 MED ORDER — TRAZODONE HCL 50 MG PO TABS: 25.0000 mg | ORAL_TABLET | Freq: Every day | ORAL | 3 refills | Status: DC

## 2021-06-15 NOTE — Patient Instructions (Addendum)
Referral to neurology ?Let us know if you dont hear back within a week in regards to an appointment being scheduled.  ? ?Stat MRI brain as scheduled ? ?I have refilled her trazodone ( to help with sleep) ? ?If you experience severe headache or  facial drooping, I want you to go to nearest emergency room or call 911 immediately as you are at high risk for another transient ischemic attack or stroke. ? ?Stroke Prevention ?Some medical conditions and behaviors can lead to a higher chance of having a stroke. You can help prevent a stroke by eating healthy, exercising, not smoking, and managing any medical conditions you have. ?Stroke is a leading cause of functional impairment. Primary prevention is particularly important because a majority of strokes are first-time events. Stroke changes the lives of not only those who experience a stroke but also their family and other caregivers. ?How can this condition affect me? ?A stroke is a medical emergency and should be treated right away. A stroke can lead to brain damage and can sometimes be life-threatening. If a person gets medical treatment right away, there is a better chance of surviving and recovering from a stroke. ?What can increase my risk? ?The following medical conditions may increase your risk of a stroke: ?Cardiovascular disease. ?High blood pressure (hypertension). ?Diabetes. ?High cholesterol. ?Sickle cell disease. ?Blood clotting disorders (hypercoagulable state). ?Obesity. ?Sleep disorders (obstructive sleep apnea). ?Other risk factors include: ?Being older than age 34. ?Having a history of blood clots, stroke, or mini-stroke (transient ischemic attack, TIA). ?Genetic factors, such as race, ethnicity, or a family history of stroke. ?Smoking cigarettes or using other tobacco products. ?Taking birth control pills, especially if you also use tobacco. ?Heavy use of alcohol or drugs, especially cocaine and methamphetamine. ?Physical inactivity. ?What actions can  I take to prevent this? ?Manage your health conditions ?High cholesterol levels. ?Eating a healthy diet is important for preventing high cholesterol. If cholesterol cannot be managed through diet alone, you may need to take medicines. ?Take any prescribed medicines to control your cholesterol as told by your health care provider. ?Hypertension. ?To reduce your risk of stroke, try to keep your blood pressure below 130/80. ?Eating a healthy diet and exercising regularly are important for controlling blood pressure. If these steps are not enough to manage your blood pressure, you may need to take medicines. ?Take any prescribed medicines to control hypertension as told by your health care provider. ?Ask your health care provider if you should monitor your blood pressure at home. ?Have your blood pressure checked every year, even if your blood pressure is normal. Blood pressure increases with age and some medical conditions. ?Diabetes. ?Eating a healthy diet and exercising regularly are important parts of managing your blood sugar (glucose). If your blood sugar cannot be managed through diet and exercise, you may need to take medicines. ?Take any prescribed medicines to control your diabetes as told by your health care provider. ?Get evaluated for obstructive sleep apnea. Talk to your health care provider about getting a sleep evaluation if you snore a lot or have excessive sleepiness. ?Make sure that any other medical conditions you have, such as atrial fibrillation or atherosclerosis, are managed. ?Nutrition ?Follow instructions from your health care provider about what to eat or drink to help manage your health condition. These instructions may include: ?Reducing your daily calorie intake. ?Limiting how much salt (sodium) you use to 1,500 milligrams (mg) each day. ?Using only healthy fats for cooking, such as olive oil, canola  oil, or sunflower oil. ?Eating healthy foods. You can do this by: ?Choosing foods that are  high in fiber, such as whole grains, and fresh fruits and vegetables. ?Eating at least 5 servings of fruits and vegetables a day. Try to fill one-half of your plate with fruits and vegetables at each meal. ?Choosing lean protein foods, such as lean cuts of meat, poultry without skin, fish, tofu, beans, and nuts. ?Eating low-fat dairy products. ?Avoiding foods that are high in sodium. This can help lower blood pressure. ?Avoiding foods that have saturated fat, trans fat, and cholesterol. This can help prevent high cholesterol. ?Avoiding processed and prepared foods. ?Counting your daily carbohydrate intake. ? ?Lifestyle ?If you drink alcohol: ?Limit how much you have to: ?0-1 drink a day for women who are not pregnant. ?0-2 drinks a day for men. ?Know how much alcohol is in your drink. In the [place] one drink equals one 12 oz bottle of beer (385m), one 5 oz glass of wine (1454m, or one 1? oz glass of hard liquor (4415m ?Do not use any products that contain nicotine or tobacco. These products include cigarettes, chewing tobacco, and vaping devices, such as e-cigarettes. If you need help quitting, ask your health care provider. ?Avoid secondhand smoke. ?Do not use drugs. ?Activity ? ?Try to stay at a healthy weight. ?Get at least 30 minutes of exercise on most days, such as: ?Fast walking. ?Biking. ?Swimming. ?Medicines ?Take over-the-counter and prescription medicines only as told by your health care provider. Aspirin or blood thinners (antiplatelets or anticoagulants) may be recommended to reduce your risk of forming blood clots that can lead to stroke. ?Avoid taking birth control pills. Talk to your health care provider about the risks of taking birth control pills if: ?You are over 35 68ars old. ?You smoke. ?You get very bad headaches. ?You have had a blood clot. ?Where to find more information ?American Stroke Association: www.strokeassociation.org ?Get help right away if: ?You or a loved one has any symptoms of  a stroke. "BE FAST" is an easy way to remember the main warning signs of a stroke: ?B - Balance. Signs are dizziness, sudden trouble walking, or loss of balance. ?E - Eyes. Signs are trouble seeing or a sudden change in vision. ?F - Face. Signs are sudden weakness or numbness of the face, or the face or eyelid drooping on one side. ?A - Arms. Signs are weakness or numbness in an arm. This happens suddenly and usually on one side of the body. ?S - Speech. Signs are sudden trouble speaking, slurred speech, or trouble understanding what people say. ?T - Time. Time to call emergency services. Write down what time symptoms started. ?You or a loved one has other signs of a stroke, such as: ?A sudden, severe headache with no known cause. ?Nausea or vomiting. ?Seizure. ?These symptoms may represent a serious problem that is an emergency. Do not wait to see if the symptoms will go away. Get medical help right away. Call your local emergency services (911 in the [place] Do not drive yourself to the hospital. ?Summary ?You can help to prevent a stroke by eating healthy, exercising, not smoking, limiting alcohol intake, and managing any medical conditions you may have. ?Do not use any products that contain nicotine or tobacco. These include cigarettes, chewing tobacco, and vaping devices, such as e-cigarettes. If you need help quitting, ask your health care provider. ?Remember "BE FAST" for warning signs of a stroke. Get help right away if you or  a loved one has any of these signs. ?This information is not intended to replace advice given to you by your health care provider. Make sure you discuss any questions you have with your health care provider. ?Document Revised: 09/29/2019 Document Reviewed: 09/29/2019 ?Elsevier Patient Education ? 2022 [place]. ? ? ? ? ?

## 2021-06-15 NOTE — Progress Notes (Signed)
? ?Subjective:  ? ? Patient ID: Natalie [name], female    DOB: 06/07/1929, 88 y.o.   MRN: [id number] ? ?CC: Natalie [name] is a 88 y.o. female who presents today for an acute visit.   ? ?HPI: Daughter [name] who is primary historian; Patient lives with daugher ? ?Feels well today.  ? ?4 days ago reports 'severe' posterior HA which has been going on a couple of days.  HA lasted for 15 minutes. HA has since resolved. She will have posterior neck pain at night.  ?No associated vision changes , numbness , cp , sob, dizziness.  ?  ?Daughter descries that she was 'staggering' and right lip was drooped for less than one minute. She sat her mother in a chair and it resolved. Answering questions appropriately when EMS arrived.  ? ?Daughter shares concern for memory. Describes that her mother doest remember words such as car keys ? ?She has no concern for infection at this time.  Denies dysuria, urinary frequency, cough, congestion ? ?She reports when she was born, forceps were used and she has posterior 'hole' in her head. She questions if reason for HA.  ? ? EMS came to house 4 days ago when daughter called concerned for TIA , HA, droopy mouth. ?Being evaluated in emergency room.  Symptoms had resolved prior to EMS arriving ? ?Inclusion cyst right side of posterior head, removed 05/18/21 by Dr Nicole Kindred ? ?H/o TIA in 2002 h/o  bilateral carotid stenosis ? ?She is compliant with aspirin 81 mg, Lipitor 40 mg. ? ?HTN-compliant with lisinopril 5 mg, metoprolol tartrate 50 mg, amlodipine 10 mg ? ? ?MRI brain 11/16/2020 significant for severe chronic small vessel ischemic disease, no acute intracranial abnormality ? ?She reports that trazodone works well for her.  She would like a refill ?Bilateral carotid artery 07/15/2020 showed nonhemodynamically significant plaque.  Stenosis 1 to 49% bilaterally ? ?HISTORY:  ?Past Medical History:  ?Diagnosis Date  ? Allergies   ? hay fever  ? Arthritis   ? Breast cancer, left ([place]) 2015  ? Left  total mastectomy  ? COPD (chronic obstructive pulmonary disease) ([place])   ? GERD (gastroesophageal reflux disease)   ? Hyperlipemia   ? Hypertension   ? Stroke [facility]) 2002  ? TIA  ? ?Past Surgical History:  ?Procedure Laterality Date  ? BREAST BIOPSY  2014  ? BREAST LUMPECTOMY  2014  ? left side  ? ENDARTERECTOMY Left 02/28/2019  ? Procedure: ENDARTERECTOMY CAROTID;  Surgeon: Katha Cabal, MD;  Location: [facility] ORS;  Service: Vascular;  Laterality: Left;  ? EVACUATION OF CERVICAL HEMATOMA Left 02/28/2019  ? Procedure: EVACUATION OF HEMATOMA;  Surgeon: Katha Cabal, MD;  Location: [facility] ORS;  Service: Vascular;  Laterality: Left;  ? EYE SURGERY Bilateral   ? cataract extractions  ? JOINT REPLACEMENT    ? MASTECTOMY Left May 9th 2017  ? RHINOPLASTY  1956  ? TONSILLECTOMY  1939  ? TOTAL HIP ARTHROPLASTY Right   ? head of the femur replaced due to fracture  ? ?Family History  ?Problem Relation Age of Onset  ? Breast cancer Mother   ? Lung cancer Father   ? ? ?Allergies: Influenza vaccines ?Current Outpatient Medications on File Prior to Visit  ?Medication Sig Dispense Refill  ? amLODipine (NORVASC) 10 MG tablet TAKE 1 TABLET BY MOUTH EVERY DAY 90 tablet 1  ? aspirin EC 81 MG tablet Take 81 mg by mouth daily. In preparation for surgery 12/18/ After stopping  plavix    ? atorvastatin (LIPITOR) 40 MG tablet TAKE 1 TABLET BY MOUTH EVERY DAY 90 tablet 3  ? Calcium Citrate-Vitamin D 250-200 MG-UNIT TABS Take 250 mg by mouth every morning.    ? cholecalciferol (VITAMIN D3) 25 MCG (1000 UT) tablet Take 1,000 Units by mouth daily.     ? fluticasone (FLONASE) 50 MCG/ACT nasal spray Place 2 sprays into both nostrils daily.    ? lisinopril (ZESTRIL) 5 MG tablet TAKE 1 TABLET BY MOUTH EVERY DAY 90 tablet 0  ? metoprolol tartrate (LOPRESSOR) 50 MG tablet TAKE 1/2 TABLET BY MOUTH TWICE A DAY 90 tablet 0  ? Tiotropium Bromide Monohydrate 2.5 MCG/ACT AERS Inhale 2 puffs into the lungs daily. (Patient not taking: Reported on  06/15/2021) 4 g 3  ? ?No current facility-administered medications on file prior to visit.  ? ? ?Social History  ? ?Tobacco Use  ? Smoking status: Former  ? Smokeless tobacco: Never  ? Tobacco comments:  ?  20 years ago  ?Vaping Use  ? Vaping Use: Never used  ?Substance Use Topics  ? Alcohol use: Yes  ?  Comment: occassionally  ? Drug use: Never  ? ? ?Review of Systems  ?Constitutional:  Negative for chills and fever.  ?HENT:  Positive for hearing loss (chronic).   ?Eyes:  Negative for visual disturbance.  ?Respiratory:  Negative for cough.   ?Cardiovascular:  Negative for chest pain and palpitations.  ?Gastrointestinal:  Negative for nausea and vomiting.  ?Neurological:  Negative for speech difficulty and headaches (resolved).  ?Psychiatric/Behavioral:  Negative for sleep disturbance.   ?   ?Objective:  ?  ?BP 118/82 (BP Location: Left Arm, Patient Position: Sitting, Cuff Size: Small)   Pulse 72   Temp 97.6 ?F (36.4 ?C) (Oral)   Ht '5\' 5"'$  (1.651 m)   Wt 123 lb (55.8 kg)   SpO2 96%   BMI 20.47 kg/m?  ? ? ?Physical Exam ?Vitals reviewed.  ?Constitutional:   ?   Appearance: She is well-developed.  ?HENT:  ?   Head:  ? ?   Comments: Scab noted right posterior occipital from his cyst removal.  Well-healing.  No increased heat, significant for severe chronic small vessel ischemic disease.  No acute intracranial abnormality erythema, tenderness with palpation, edema or red streaks ?   Mouth/Throat:  ?   Pharynx: Uvula midline.  ?Eyes:  ?   Conjunctiva/sclera: Conjunctivae normal.  ?   Pupils: Pupils are equal, round, and reactive to light.  ?   Comments: Fundus normal bilaterally.   ?Neck:  ? ?   Comments: Indentation noted ( reported from birth) left occipital area, nontender  ?Cardiovascular:  ?   Rate and Rhythm: Normal rate and regular rhythm.  ?   Pulses: Normal pulses.  ?   Heart sounds: Normal heart sounds.  ?Pulmonary:  ?   Effort: Pulmonary effort is normal.  ?   Breath sounds: Normal breath sounds. No  wheezing, rhonchi or rales.  ?Musculoskeletal:  ?   Cervical back: Full passive range of motion without pain and normal range of motion. No rigidity. No pain with movement, spinous process tenderness or muscular tenderness. Normal range of motion.  ?Skin: ?   General: Skin is warm and dry.  ?Neurological:  ?   Mental Status: She is alert.  ?   Cranial Nerves: No cranial nerve deficit.  ?   Sensory: No sensory deficit.  ?   Deep Tendon Reflexes:  ?   Reflex  Scores: ?     Bicep reflexes are 2+ on the right side and 2+ on the left side. ?     Patellar reflexes are 2+ on the right side and 2+ on the left side. ?   Comments: Grip equal and strong bilateral upper extremities. Gait strong and steady. Able to perform rapid alternating movement without difficulty.   ?Psychiatric:     ?   Speech: Speech normal.     ?   Behavior: Behavior normal.     ?   Thought Content: Thought content normal.  ? ? ?   ?Assessment & Plan:  ? ?Problem List Items Addressed This Visit   ? ?  ? Other  ? Depression, major, single episode, mild ([place])  ? Relevant Medications  ? traZODone (DESYREL) 50 MG tablet  ? Encounter for medical examination to establish care  ? Relevant Medications  ? traZODone (DESYREL) 50 MG tablet  ? Headache - Primary  ?  Headache has completely resolved.  Normal neurologic exam.  Discussed my grave concern as patient has a history of TIA, known bilateral carotid stenosis and 4 days ago daughter witnessed right drooping side of lip associated with severe headache.  This was self-limiting, brief with no recurrence.  Patient adamantly declines being evaluated in the emergency room today.  I have agreed to order stat MRI of the brain.  Referral has been placed to neurology for further evaluation of memory changes and as concerned for vascular dementia with known history of cerebral vascular disease.  Counseled on the importance if symptoms were to recur, to go to the emergency room to be evaluated as patient remains at high  risk for repeat TIA, CVA.  Pending x-ray cervical spine as concern also for cervicogenic HA.  ?  ?  ? Relevant Medications  ? traZODone (DESYREL) 50 MG tablet  ? Other Relevant Orders  ? MR BRAIN W WO CONTRAST  ? Ambulat

## 2021-06-15 NOTE — Assessment & Plan Note (Addendum)
Headache has completely resolved.  Normal neurologic exam.  Discussed my grave concern as patient has a history of TIA, known bilateral carotid stenosis and 4 days ago daughter witnessed right drooping side of lip associated with severe headache.  This was self-limiting, brief with no recurrence.  Patient adamantly declines being evaluated in the emergency room today.  I have agreed to order stat MRI of the brain.  Referral has been placed to neurology for further evaluation of memory changes and as concerned for vascular dementia with known history of cerebral vascular disease.  Counseled on the importance if symptoms were to recur, to go to the emergency room to be evaluated as patient remains at high risk for repeat TIA, CVA.  Pending x-ray cervical spine as concern also for cervicogenic HA.  ?

## 2021-06-15 NOTE — Telephone Encounter (Signed)
Did she go the hospital for this TIA? Because a Tia can be caused by more than carotid stenosis.  She needs to worked up immediately after the symptoms occur and I don't see that there any records from any hospital. How was this TIA diagnosed?  As far as vascular dementia, there is not fix for that, that is progressive issue generally managed by neurology.

## 2021-06-15 NOTE — Telephone Encounter (Signed)
I spoke with patient daughter and she stated that her mother is scheduled to have MRI today. Patient daughter stated that her mother PCP will sending a referral to neurology. Patient daughter did contact 30 but her mother refuse to go to the ER. Patient will keep appointment as scheduled ?

## 2021-06-16 LAB — RPR: RPR Ser Ql: NONREACTIVE

## 2021-06-21 ENCOUNTER — Encounter: Payer: Self-pay | Admitting: Family

## 2021-06-21 ENCOUNTER — Telehealth: Payer: Self-pay

## 2021-06-21 NOTE — Telephone Encounter (Signed)
LMTCB office to get results ?

## 2021-06-22 ENCOUNTER — Other Ambulatory Visit: Payer: Self-pay | Admitting: Family

## 2021-06-22 DIAGNOSIS — E538 Deficiency of other specified B group vitamins: Secondary | ICD-10-CM

## 2021-06-24 ENCOUNTER — Telehealth: Payer: Self-pay

## 2021-06-24 NOTE — Telephone Encounter (Signed)
LMTCB for lab results.  

## 2021-06-27 NOTE — Telephone Encounter (Signed)
Pt returning call... Pt stated that she will be here tomorrow.. She will wait until tomorrow to get information.Marland KitchenMarland Kitchen  ?

## 2021-06-27 NOTE — Telephone Encounter (Signed)
Noted but I have already spoken with patient as well.  ?

## 2021-06-28 ENCOUNTER — Ambulatory Visit (INDEPENDENT_AMBULATORY_CARE_PROVIDER_SITE_OTHER): Payer: Medicare Other

## 2021-06-28 DIAGNOSIS — E538 Deficiency of other specified B group vitamins: Secondary | ICD-10-CM | POA: Diagnosis not present

## 2021-06-28 MED ORDER — CYANOCOBALAMIN 1000 MCG/ML IJ SOLN
1000.0000 ug | Freq: Once | INTRAMUSCULAR | Status: AC
  Administered 2021-06-28: 1000 ug via INTRAMUSCULAR

## 2021-06-28 MED ORDER — CYANOCOBALAMIN 1000 MCG/ML IJ SOLN: 1000.0000 ug | Freq: Once | INTRAMUSCULAR | Status: DC

## 2021-06-28 NOTE — Progress Notes (Signed)
Patient presented for B 12 injection to left deltoid, patient voiced no concerns nor showed any signs of distress during injection. 

## 2021-06-28 NOTE — Progress Notes (Deleted)
Patient presented for B 12 injection to left deltoid, patient voiced no concerns nor showed any signs of distress during injection. 

## 2021-06-29 ENCOUNTER — Other Ambulatory Visit: Payer: Self-pay | Admitting: Family

## 2021-06-29 DIAGNOSIS — M542 Cervicalgia: Secondary | ICD-10-CM

## 2021-07-05 ENCOUNTER — Other Ambulatory Visit: Payer: Self-pay | Admitting: Family

## 2021-07-05 ENCOUNTER — Ambulatory Visit (INDEPENDENT_AMBULATORY_CARE_PROVIDER_SITE_OTHER): Payer: Medicare Other

## 2021-07-05 DIAGNOSIS — E538 Deficiency of other specified B group vitamins: Secondary | ICD-10-CM | POA: Diagnosis not present

## 2021-07-05 MED ORDER — CYANOCOBALAMIN 1000 MCG/ML IJ SOLN
1000.0000 ug | Freq: Once | INTRAMUSCULAR | Status: AC
  Administered 2021-07-05: 1000 ug via INTRAMUSCULAR

## 2021-07-05 NOTE — Progress Notes (Signed)
Patient presented for B 12 injection to right deltoid, patient voiced no concerns nor showed any signs of distress during injection. 

## 2021-07-06 ENCOUNTER — Encounter: Payer: Self-pay | Admitting: Physician Assistant

## 2021-07-06 ENCOUNTER — Telehealth: Payer: Self-pay | Admitting: Family

## 2021-07-06 ENCOUNTER — Ambulatory Visit: Payer: Medicare Other | Admitting: Physician Assistant

## 2021-07-06 VITALS — BP 129/61 | HR 79 | Resp 20 | Ht 65.0 in | Wt 127.0 lb

## 2021-07-06 DIAGNOSIS — F0150 Vascular dementia without behavioral disturbance: Secondary | ICD-10-CM | POA: Diagnosis not present

## 2021-07-06 DIAGNOSIS — Z8673 Personal history of transient ischemic attack (TIA), and cerebral infarction without residual deficits: Secondary | ICD-10-CM | POA: Diagnosis not present

## 2021-07-06 DIAGNOSIS — R413 Other amnesia: Secondary | ICD-10-CM

## 2021-07-06 NOTE — Telephone Encounter (Signed)
FYI, needing ofc notes to support the need for pt to have MRI cervical spine. What are you looking for ? How long has pt had this? Any treatments? Any PT? Please advise before it denies. Thank you!  ?

## 2021-07-06 NOTE — Patient Instructions (Signed)
It was a pleasure to see you today at our office.  ? ?Recommendations: ? ?Follow up in 1 months ?Check echo ?PT for balance and strength ? ?RECOMMENDATIONS FOR ALL PATIENTS WITH MEMORY PROBLEMS: ?1. Continue to exercise (Recommend 30 minutes of walking everyday, or 3 hours every week) ?2. Increase social interactions - continue going to [place] and enjoy social gatherings with friends and family ?3. Eat healthy, avoid fried foods and eat more fruits and vegetables ?4. Maintain adequate blood pressure, blood sugar, and blood cholesterol level. Reducing the risk of stroke and cardiovascular disease also helps promoting better memory. ?5. Avoid stressful situations. Live a simple life and avoid aggravations. Organize your time and prepare for the next day in anticipation. ?6. Sleep well, avoid any interruptions of sleep and avoid any distractions in the bedroom that may interfere with adequate sleep quality ?7. Avoid sugar, avoid sweets as there is a strong link between excessive sugar intake, diabetes, and cognitive impairment ?We discussed the Mediterranean diet, which has been shown to help patients reduce the risk of progressive memory disorders and reduces cardiovascular risk. This includes eating fish, eat fruits and green leafy vegetables, nuts like almonds and hazelnuts, walnuts, and also use olive oil. Avoid fast foods and fried foods as much as possible. Avoid sweets and sugar as sugar use has been linked to worsening of memory function. ? ?There is always a concern of gradual progression of memory problems. If this is the case, then we may need to adjust level of care according to patient needs. Support, both to the patient and caregiver, should then be put into place.  ? ? ? ? ?You have been referred for a neuropsychological evaluation (i.e., evaluation of memory and thinking abilities). Please bring someone with you to this appointment if possible, as it is helpful for the doctor to hear from both you and  another adult who knows you well. Please bring eyeglasses and hearing aids if you wear them.  ?  ?The evaluation will take approximately 3 hours and has two parts: ?  ?The first part is a clinical interview with the neuropsychologist (Dr. Melvyn Novas or Dr. Nicole Kindred). During the interview, the neuropsychologist will speak with you and the individual you brought to the appointment.  ?  ?The second part of the evaluation is testing with the doctor's technician Hinton Dyer or Maudie Mercury). During the testing, the technician will ask you to remember different types of material, solve problems, and answer some questionnaires. Your family member will not be present for this portion of the evaluation. ?  ?Please note: We must reserve several hours of the neuropsychologist's time and the psychometrician's time for your evaluation appointment. As such, there is a No-Show fee of $100. If you are unable to attend any of your appointments, please contact our office as soon as possible to reschedule.  ? ? ?FALL PRECAUTIONS: Be cautious when walking. Scan the area for obstacles that may increase the risk of trips and falls. When getting up in the mornings, sit up at the edge of the bed for a few minutes before getting out of bed. Consider elevating the bed at the head end to avoid drop of blood pressure when getting up. Walk always in a well-lit room (use night lights in the walls). Avoid area rugs or power cords from appliances in the middle of the walkways. Use a walker or a cane if necessary and consider physical therapy for balance exercise. Get your eyesight checked regularly. ? ?FINANCIAL OVERSIGHT: Supervision,  especially oversight when making financial decisions or transactions is also recommended. ? ?HOME SAFETY: Consider the safety of the kitchen when operating appliances like stoves, microwave oven, and blender. Consider having supervision and share cooking responsibilities until no longer able to participate in those. Accidents with  firearms and other hazards in the house should be identified and addressed as well. ? ? ?ABILITY TO BE LEFT ALONE: If patient is unable to contact 911 operator, consider using LifeLine, or when the need is there, arrange for someone to stay with patients. Smoking is a fire hazard, consider supervision or cessation. Risk of wandering should be assessed by caregiver and if detected at any point, supervision and safe proof recommendations should be instituted. ? ?MEDICATION SUPERVISION: Inability to self-administer medication needs to be constantly addressed. Implement a mechanism to ensure safe administration of the medications. ? ? ?DRIVING: Regarding driving, in patients with progressive memory problems, driving will be impaired. We advise to have someone else do the driving if trouble finding directions or if minor accidents are reported. Independent driving assessment is available to determine safety of driving. ? ? ?If you are interested in the driving assessment, you can contact the following: ? ?[employer] in [place] ? ?[place] [phone] ? ?[facility] [phone] ? ?[facility] [phone] or [phone] ? ? ? ?Mediterranean Diet ?A Mediterranean diet refers to food and lifestyle choices that are based on the traditions of countries located on the [employer]. This way of eating has been shown to help prevent certain conditions and improve outcomes for people who have chronic diseases, like kidney disease and heart disease. ?What are tips for following this plan? ?Lifestyle  ?Cook and eat meals together with your family, when possible. ?Drink enough fluid to keep your urine clear or pale yellow. ?Be physically active every day. This includes: ?Aerobic exercise like running or swimming. ?Leisure activities like gardening, walking, or housework. ?Get 7-8 hours of sleep each night. ?If recommended by your health care provider, drink  red wine in moderation. This means 1 glass a day for nonpregnant women and 2 glasses a day for men. A glass of wine equals 5 oz (150 mL). ?Reading food labels  ?Check the serving size of packaged foods. For foods such as rice and pasta, the serving size refers to the amount of cooked product, not dry. ?Check the total fat in packaged foods. Avoid foods that have saturated fat or trans fats. ?Check the ingredients list for added sugars, such as corn syrup. ?Shopping  ?At the grocery store, buy most of your food from the areas near the walls of the store. This includes: ?Fresh fruits and vegetables (produce). ?Grains, beans, nuts, and seeds. Some of these may be available in unpackaged forms or large amounts (in bulk). ?Fresh seafood. ?Poultry and eggs. ?Low-fat dairy products. ?Buy whole ingredients instead of prepackaged foods. ?Buy fresh fruits and vegetables in-season from local farmers markets. ?Buy frozen fruits and vegetables in resealable bags. ?If you do not have access to quality fresh seafood, buy precooked frozen shrimp or canned fish, such as tuna, salmon, or sardines. ?Buy small amounts of raw or cooked vegetables, salads, or olives from the deli or salad bar at your store. ?Stock your pantry so you always have certain foods on hand, such as olive oil, canned tuna, canned tomatoes, rice, pasta, and beans. ?Cooking  ?Cook foods with extra-virgin olive oil instead of using butter or other vegetable oils. ?Have meat as a side  dish, and have vegetables or grains as your main dish. This means having meat in small portions or adding small amounts of meat to foods like pasta or stew. ?Use beans or vegetables instead of meat in common dishes like chili or lasagna. ?Experiment with different cooking methods. Try roasting or broiling vegetables instead of steaming or saut?eing them. ?Add frozen vegetables to soups, stews, pasta, or rice. ?Add nuts or seeds for added healthy fat at each meal. You can add these to  yogurt, salads, or vegetable dishes. ?Marinate fish or vegetables using olive oil, lemon juice, garlic, and fresh herbs. ?Meal planning  ?Plan to eat 1 vegetarian meal one day each week. Try to work up to 2 ve

## 2021-07-06 NOTE — Progress Notes (Addendum)
T ?[facility] ?Neurology Division ?Clinic Note - Initial Visit ? ? ?Date: 07/07/21 ? ?Natalie [name] ?MRN: [id number] ?DOB: [birth year] ? ? ?Dear: Natalie Hawthorne, FNP: ? ?Thank you for your kind referral of Natalie [name] for consultation of TIA. Although her history is well known to you, please allow [name] to reiterate it for the purpose of our medical record. The patient was accompanied to the clinic by her daughter who also provides collateral information.  ?  ? ?History of Present Illness: ?Natalie [name] is a 88 y.o. R-handed female with a history of carotid artery stenosis status post CEA in 2020, hypertension, CKD, history left breast cancer status post left mastectomy, of right breast mass (declining studies), B12 deficiency receiving regular injections, history of TIA in 2002, insomnia, history of cervicogenic headache, presenting for evaluation of recent TIA.  In review, the patient had an episode of 06/13/2021 of right lip drooping associated with severe headache, self-limited, very brief, without recurrence.  EMS was called, however she adamantly declined further evaluation at the ED.  Per chart notes, vital signs were normal at the time, without one-sided weakness, or slurred speech.  She did have some unsteady balance, almost falling, when her daughter had to catch her before hitting the floor.  She may had a moment of confusion at the time, which per notes sounded like vascular dementia, but her daughter states that her memory is improved since that event.  The patient denies any vertigo, dizziness, or vision changes.  She denies dysphagia.  No loss of consciousness.  Denies seizures.  Denies any chest pain or shortness of breath, fever, chills or recent infections.  Occasionally she has night sweats.  She denies any tobacco since 2009.  No new medications or hormonal supplements.  She takes a regular aspirin a day, without any other antiplatelets or anticoagulants.  She denies any recent  long distance trips or recent surgeries.  No sick contacts.  Denies any new stressors in personal life.  She is compliant with her medications.  Patient is not very active, but fairly independent.  She uses a walker when she is outside.  She denies being a diabetic.  Family history remarkable for stroke in grandmother.  She has some memory difficulties for at least 3 years, and does repeat herself. ? ? ?Lab Results  ?Component Value Date  ? HGBA1C 6.3 (H) 03/01/2019  ? ?Lab Results  ?Component Value Date  ? [id number] 06/15/2021  ? ?Lab Results  ?Component Value Date  ? TSH 1.40 06/15/2021  ? ?No results found for: ESRSEDRATE, POCTSEDRATE ? ?Past Medical History:  ?Diagnosis Date  ? Allergies   ? hay fever  ? Arthritis   ? Breast cancer, left ([place]) 2015  ? Left total mastectomy  ? COPD (chronic obstructive pulmonary disease) ([place])   ? GERD (gastroesophageal reflux disease)   ? Hyperlipemia   ? Hypertension   ? Stroke [facility]) 2002  ? TIA  ? ? ?Past Surgical History:  ?Procedure Laterality Date  ? BREAST BIOPSY  2014  ? BREAST LUMPECTOMY  2014  ? left side  ? ENDARTERECTOMY Left 02/28/2019  ? Procedure: ENDARTERECTOMY CAROTID;  Surgeon: Katha Cabal, MD;  Location: [facility] ORS;  Service: Vascular;  Laterality: Left;  ? EVACUATION OF CERVICAL HEMATOMA Left 02/28/2019  ? Procedure: EVACUATION OF HEMATOMA;  Surgeon: Katha Cabal, MD;  Location: [facility] ORS;  Service: Vascular;  Laterality: Left;  ? EYE SURGERY Bilateral   ? cataract extractions  ?  JOINT REPLACEMENT    ? MASTECTOMY Left May 9th 2017  ? RHINOPLASTY  1956  ? TONSILLECTOMY  1939  ? TOTAL HIP ARTHROPLASTY Right   ? head of the femur replaced due to fracture  ? ? ? ?Medications:  ?Outpatient Encounter Medications as of 07/06/2021  ?Medication Sig  ? amLODipine (NORVASC) 10 MG tablet TAKE 1 TABLET BY MOUTH EVERY DAY  ? aspirin EC 81 MG tablet Take 81 mg by mouth daily. In preparation for surgery 12/18/ After stopping plavix  ? atorvastatin (LIPITOR) 40  MG tablet TAKE 1 TABLET BY MOUTH EVERY DAY  ? Calcium Citrate-Vitamin D 250-200 MG-UNIT TABS Take 250 mg by mouth every morning.  ? cholecalciferol (VITAMIN D3) 25 MCG (1000 UT) tablet Take 1,000 Units by mouth daily.   ? fluticasone (FLONASE) 50 MCG/ACT nasal spray Place 2 sprays into both nostrils daily.  ? lisinopril (ZESTRIL) 5 MG tablet TAKE 1 TABLET BY MOUTH EVERY DAY  ? metoprolol tartrate (LOPRESSOR) 50 MG tablet TAKE 1/2 TABLET BY MOUTH TWICE A DAY  ? Tiotropium Bromide Monohydrate 2.5 MCG/ACT AERS Inhale 2 puffs into the lungs daily.  ? traZODone (DESYREL) 50 MG tablet Take 0.5 tablets (25 mg total) by mouth at bedtime.  ? ?No facility-administered encounter medications on file as of 07/06/2021.  ? ? ?Allergies:  ?Allergies  ?Allergen Reactions  ? Influenza Vaccines Shortness Of Breath  ? ? ?Family History: ?Family History  ?Problem Relation Age of Onset  ? Breast cancer Mother   ? Lung cancer Father   ? ? ?Social History: ?Social History  ? ?Tobacco Use  ? Smoking status: Former  ? Smokeless tobacco: Never  ? Tobacco comments:  ?  20 years ago  ?Vaping Use  ? Vaping Use: Never used  ?Substance Use Topics  ? Alcohol use: Yes  ?  Comment: occassionally  ? Drug use: Never  ? ?Social History  ? ?Social History Narrative  ? Patient lives with her daughter  ? Right handed'  ? 2 sons  ? ? ?Vital Signs:  ?BP 129/61   Pulse 79   Resp 20   Ht '5\' 5"'$  (1.651 m)   Wt 127 lb (57.6 kg)   SpO2 94%   BMI 21.13 kg/m?  ?  ?General Medical Exam:   ?General:  Well appearing, comfortable.   ?Eyes/ENT: see cranial nerve examination.   ?Neck:   No carotid bruits. ?Respiratory:  Clear to auscultation, good air entry bilaterally.   ?Cardiac:  Regular rate and rhythm, no murmur.   ?Extremities:  No deformities, edema, or skin discoloration.  ?Skin:  No rashes or lesions. ? ?Neurological Exam: ?MENTAL STATUS including orientation to time, place, person, recent memory impaired, remote memory intact.  Normal attention and  concentration, language normal, fund of knowledge is normal.  Speech is not dysarthric.  ?CRANIAL NERVES: ?II:  No visual field defects.  Unremarkable fundi.   ?III-IV-VI: Pupils equal round and reactive to light.  Normal conjugate, extra-ocular eye movements in all directions of gaze.  No nystagmus.  No ptosis ?V:  Normal facial sensation.    ?VII:  Normal facial symmetry and movements.   ?VIII: Decreased hearing ?IX-X:  Normal palatal movement.   ?XI:  Normal shoulder shrug and head rotation.   ?XII:  Normal tongue strength and range of motion, no deviation or fasciculation. ? ?MOTOR:  No atrophy, fasciculations or abnormal movements.  No pronator drift.  ?  ?SENSORY:  Normal and symmetric perception of light touch, pinprick,  vibration, and proprioception.  Romberg's sign absent.  ? ?COORDINATION/GAIT: Normal finger-to- nose-finger and heel-to-shin.  Intact rapid alternating movements bilaterally.  Unable to rise from a chair without using arms.  Gait narrow based but somewhat unstable, unable to tandem walk, needed to hold to an object.   ? ?MRI brain 06/13/21 No acute intracranial process. No etiology is seen for the patient's headache or left facial droop. ?2. Degenerative changes in the partially imaged cervical spine, concerning for moderate spinal canal stenosis at C3-C4. This could be further evaluated with a noncontrast MRI if clinically indicated. ? ? ?  07/07/2021  ?  7:00 AM  ?Montreal Cognitive Assessment   ?Visuospatial/ Executive (0/5) 0  ?Naming (0/3) 2  ?Attention: Read list of digits (0/2) 2  ?Attention: Read list of letters (0/1) 1  ?Attention: Serial 7 subtraction starting at 100 (0/3) 1  ?Language: Repeat phrase (0/2) 1  ?Language : Fluency (0/1) 0  ?Abstraction (0/2) 0  ?Delayed Recall (0/5) 2  ?Orientation (0/6) 4  ?Total 13  ?Adjusted Score (based on education) 13  ?  ?IMPRESSION/PLAN: ? ? TIA in the form of left  lip droop, lasting about 1 minute, with complete resolution.  She has a prior  history of TIA, likely due to carotid artery stenosis, status post left CEA in 2020.  MRI of the brain on 06/15/21  negative for acute findings, without significant explanation for her facial droop.  No facial droop is

## 2021-07-07 ENCOUNTER — Other Ambulatory Visit: Payer: Self-pay

## 2021-07-07 DIAGNOSIS — G459 Transient cerebral ischemic attack, unspecified: Secondary | ICD-10-CM

## 2021-07-07 NOTE — Telephone Encounter (Signed)
Pt is scheduled for tomorrow. Please advise from previous msg. Thank you! ?

## 2021-07-08 ENCOUNTER — Telehealth: Payer: Self-pay

## 2021-07-08 ENCOUNTER — Other Ambulatory Visit: Payer: Self-pay | Admitting: Family

## 2021-07-08 ENCOUNTER — Ambulatory Visit: Payer: Medicare Other

## 2021-07-08 DIAGNOSIS — I10 Essential (primary) hypertension: Secondary | ICD-10-CM

## 2021-07-08 NOTE — Telephone Encounter (Signed)
[facility] pt insurance has denied MRI cervical ?They are looking for trial of PT or medication ? ?Does she want to trial PT for neck pain before scheduled MRI? ? ? ?

## 2021-07-08 NOTE — Telephone Encounter (Signed)
She has had neck pain ?I ordered xray cervical spine 06/15/21 which showed: There is mild disc narrowing at C5-C6 and C6-C7. ?Mri brain 06/15/21 showed Degenerative changes in the cervical spine ?

## 2021-07-08 NOTE — Telephone Encounter (Signed)
[place] office ?

## 2021-07-08 NOTE — Telephone Encounter (Signed)
[place] office ?

## 2021-07-11 ENCOUNTER — Encounter (INDEPENDENT_AMBULATORY_CARE_PROVIDER_SITE_OTHER): Payer: Medicare Other

## 2021-07-11 ENCOUNTER — Ambulatory Visit (INDEPENDENT_AMBULATORY_CARE_PROVIDER_SITE_OTHER): Payer: Medicare Other | Admitting: Nurse Practitioner

## 2021-07-11 ENCOUNTER — Telehealth: Payer: Self-pay | Admitting: Cardiology

## 2021-07-11 NOTE — Telephone Encounter (Signed)
Have made several attempts to schedule follow up with no success ?Recall deleted ?

## 2021-07-11 NOTE — Telephone Encounter (Signed)
Noted  

## 2021-07-12 ENCOUNTER — Ambulatory Visit (INDEPENDENT_AMBULATORY_CARE_PROVIDER_SITE_OTHER): Payer: Medicare Other

## 2021-07-12 ENCOUNTER — Ambulatory Visit: Payer: Medicare Other

## 2021-07-12 DIAGNOSIS — E538 Deficiency of other specified B group vitamins: Secondary | ICD-10-CM | POA: Diagnosis not present

## 2021-07-12 MED ORDER — CYANOCOBALAMIN 1000 MCG/ML IJ SOLN
1000.0000 ug | Freq: Once | INTRAMUSCULAR | Status: AC
  Administered 2021-07-12: 1000 ug via INTRAMUSCULAR

## 2021-07-12 NOTE — Progress Notes (Signed)
Natalie [name] presents today for injection per MD orders. ?B12 injection  administered IM in right Upper Arm. ?Administration without incident. ?Patient tolerated well.  Shakeita Vandevander,cma  ? ?

## 2021-07-13 NOTE — Telephone Encounter (Signed)
Spoke to daughter [name] and she is going to talk with her mom and see what Mom wants to do and give [name] a call back. ?

## 2021-07-15 ENCOUNTER — Ambulatory Visit (HOSPITAL_COMMUNITY): Payer: Medicare Other | Attending: Cardiology

## 2021-07-15 DIAGNOSIS — G459 Transient cerebral ischemic attack, unspecified: Secondary | ICD-10-CM

## 2021-07-15 LAB — ECHOCARDIOGRAM COMPLETE
Area-P 1/2: 4.36 cm2
S' Lateral: 2.3 cm

## 2021-07-18 ENCOUNTER — Ambulatory Visit (INDEPENDENT_AMBULATORY_CARE_PROVIDER_SITE_OTHER): Payer: Medicare Other

## 2021-07-18 ENCOUNTER — Ambulatory Visit: Payer: Medicare Other

## 2021-07-18 ENCOUNTER — Encounter: Payer: Self-pay | Admitting: Family

## 2021-07-18 ENCOUNTER — Ambulatory Visit (INDEPENDENT_AMBULATORY_CARE_PROVIDER_SITE_OTHER): Payer: Medicare Other | Admitting: Family

## 2021-07-18 VITALS — BP 138/74 | HR 83 | Temp 97.7°F | Ht 65.0 in | Wt 121.8 lb

## 2021-07-18 DIAGNOSIS — I6522 Occlusion and stenosis of left carotid artery: Secondary | ICD-10-CM

## 2021-07-18 DIAGNOSIS — J40 Bronchitis, not specified as acute or chronic: Secondary | ICD-10-CM

## 2021-07-18 DIAGNOSIS — R059 Cough, unspecified: Secondary | ICD-10-CM | POA: Diagnosis not present

## 2021-07-18 DIAGNOSIS — E538 Deficiency of other specified B group vitamins: Secondary | ICD-10-CM | POA: Diagnosis not present

## 2021-07-18 DIAGNOSIS — G459 Transient cerebral ischemic attack, unspecified: Secondary | ICD-10-CM | POA: Diagnosis not present

## 2021-07-18 LAB — HEMOGLOBIN A1C: Hgb A1c MFr Bld: 6 % (ref 4.6–6.5)

## 2021-07-18 IMAGING — DX DG CHEST 2V
2 series · 2 of 2 positions shown · non-contrast
Comparison: Portable chest [DATE].

CLINICAL DATA: 89-year-old female with cough and congestion.

EXAM:
CHEST - 2 VIEW

[chest pa]
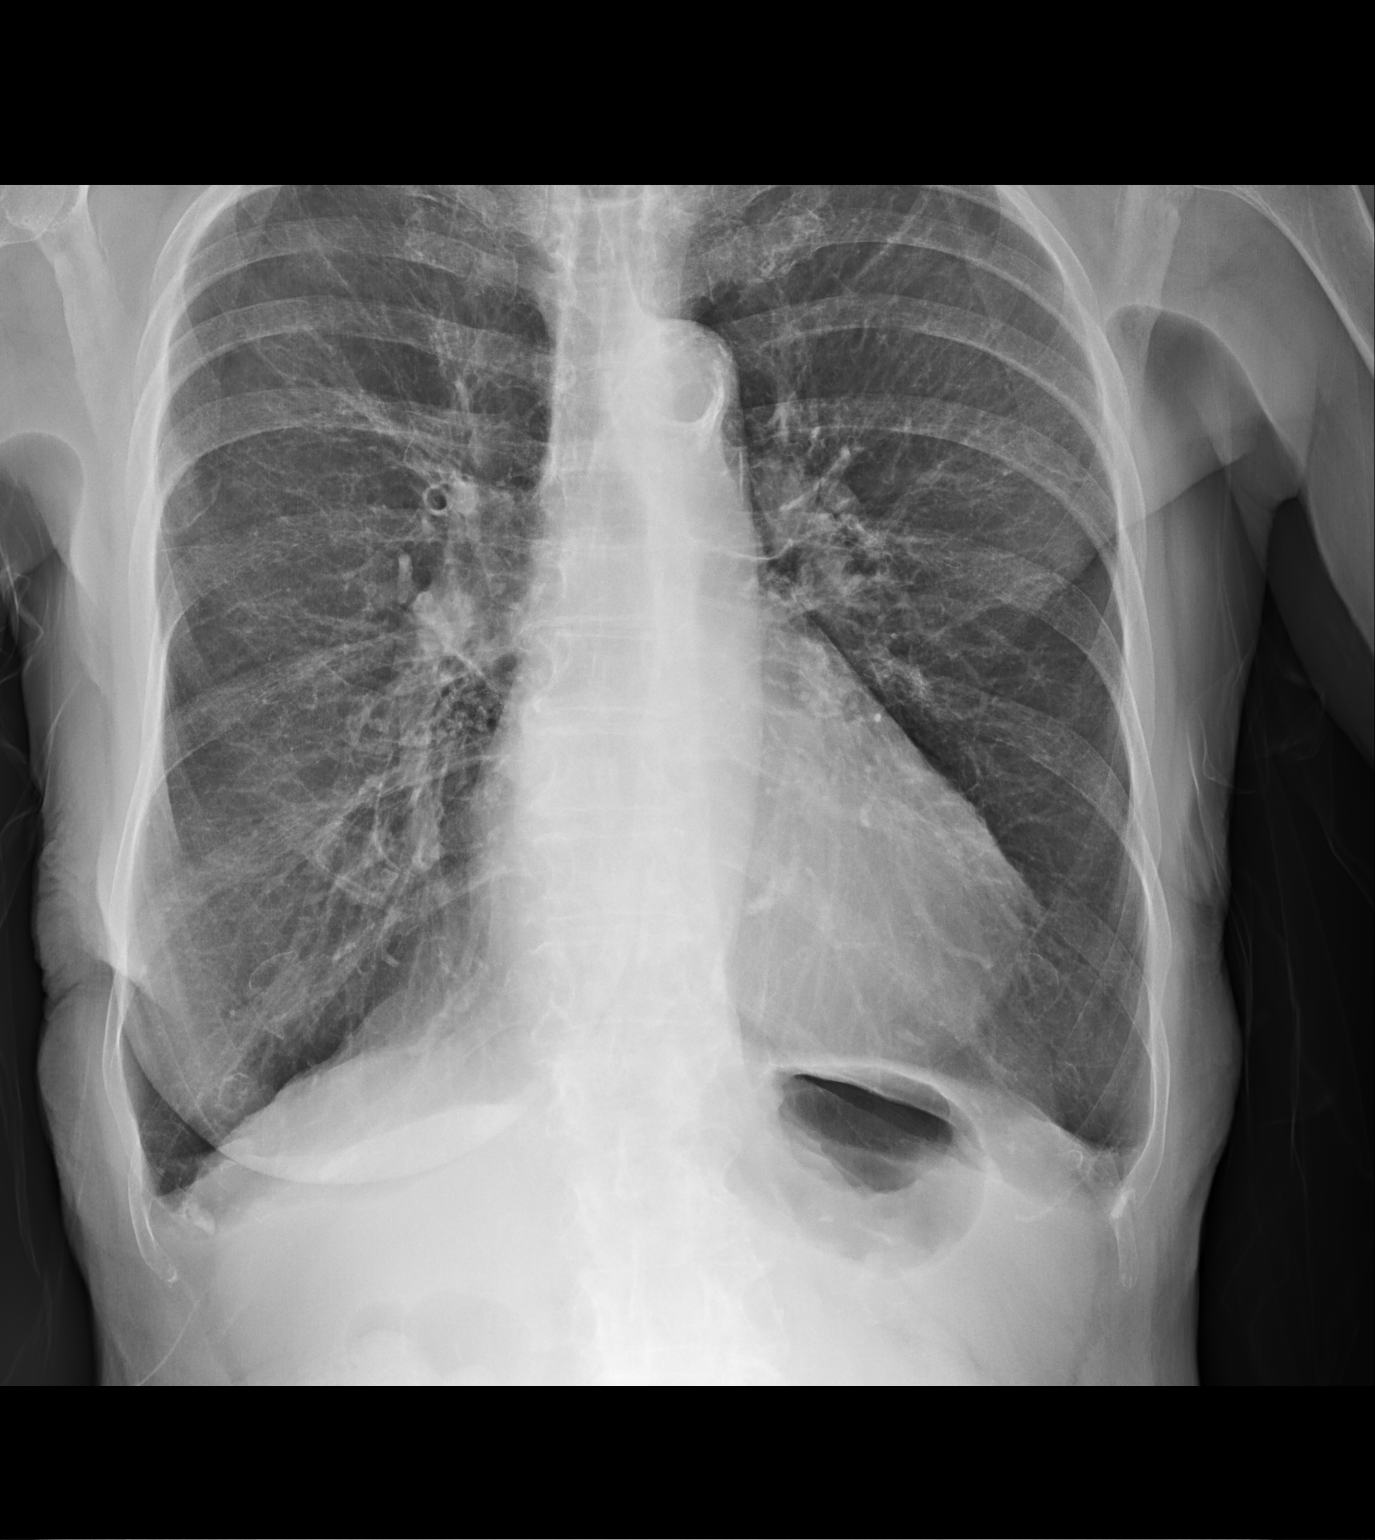

[chest lat]
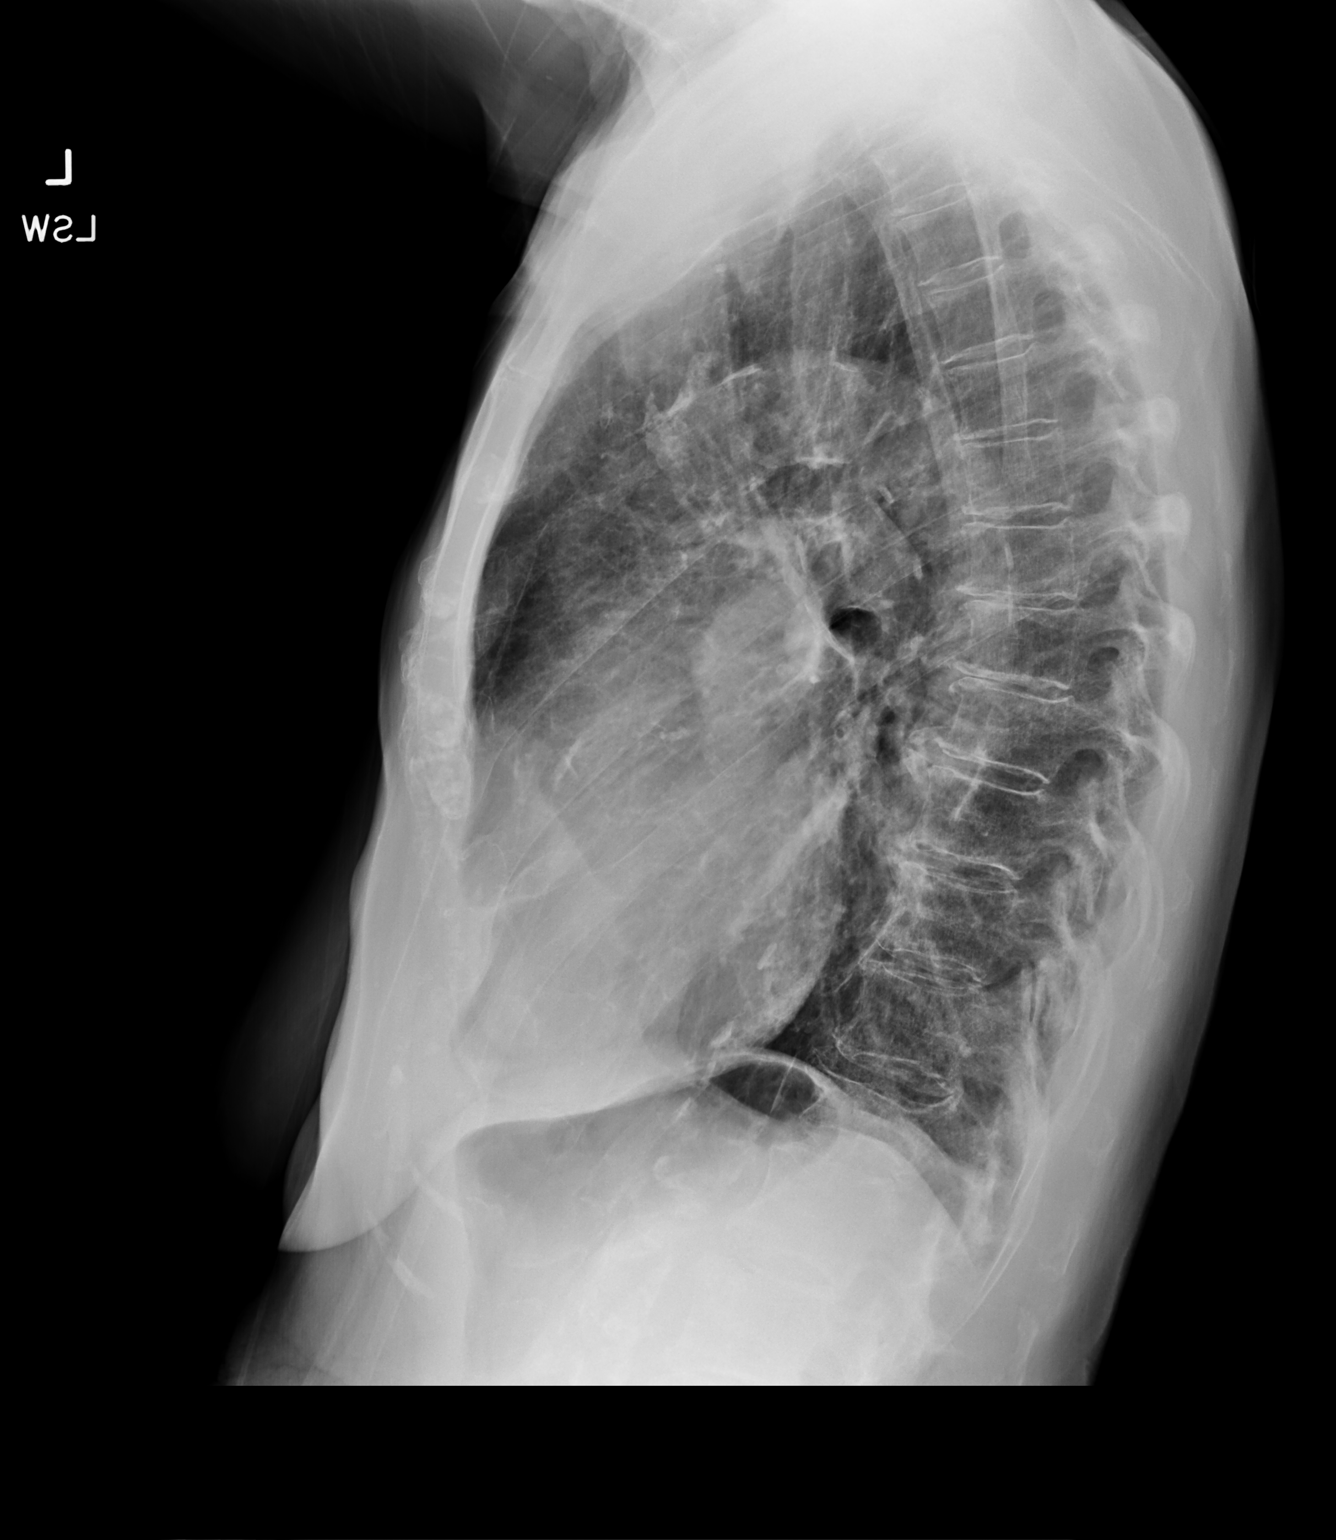

[2 of 2 positions shown; findings below may reference images not displayed]

FINDINGS: PA and lateral views. Large lung volumes. Calcified aortic
atherosclerosis. Borderline to mild cardiomegaly. Other mediastinal
contours are within normal limits. Visualized tracheal air column is
within normal limits. Chronic increased interstitial markings in
both lungs. No pneumothorax, pleural effusion or confluent pulmonary
opacity.

No acute osseous abnormality identified. Mild scoliosis. Negative
visible bowel gas.
IMPRESSION: Evidence of chronic pulmonary hyperinflation. No acute
cardiopulmonary abnormality.

Aortic Atherosclerosis ([T4]-[T4]).

## 2021-07-18 MED ORDER — AMOXICILLIN-POT CLAVULANATE 250-125 MG PO TABS: 1.0000 | ORAL_TABLET | Freq: Two times a day (BID) | ORAL | 0 refills | Status: DC

## 2021-07-18 NOTE — Progress Notes (Signed)
? ?Subjective:  ? ? Patient ID: Natalie [name], female    DOB: Jul 15, 1929, 88 y.o.   MRN: [id number] ? ?CC: Natalie [name] is a 88 y.o. female who presents today for follow up.  ? ?HPI: Productive thick cough x 6 days ago, improved. ?No fever, sob, cp, wheezing,  ear pain, sore throat, sinus infection ? ? ? ?Cardiogram left ventricular ejection fraction 65 to 70%.  Grade 1 diastolic dysfunction 09/19/87 ? ?H/o copd ?Former smoker ? ?She denies any neck pain at this time.  She does not want to pursue physical therapy or MRI cervical spine ? ?Consult 07/06/2021 neurology, Sherrilyn Rist ?Referral to physical therapy she declines ?H/o TIA ? ?Carotid therosclerosis-compliant with Lipitor 40 mg ? ?She is compliant with 81 mg aspirin ? ?No falls.  Declined carotid ultrasound.  Pending 2D echocardiogram ?Discussed dementia, likely vascular. Follow up in one month with neurology ? ?X-rays Showed Moderate disc narrowing C3-C4 C4-C6. ? ? ?HISTORY:  ?Past Medical History:  ?Diagnosis Date  ? Allergies   ? hay fever  ? Arthritis   ? Breast cancer, left ([place]) 2015  ? Left total mastectomy  ? COPD (chronic obstructive pulmonary disease) ([place])   ? GERD (gastroesophageal reflux disease)   ? Hyperlipemia   ? Hypertension   ? Stroke [facility]) 2002  ? TIA  ? ?Past Surgical History:  ?Procedure Laterality Date  ? BREAST BIOPSY  2014  ? BREAST LUMPECTOMY  2014  ? left side  ? ENDARTERECTOMY Left 02/28/2019  ? Procedure: ENDARTERECTOMY CAROTID;  Surgeon: Katha Cabal, MD;  Location: [facility] ORS;  Service: Vascular;  Laterality: Left;  ? EVACUATION OF CERVICAL HEMATOMA Left 02/28/2019  ? Procedure: EVACUATION OF HEMATOMA;  Surgeon: Katha Cabal, MD;  Location: [facility] ORS;  Service: Vascular;  Laterality: Left;  ? EYE SURGERY Bilateral   ? cataract extractions  ? JOINT REPLACEMENT    ? MASTECTOMY Left May 9th 2017  ? RHINOPLASTY  1956  ? TONSILLECTOMY  1939  ? TOTAL HIP ARTHROPLASTY Right   ? head of the femur replaced due to fracture   ? ?Family History  ?Problem Relation Age of Onset  ? Breast cancer Mother   ? Lung cancer Father   ? ? ?Allergies: Influenza vaccines ?Current Outpatient Medications on File Prior to Visit  ?Medication Sig Dispense Refill  ? amLODipine (NORVASC) 10 MG tablet TAKE 1 TABLET BY MOUTH EVERY DAY 90 tablet 1  ? aspirin EC 81 MG tablet Take 81 mg by mouth daily. In preparation for surgery 12/18/ After stopping plavix    ? atorvastatin (LIPITOR) 40 MG tablet TAKE 1 TABLET BY MOUTH EVERY DAY 90 tablet 3  ? Calcium Citrate-Vitamin D 250-200 MG-UNIT TABS Take 250 mg by mouth every morning.    ? cholecalciferol (VITAMIN D3) 25 MCG (1000 UT) tablet Take 1,000 Units by mouth daily.     ? fluticasone (FLONASE) 50 MCG/ACT nasal spray Place 2 sprays into both nostrils daily.    ? lisinopril (ZESTRIL) 5 MG tablet TAKE 1 TABLET BY MOUTH EVERY DAY 90 tablet 0  ? metoprolol tartrate (LOPRESSOR) 50 MG tablet TAKE 1/2 TABLET BY MOUTH TWICE A DAY 90 tablet 0  ? Tiotropium Bromide Monohydrate 2.5 MCG/ACT AERS Inhale 2 puffs into the lungs daily. 4 g 3  ? traZODone (DESYREL) 50 MG tablet Take 0.5 tablets (25 mg total) by mouth at bedtime. 45 tablet 3  ? ?No current facility-administered medications on file prior to visit.  ? ? ?  Social History  ? ?Tobacco Use  ? Smoking status: Former  ? Smokeless tobacco: Never  ? Tobacco comments:  ?  20 years ago  ?Vaping Use  ? Vaping Use: Never used  ?Substance Use Topics  ? Alcohol use: Yes  ?  Comment: occassionally  ? Drug use: Never  ? ? ?Review of Systems  ?Constitutional:  Negative for chills and fever.  ?HENT:  Positive for congestion.   ?Respiratory:  Positive for cough. Negative for shortness of breath and wheezing.   ?Cardiovascular:  Negative for chest pain, palpitations and leg swelling.  ?Gastrointestinal:  Negative for nausea and vomiting.  ?   ?Objective:  ?  ?BP 138/74 (BP Location: Left Arm, Patient Position: Sitting, Cuff Size: Large)   Pulse 83   Temp 97.7 ?F (36.5 ?C) (Oral)   Ht  '5\' 5"'$  (1.651 m)   Wt 121 lb 12.8 oz (55.2 kg)   SpO2 95%   BMI 20.27 kg/m?  ?BP Readings from Last 3 Encounters:  ?07/18/21 138/74  ?07/06/21 129/61  ?06/15/21 118/82  ? ?Wt Readings from Last 3 Encounters:  ?07/18/21 121 lb 12.8 oz (55.2 kg)  ?07/06/21 127 lb (57.6 kg)  ?06/15/21 123 lb (55.8 kg)  ? ? ?Physical Exam ?Vitals reviewed.  ?Constitutional:   ?   Appearance: She is well-developed.  ?HENT:  ?   Head: Normocephalic and atraumatic.  ?   Right Ear: Hearing, tympanic membrane, ear canal and external ear normal. No decreased hearing noted. No drainage, swelling or tenderness. No middle ear effusion. No foreign body. Tympanic membrane is not erythematous or bulging.  ?   Left Ear: Hearing, tympanic membrane, ear canal and external ear normal. No decreased hearing noted. No drainage, swelling or tenderness.  No middle ear effusion. No foreign body. Tympanic membrane is not erythematous or bulging.  ?   Nose: Nose normal. No rhinorrhea.  ?   Right Sinus: No maxillary sinus tenderness or frontal sinus tenderness.  ?   Left Sinus: No maxillary sinus tenderness or frontal sinus tenderness.  ?   Mouth/Throat:  ?   Pharynx: Uvula midline. No oropharyngeal exudate or posterior oropharyngeal erythema.  ?   Tonsils: No tonsillar abscesses.  ?Eyes:  ?   Conjunctiva/sclera: Conjunctivae normal.  ?Cardiovascular:  ?   Rate and Rhythm: Regular rhythm.  ?   Pulses: Normal pulses.  ?   Heart sounds: Normal heart sounds.  ?Pulmonary:  ?   Effort: Pulmonary effort is normal.  ?   Breath sounds: Normal breath sounds. No wheezing, rhonchi or rales.  ?   Comments: Coughing sporadic throughout exam.  Coarse lung sounds. ?Lymphadenopathy:  ?   Head:  ?   Right side of head: No submental, submandibular, tonsillar, preauricular, posterior auricular or occipital adenopathy.  ?   Left side of head: No submental, submandibular, tonsillar, preauricular, posterior auricular or occipital adenopathy.  ?   Cervical: No cervical adenopathy.   ?Skin: ?   General: Skin is warm and dry.  ?Neurological:  ?   Mental Status: She is alert.  ?Psychiatric:     ?   Speech: Speech normal.     ?   Behavior: Behavior normal.     ?   Thought Content: Thought content normal.  ? ? ?   ?Assessment & Plan:  ? ?Problem List Items Addressed This Visit   ? ?  ? Cardiovascular and Mediastinum  ? Carotid atherosclerosis  ?  Chronic, stable.  Continue Lipitor 40 mg,  aspirin '81mg'$  ? ?  ?  ? TIA (transient ischemic attack) - Primary  ?  Reviewed consult note with neurology.  Patient will remain on 81 mg of aspirin ? ?  ?  ? Relevant Orders  ? Hemoglobin A1c (Completed)  ?  ? Respiratory  ? Bronchitis  ?  Uncontrolled, new. History of COPD, former smoker.  Based on comorbidities, coarse lung sounds, concern for bacterial URI.  CrtCl 8m/min .Start augmentin renally dosed.  Chest x-ray is negative for pneumonia, findings of COPD present.  Continue Tiotropium bromide.  Start probiotics, plain Mucinex.  Consider prednisone if symptoms not completely resolved ? ?  ?  ? Relevant Medications  ? amoxicillin-clavulanate (AUGMENTIN) 250-125 MG tablet  ? Other Relevant Orders  ? DG Chest 2 View (Completed)  ? ?Other Visit Diagnoses   ? ? B12 deficiency      ? ?  ? ?Of note: Denies any neck pain at this time.  She declines physical therapy or further imaging including MRI cervical spine.  She will let me know if this were to change ? ?I am having FJoaquim LaiA. Dacquisto start on amoxicillin-clavulanate. I am also having her maintain her Calcium Citrate-Vitamin D, cholecalciferol, fluticasone, aspirin EC, Tiotropium Bromide Monohydrate, amLODipine, traZODone, lisinopril, atorvastatin, and metoprolol tartrate. ? ? ?Meds ordered this encounter  ?Medications  ? amoxicillin-clavulanate (AUGMENTIN) 250-125 MG tablet  ?  Sig: Take 1 tablet by mouth 2 (two) times daily.  ?  Dispense:  7 tablet  ?  Refill:  0  ?  Order Specific Question:   Supervising Provider  ?  Answer:   TCrecencio Mc[[id number]]   ? ? ?Return precautions given.  ? ?Risks, benefits, and alternatives of the medications and treatment plan prescribed today were discussed, and patient expressed understanding.  ? ?Education regarding symptom manag

## 2021-07-18 NOTE — Assessment & Plan Note (Addendum)
Uncontrolled, new. History of COPD, former smoker.  Based on comorbidities, coarse lung sounds, concern for bacterial URI.  CrtCl 69m/min .Start augmentin renally dosed.  Chest x-ray is negative for pneumonia, findings of COPD present.  Continue Tiotropium bromide.  Start probiotics, plain Mucinex.  Consider prednisone if symptoms not completely resolved ?

## 2021-07-18 NOTE — Patient Instructions (Signed)
Start Augmentin for cough.  Please let me know if cough and congestion do not start to improve in the next day on antibiotic.  You may continue plain Mucinex with plenty water ?Ensure to take probiotics while on antibiotics and also for 2 weeks after completion. This can either be by eating yogurt daily or taking a probiotic supplement over the counter such as Culturelle.It is important to re-colonize the gut with good bacteria and also to prevent any diarrheal infections associated with antibiotic use.  ? ?Nice to see you! ? ? ?

## 2021-07-19 ENCOUNTER — Telehealth: Payer: Self-pay | Admitting: Family

## 2021-07-19 ENCOUNTER — Ambulatory Visit: Payer: Medicare Other

## 2021-07-19 ENCOUNTER — Encounter: Payer: Self-pay | Admitting: Family

## 2021-07-19 LAB — B12 AND FOLATE PANEL
Folate: 18.3 ng/mL (ref >5.9)
Vitamin B-12: >1504 pg/mL — ABNORMAL HIGH (ref 211–911)

## 2021-07-19 LAB — CELIAC DISEASE AB SCREEN W/RFX
Antigliadin Abs, IgA: 11 units (ref 0–19)
IgA/Immunoglobulin A, Serum: 429 mg/dL — ABNORMAL HIGH (ref 64–422)
Transglutaminase IgA: <2 U/mL (ref 0–3)

## 2021-07-19 NOTE — Telephone Encounter (Signed)
Spoke to patients daughter [name] and she stated that when they went to pharmacy on yesterday to pick up The Augmentin it was not there. I explained to her that it was ordered  and asked her if she wanted me to call and check for her I would but she stated that she would call and give [name] a call back if she was still unable to get it. ?

## 2021-07-19 NOTE — Telephone Encounter (Signed)
Patient was seen yesterday by Natalie [name]. Daughter [name] called and said none of the medication [name] wanted her to take was sent  into pharmacy. Pharmacy is [facility] on [address], [place]. ?

## 2021-07-19 NOTE — Assessment & Plan Note (Signed)
Reviewed consult note with neurology.  Patient will remain on 81 mg of aspirin ?

## 2021-07-19 NOTE — Telephone Encounter (Signed)
Call daughter and ensure she was able to pick up ? ?Please see result note for chest xray ?Review with patient please ?

## 2021-07-19 NOTE — Assessment & Plan Note (Signed)
Chronic, stable.  Continue Lipitor 40 mg, aspirin '81mg'$  ?

## 2021-07-20 ENCOUNTER — Other Ambulatory Visit: Payer: Self-pay

## 2021-07-20 ENCOUNTER — Other Ambulatory Visit: Payer: Self-pay | Admitting: Family

## 2021-07-20 ENCOUNTER — Telehealth: Payer: Self-pay

## 2021-07-20 DIAGNOSIS — J449 Chronic obstructive pulmonary disease, unspecified: Secondary | ICD-10-CM

## 2021-07-20 MED ORDER — TIOTROPIUM BROMIDE MONOHYDRATE 2.5 MCG/ACT IN AERS: 2.0000 | INHALATION_SPRAY | Freq: Every day | RESPIRATORY_TRACT | 3 refills | Status: DC

## 2021-07-20 NOTE — Telephone Encounter (Signed)
LMTCB for chest xray results. Need to speak with patient.  ?

## 2021-07-22 ENCOUNTER — Encounter: Payer: Self-pay | Admitting: Family

## 2021-07-22 DIAGNOSIS — D510 Vitamin B12 deficiency anemia due to intrinsic factor deficiency: Secondary | ICD-10-CM | POA: Insufficient documentation

## 2021-07-22 LAB — HOMOCYSTEINE: Homocysteine: 13.8 umol/L — ABNORMAL HIGH (ref <10.4)

## 2021-07-22 LAB — ANTI-PARIETAL ANTIBODY: PARIETAL CELL AB SCREEN: NEGATIVE

## 2021-07-22 LAB — INTRINSIC FACTOR ANTIBODIES: Intrinsic Factor: POSITIVE — AB

## 2021-07-22 LAB — METHYLMALONIC ACID, SERUM: Methylmalonic Acid, Quant: 114 nmol/L (ref 87–318)

## 2021-07-26 ENCOUNTER — Encounter: Payer: Self-pay | Admitting: Gastroenterology

## 2021-07-26 ENCOUNTER — Other Ambulatory Visit: Payer: Self-pay

## 2021-07-26 DIAGNOSIS — D510 Vitamin B12 deficiency anemia due to intrinsic factor deficiency: Secondary | ICD-10-CM

## 2021-07-26 NOTE — Telephone Encounter (Signed)
Patient has appointment on 07/27/21 and will discuss Chest xray results then. ?

## 2021-07-27 ENCOUNTER — Ambulatory Visit (INDEPENDENT_AMBULATORY_CARE_PROVIDER_SITE_OTHER): Payer: Medicare Other | Admitting: Family

## 2021-07-27 ENCOUNTER — Encounter: Payer: Self-pay | Admitting: Family

## 2021-07-27 VITALS — BP 118/60 | HR 71 | Temp 97.5°F | Ht 65.0 in | Wt 126.4 lb

## 2021-07-27 DIAGNOSIS — J449 Chronic obstructive pulmonary disease, unspecified: Secondary | ICD-10-CM

## 2021-07-27 DIAGNOSIS — J40 Bronchitis, not specified as acute or chronic: Secondary | ICD-10-CM

## 2021-07-27 DIAGNOSIS — J441 Chronic obstructive pulmonary disease with (acute) exacerbation: Secondary | ICD-10-CM | POA: Diagnosis not present

## 2021-07-27 MED ORDER — IPRATROPIUM-ALBUTEROL 0.5-2.5 (3) MG/3ML IN SOLN: 3.0000 mL | Freq: Once | RESPIRATORY_TRACT | Status: DC

## 2021-07-27 MED ORDER — IPRATROPIUM-ALBUTEROL 0.5-2.5 (3) MG/3ML IN SOLN: 3.0000 mL | Freq: Four times a day (QID) | RESPIRATORY_TRACT | 3 refills | Status: DC | PRN

## 2021-07-27 NOTE — Patient Instructions (Addendum)
Start Mucinex to break up thick congestion with plenty of water. ?Please continue maintenance inhaler tiotropium bromide ? ?I have ave also prescribed DuoNeb nebulizer solution to go with a nebulizer machine. ? ?As discussed, if you not continue to feel that your cough and congestion are improving, please call me right away. I would then send in prednisone to see if helpful for continued symptoms.  ? ?Chronic Obstructive Pulmonary Disease Exacerbation ? ?Chronic obstructive pulmonary disease (COPD) is a long-term (chronic) condition that affects the lungs. COPD is a general term that can be used to describe many different lung problems that cause lung inflammation and limit airflow, including chronic bronchitis and emphysema. COPD exacerbations are episodes when breathing symptoms flare up, become much worse, and require extra treatment. ?COPD exacerbations are usually caused by infections. Without treatment, COPD exacerbations can be severe and even life threatening. Frequent COPD exacerbations can cause further damage to the lungs. ?What are the causes? ?This condition may be caused by: ?Respiratory infections, including viral and bacterial infections. ?Exposure to smoke. ?Exposure to air pollution, chemical fumes, or dust. ?Things that can cause an allergic reaction (allergens). ?Not taking your usual COPD medicines as directed. ?Underlying medical problems, such as congestive heart failure or infections not involving the lungs. ?In many cases, the cause of this condition is not known. ?What increases the risk? ?The following factors may make you more likely to develop this condition: ?Smoking cigarettes. ?Being an older adult. ?Having frequent prior COPD exacerbations. ?What are the signs or symptoms? ?Symptoms of this condition include: ?Increased coughing. ?Increased production of mucus from your lungs. ?Increased wheezing and shortness of breath. ?Rapid or labored breathing. ?Chest tightness. ?Less energy  than usual. ?Sleep disruption from symptoms. ?Confusion ?Increased sleepiness. ?Often, these symptoms happen or get worse even with the use of medicines. ?How is this diagnosed? ?This condition is diagnosed based on: ?Your medical history. ?A physical exam. ?You may also have tests, including: ?A chest X-ray. ?Blood tests. ?Lung (pulmonary) function tests. ?How is this treated? ?Treatment for this condition depends on the severity and cause of the symptoms. You may need to be admitted to a hospital for treatment. Some of the treatments commonly used to treat COPD exacerbations are: ?Antibiotic medicines. These may be used for severe exacerbations caused by a lung infection, such as pneumonia. ?Bronchodilators. These are inhaled medicines that expand the air passages and allow increased airflow. They may make your breathing more comfortable. ?Steroid medicines. These act to reduce inflammation in the airways. They may be given with an inhaler, taken by mouth, or given through an IV tube inserted into one of your veins. ?Supplemental oxygen therapy. ?Airway clearing techniques, such as noninvasive ventilation (NIV) and positive expiratory pressure (PEP). These provide respiratory support through a mask or other noninvasive device. An example of this would be using a continuous positive airway pressure (CPAP) machine to improve delivery of oxygen into your lungs. ?Follow these instructions at home: ?Medicines ?Take over-the-counter and prescription medicines only as told by your health care provider. ?It is important to use correct technique with inhaled medicines. ?If you were prescribed an antibiotic medicine or oral steroid, take it as told by your health care provider. Do not stop taking the medicine even if you start to feel better. ?Lifestyle ?Do not use any products that contain nicotine or tobacco. These products include cigarettes, chewing tobacco, and vaping devices, such as e-cigarettes. If you need help  quitting, ask your health care provider. ?Eat a healthy diet. ?  Exercise regularly. ?Get enough sleep. Most adults need 7 or more hours per night. ?Avoid exposure to all substances that irritate the airway, especially tobacco smoke. ?Regularly wash your hands with soap and water for at least 20 seconds. If soap and water are not available, use hand sanitizer. This may help prevent you from getting infections. ?During flu season, avoid enclosed spaces that are crowded with people. ?General instructions ?Drink enough fluid to keep your urine pale yellow, unless you have a medical condition that requires fluid restriction. ?Use a cool mist vaporizer. This humidifies the air and makes it easier for you to clear your chest when you cough. ?If you have a home nebulizer and oxygen, continue to use them as told by your health care provider. ?Keep all follow-up visits. This is important. ?How is this prevented? ?Stay up-to-date on pneumococcal and flu (influenza) vaccines. A flu shot is recommended every year to help prevent exacerbations. ?Quitting smoking is very important in preventing COPD from getting worse and in preventing exacerbations from happening as often. ?Follow all instructions for pulmonary rehabilitation after a recent exacerbation. This can help prevent future exacerbations. ?Work with your health care provider to develop and follow an action plan. This tells you what steps to take when you experience certain symptoms. ?Contact a health care provider if: ?You have a worsening of your regular COPD symptoms. ?Get help right away if: ?You have worsening shortness of breath, even when resting. ?You have trouble talking. ?You have severe chest pain. ?You cough up blood. ?You have a fever. ?You have weakness, vomit repeatedly, or faint. ?You feel confused. ?You are not able to sleep because of your symptoms. ?You have trouble doing daily activities. ?These symptoms may represent a serious problem that is an  emergency. Do not wait to see if the symptoms will go away. Get medical help right away. Call your local emergency services (911 in the [place] Do not drive yourself to the hospital. ?Summary ?COPD exacerbations are episodes when breathing symptoms become much worse and require extra treatment above your normal treatment. ?Exacerbations can be severe and even life threatening. Frequent COPD exacerbations can cause further damage to your lungs. ?COPD exacerbations are usually triggered by infections such as the flu, colds, and even pneumonia. ?Treatment for this condition depends on the severity and cause of the symptoms. You may need to be admitted to a hospital for treatment. ?Quitting smoking is very important to prevent COPD from getting worse and to prevent exacerbations from happening as often. ?This information is not intended to replace advice given to you by your health care provider. Make sure you discuss any questions you have with your health care provider. ?Document Revised: 01/06/2020 Document Reviewed: 01/06/2020 ?Elsevier Patient Education ? [place]. ? ? ? ?

## 2021-07-27 NOTE — Progress Notes (Signed)
? ? ?Subjective:  ? ? Patient ID: Natalie [name], female    DOB: 11-02-1929, 88 y.o.   MRN: [id number] ? ?CC: Natalie [name] is a 88 y.o. female who presents today for follow up.  ? ?HPI: Follow-up bronchitis.  ?Accompanied by daughter ?Cough has improved. She feels congestion is breaking up No fever, sob, cp, sinus pain.  ? She has completed Augmentin.  She is compliant with the tiotropium bromide and resumed one week ago. She stopped using mucinex last week.  ? ? ? ?HISTORY:  ?Past Medical History:  ?Diagnosis Date  ? Allergies   ? hay fever  ? Arthritis   ? Breast cancer, left ([place]) 2015  ? Left total mastectomy  ? COPD (chronic obstructive pulmonary disease) ([name])   ? GERD (gastroesophageal reflux disease)   ? Hyperlipemia   ? Hypertension   ? Stroke [facility]) 2002  ? TIA  ? ?Past Surgical History:  ?Procedure Laterality Date  ? BREAST BIOPSY  2014  ? BREAST LUMPECTOMY  2014  ? left side  ? ENDARTERECTOMY Left 02/28/2019  ? Procedure: ENDARTERECTOMY CAROTID;  Surgeon: Katha Cabal, MD;  Location: [facility] ORS;  Service: Vascular;  Laterality: Left;  ? EVACUATION OF CERVICAL HEMATOMA Left 02/28/2019  ? Procedure: EVACUATION OF HEMATOMA;  Surgeon: Katha Cabal, MD;  Location: [facility] ORS;  Service: Vascular;  Laterality: Left;  ? EYE SURGERY Bilateral   ? cataract extractions  ? JOINT REPLACEMENT    ? MASTECTOMY Left May 9th 2017  ? RHINOPLASTY  1956  ? TONSILLECTOMY  1939  ? TOTAL HIP ARTHROPLASTY Right   ? head of the femur replaced due to fracture  ? ?Family History  ?Problem Relation Age of Onset  ? Breast cancer Mother   ? Lung cancer Father   ? ? ?Allergies: Influenza vaccines ?Current Outpatient Medications on File Prior to Visit  ?Medication Sig Dispense Refill  ? amLODipine (NORVASC) 10 MG tablet TAKE 1 TABLET BY MOUTH EVERY DAY 90 tablet 1  ? aspirin EC 81 MG tablet Take 81 mg by mouth daily. In preparation for surgery 12/18/ After stopping plavix    ? atorvastatin (LIPITOR) 40 MG tablet TAKE 1  TABLET BY MOUTH EVERY DAY 90 tablet 3  ? Calcium Citrate-Vitamin D 250-200 MG-UNIT TABS Take 250 mg by mouth every morning.    ? cholecalciferol (VITAMIN D3) 25 MCG (1000 UT) tablet Take 1,000 Units by mouth daily.     ? fluticasone (FLONASE) 50 MCG/ACT nasal spray Place 2 sprays into both nostrils daily.    ? lisinopril (ZESTRIL) 5 MG tablet TAKE 1 TABLET BY MOUTH EVERY DAY 90 tablet 0  ? metoprolol tartrate (LOPRESSOR) 50 MG tablet TAKE 1/2 TABLET BY MOUTH TWICE A DAY 90 tablet 0  ? Tiotropium Bromide Monohydrate 2.5 MCG/ACT AERS Inhale 2 puffs into the lungs daily. 4 g 3  ? traZODone (DESYREL) 50 MG tablet Take 0.5 tablets (25 mg total) by mouth at bedtime. 45 tablet 3  ? ?No current facility-administered medications on file prior to visit.  ? ? ?Social History  ? ?Tobacco Use  ? Smoking status: Former  ? Smokeless tobacco: Never  ? Tobacco comments:  ?  20 years ago  ?Vaping Use  ? Vaping Use: Never used  ?Substance Use Topics  ? Alcohol use: Yes  ?  Comment: occassionally  ? Drug use: Never  ? ? ?Review of Systems  ?Constitutional:  Negative for chills and fever.  ?HENT:  Positive for  congestion.   ?Respiratory:  Positive for cough. Negative for shortness of breath.   ?Cardiovascular:  Negative for chest pain and palpitations.  ?Gastrointestinal:  Negative for nausea and vomiting.  ?   ?Objective:  ?  ?BP 118/60 (BP Location: Left Arm, Patient Position: Sitting, Cuff Size: Normal)   Pulse 71   Temp (!) 97.5 ?F (36.4 ?C) (Oral)   Ht '5\' 5"'$  (1.651 m)   Wt 126 lb 6.4 oz (57.3 kg)   SpO2 95%   BMI 21.03 kg/m?  ?BP Readings from Last 3 Encounters:  ?07/27/21 118/60  ?07/18/21 138/74  ?07/06/21 129/61  ? ?Wt Readings from Last 3 Encounters:  ?07/27/21 126 lb 6.4 oz (57.3 kg)  ?07/18/21 121 lb 12.8 oz (55.2 kg)  ?07/06/21 127 lb (57.6 kg)  ? ? ?Physical Exam ?Vitals reviewed.  ?Constitutional:   ?   Appearance: She is well-developed.  ?HENT:  ?   Head: Normocephalic and atraumatic.  ?   Right Ear: Hearing,  tympanic membrane, ear canal and external ear normal. No decreased hearing noted. No drainage, swelling or tenderness. No middle ear effusion. No foreign body. Tympanic membrane is not erythematous or bulging.  ?   Left Ear: Hearing, tympanic membrane, ear canal and external ear normal. No decreased hearing noted. No drainage, swelling or tenderness.  No middle ear effusion. No foreign body. Tympanic membrane is not erythematous or bulging.  ?   Nose: Nose normal. No rhinorrhea.  ?   Right Sinus: No maxillary sinus tenderness or frontal sinus tenderness.  ?   Left Sinus: No maxillary sinus tenderness or frontal sinus tenderness.  ?   Mouth/Throat:  ?   Pharynx: Uvula midline. No oropharyngeal exudate or posterior oropharyngeal erythema.  ?   Tonsils: No tonsillar abscesses.  ?Eyes:  ?   Conjunctiva/sclera: Conjunctivae normal.  ?Cardiovascular:  ?   Rate and Rhythm: Regular rhythm.  ?   Pulses: Normal pulses.  ?   Heart sounds: Normal heart sounds.  ?Pulmonary:  ?   Effort: Pulmonary effort is normal.  ?   Breath sounds: Examination of the right-lower field reveals decreased breath sounds. Examination of the left-lower field reveals decreased breath sounds. Decreased breath sounds present. No wheezing, rhonchi or rales.  ?Lymphadenopathy:  ?   Head:  ?   Right side of head: No submental, submandibular, tonsillar, preauricular, posterior auricular or occipital adenopathy.  ?   Left side of head: No submental, submandibular, tonsillar, preauricular, posterior auricular or occipital adenopathy.  ?   Cervical: No cervical adenopathy.  ?Skin: ?   General: Skin is warm and dry.  ?Neurological:  ?   Mental Status: She is alert.  ?Psychiatric:     ?   Speech: Speech normal.     ?   Behavior: Behavior normal.     ?   Thought Content: Thought content normal.  ? ?Patient felt significantly better after albuterol treatment. Lung sounds clear and increased ? ?   ?Assessment & Plan:  ? ?Problem List Items Addressed This Visit    ? ?  ? Respiratory  ? Bronchitis  ?  Suspected COPD exacerbation. She has resumed tiotropim bromide 2.'5mg'$  last week. Improved symptoms overall.  She is well-appearing today. ?Improved lung sounds after nebulizer.  We have opted to send patient home with a nebulizer and teaching in terms of how to properly use.  I prescribed DuoNeb nebulizer solution.  Advised I would suspect continued gradual improvement.  She will resume Mucinex.  Advised  daughter and patient have a low threshold for calling to let me know if symptoms continue to improve and patient return to her previous baseline as would warrant prednisone taper and pulmonology consult. ? ?  ?  ? COPD (chronic obstructive pulmonary disease) ([place]) - Primary  ? Relevant Medications  ? ipratropium-albuterol (DUONEB) 0.5-2.5 (3) MG/3ML nebulizer solution 3 mL  ? ipratropium-albuterol (DUONEB) 0.5-2.5 (3) MG/3ML SOLN  ? ? ? ?I have discontinued Joaquim Lai A. Onstott's amoxicillin-clavulanate. I am also having her start on ipratropium-albuterol. Additionally, I am having her maintain her Calcium Citrate-Vitamin D, cholecalciferol, fluticasone, aspirin EC, amLODipine, traZODone, lisinopril, atorvastatin, metoprolol tartrate, and Tiotropium Bromide Monohydrate. We will continue to administer ipratropium-albuterol. ? ? ?Meds ordered this encounter  ?Medications  ? ipratropium-albuterol (DUONEB) 0.5-2.5 (3) MG/3ML nebulizer solution 3 mL  ? ipratropium-albuterol (DUONEB) 0.5-2.5 (3) MG/3ML SOLN  ?  Sig: Take 3 mLs by nebulization 4 (four) times daily as needed.  ?  Dispense:  360 mL  ?  Refill:  3  ?  Order Specific Question:   Supervising Provider  ?  Answer:   Crecencio Mc [[id number]]  ? ? ?Return precautions given.  ? ?Risks, benefits, and alternatives of the medications and treatment plan prescribed today were discussed, and patient expressed understanding.  ? ?Education regarding symptom management and diagnosis given to patient on AVS. ? ?Continue to follow with [name], FNP for routine health maintenance.  ? ?Danie Chandler and I agreed with plan.  ? ?Mable Paris, FNP ? ? ?

## 2021-07-27 NOTE — Assessment & Plan Note (Signed)
Suspected COPD exacerbation. She has resumed tiotropim bromide 2.'5mg'$  last week. Improved symptoms overall.  She is well-appearing today. ?Improved lung sounds after nebulizer.  We have opted to send patient home with a nebulizer and teaching in terms of how to properly use.  I prescribed DuoNeb nebulizer solution.  Advised I would suspect continued gradual improvement.  She will resume Mucinex.  Advised daughter and patient have a low threshold for calling to let me know if symptoms continue to improve and patient return to her previous baseline as would warrant prednisone taper and pulmonology consult. ?

## 2021-08-02 ENCOUNTER — Other Ambulatory Visit (INDEPENDENT_AMBULATORY_CARE_PROVIDER_SITE_OTHER): Payer: Self-pay | Admitting: Vascular Surgery

## 2021-08-02 DIAGNOSIS — I6523 Occlusion and stenosis of bilateral carotid arteries: Secondary | ICD-10-CM

## 2021-08-03 ENCOUNTER — Ambulatory Visit (INDEPENDENT_AMBULATORY_CARE_PROVIDER_SITE_OTHER): Payer: Medicare Other

## 2021-08-03 ENCOUNTER — Ambulatory Visit (INDEPENDENT_AMBULATORY_CARE_PROVIDER_SITE_OTHER): Payer: Medicare Other | Admitting: Nurse Practitioner

## 2021-08-03 ENCOUNTER — Encounter (INDEPENDENT_AMBULATORY_CARE_PROVIDER_SITE_OTHER): Payer: Self-pay | Admitting: Nurse Practitioner

## 2021-08-03 VITALS — BP 116/55 | HR 59 | Resp 15 | Wt 123.2 lb

## 2021-08-03 DIAGNOSIS — I6523 Occlusion and stenosis of bilateral carotid arteries: Secondary | ICD-10-CM

## 2021-08-03 DIAGNOSIS — I10 Essential (primary) hypertension: Secondary | ICD-10-CM

## 2021-08-03 DIAGNOSIS — E785 Hyperlipidemia, unspecified: Secondary | ICD-10-CM | POA: Diagnosis not present

## 2021-08-04 NOTE — Progress Notes (Incomplete)
[name] Clinic Note - Initial Visit   Date: 08/04/21  Natalie [name] MRN: [id number] DOB: [birth year]   Dear: [name], FNP:  Thank you for your kind referral of Natalie [name] for consultation of TIA. Although her history is well known to you, please allow [name] to reiterate it for the purpose of our medical record. The patient was accompanied to the clinic by her daughter who also provides collateral information.     History of Present Illness: Natalie [name] is a 88 y.o. R-handed female with a history of carotid artery stenosis status post CEA in 2020, hypertension, CKD, history left breast cancer status post left mastectomy, of right breast mass (declining studies), B12 deficiency receiving regular injections, history of TIA in 2002, insomnia, history of cervicogenic headache, presenting for evaluation of recent TIA.  In review, the patient had an episode of 06/13/2021 of right lip drooping associated with severe headache, self-limited, very brief, without recurrence.  EMS was called, however she adamantly declined further evaluation at the ED.  Per chart notes, vital signs were normal at the time, without one-sided weakness, or slurred speech.  She did have some unsteady balance, almost falling, when her daughter had to catch her before hitting the floor.  She may had a moment of confusion at the time, which per notes sounded like vascular dementia, but her daughter states that her memory is improved since that event.  The patient denies any vertigo, dizziness, or vision changes.  She denies dysphagia.  No loss of consciousness.  Denies seizures.  Denies any chest pain or shortness of breath, fever, chills or recent infections.  Occasionally she has night sweats.  She denies any tobacco since 2009.  No new medications or hormonal supplements.  She takes a regular aspirin a day, without any other antiplatelets or anticoagulants.  She denies any recent  long distance trips or recent surgeries.  No sick contacts.  Denies any new stressors in personal life.  She is compliant with her medications.  Patient is not very active, but fairly independent.  She uses a walker when she is outside.  She denies being a diabetic.  Family history remarkable for stroke in grandmother.  She has some memory difficulties for at least 3 years, and does repeat herself.   Lab Results  Component Value Date   HGBA1C 6.0 07/18/2021   Lab Results  Component Value Date   VITAMINB12 >1504 (H) 07/18/2021   Lab Results  Component Value Date   TSH 1.40 06/15/2021   No results found for: ESRSEDRATE, POCTSEDRATE  Past Medical History:  Diagnosis Date   Allergies    hay fever   Arthritis    Breast cancer, left ([place]) 2015   Left total mastectomy   COPD (chronic obstructive pulmonary disease) ([place])    GERD (gastroesophageal reflux disease)    Hyperlipemia    Hypertension    Stroke ([place]) 2002   TIA    Past Surgical History:  Procedure Laterality Date   BREAST BIOPSY  2014   BREAST LUMPECTOMY  2014   left side   ENDARTERECTOMY Left 02/28/2019   Procedure: ENDARTERECTOMY CAROTID;  Surgeon: Katha Cabal, MD;  Location: [facility] ORS;  Service: Vascular;  Laterality: Left;   EVACUATION OF CERVICAL HEMATOMA Left 02/28/2019   Procedure: EVACUATION OF HEMATOMA;  Surgeon: Katha Cabal, MD;  Location: [facility] ORS;  Service: Vascular;  Laterality: Left;   EYE SURGERY Bilateral    cataract extractions  JOINT REPLACEMENT     MASTECTOMY Left May 9th 2017   RHINOPLASTY  1956   TONSILLECTOMY  1939   TOTAL HIP ARTHROPLASTY Right    head of the femur replaced due to fracture     Medications:  Outpatient Encounter Medications as of 08/05/2021  Medication Sig   amLODipine (NORVASC) 10 MG tablet TAKE 1 TABLET BY MOUTH EVERY DAY   aspirin EC 81 MG tablet Take 81 mg by mouth daily. In preparation for surgery 12/18/ After stopping plavix   atorvastatin (LIPITOR) 40  MG tablet TAKE 1 TABLET BY MOUTH EVERY DAY   Calcium Citrate-Vitamin D 250-200 MG-UNIT TABS Take 250 mg by mouth every morning.   cholecalciferol (VITAMIN D3) 25 MCG (1000 UT) tablet Take 1,000 Units by mouth daily.    fluticasone (FLONASE) 50 MCG/ACT nasal spray Place 2 sprays into both nostrils daily.   ipratropium-albuterol (DUONEB) 0.5-2.5 (3) MG/3ML SOLN Take 3 mLs by nebulization 4 (four) times daily as needed.   lisinopril (ZESTRIL) 5 MG tablet TAKE 1 TABLET BY MOUTH EVERY DAY   metoprolol tartrate (LOPRESSOR) 50 MG tablet TAKE 1/2 TABLET BY MOUTH TWICE A DAY   Tiotropium Bromide Monohydrate 2.5 MCG/ACT AERS Inhale 2 puffs into the lungs daily.   traZODone (DESYREL) 50 MG tablet Take 0.5 tablets (25 mg total) by mouth at bedtime. (Patient taking differently: Take 25 mg by mouth at bedtime as needed.)   Facility-Administered Encounter Medications as of 08/05/2021  Medication   ipratropium-albuterol (DUONEB) 0.5-2.5 (3) MG/3ML nebulizer solution 3 mL    Allergies:  Allergies  Allergen Reactions   Influenza Vaccines Shortness Of Breath    Family History: Family History  Problem Relation Age of Onset   Breast cancer Mother    Lung cancer Father     Social History: Social History   Tobacco Use   Smoking status: Former   Smokeless tobacco: Never   Tobacco comments:    20 years ago  Vaping Use   Vaping Use: Never used  Substance Use Topics   Alcohol use: Yes    Comment: occassionally   Drug use: Never   Social History   Social History Narrative   Patient lives with her daughter   Right handed'   2 sons    Vital Signs:  There were no vitals taken for this visit.   General Medical Exam:   General:  Well appearing, comfortable.   Eyes/ENT: see cranial nerve examination.   Neck:   No carotid bruits. Respiratory:  Clear to auscultation, good air entry bilaterally.   Cardiac:  Regular rate and rhythm, no murmur.   Extremities:  No deformities, edema, or skin  discoloration.  Skin:  No rashes or lesions.  Neurological Exam: MENTAL STATUS including orientation to time, place, person, recent memory impaired, remote memory intact.  Normal attention and concentration, language normal, fund of knowledge is normal.  Speech is not dysarthric.  CRANIAL NERVES: II:  No visual field defects.  Unremarkable fundi.   III-IV-VI: Pupils equal round and reactive to light.  Normal conjugate, extra-ocular eye movements in all directions of gaze.  No nystagmus.  No ptosis V:  Normal facial sensation.    VII:  Normal facial symmetry and movements.   VIII: Decreased hearing IX-X:  Normal palatal movement.   XI:  Normal shoulder shrug and head rotation.   XII:  Normal tongue strength and range of motion, no deviation or fasciculation.  MOTOR:  No atrophy, fasciculations or abnormal movements.  No  pronator drift.    SENSORY:  Normal and symmetric perception of light touch, pinprick, vibration, and proprioception.  Romberg's sign absent.   COORDINATION/GAIT: Normal finger-to- nose-finger and heel-to-shin.  Intact rapid alternating movements bilaterally.  Unable to rise from a chair without using arms.  Gait narrow based but somewhat unstable, unable to tandem walk, needed to hold to an object.    MRI brain 06/13/21 No acute intracranial process. No etiology is seen for the patient's headache or left facial droop. 2. Degenerative changes in the partially imaged cervical spine, concerning for moderate spinal canal stenosis at C3-C4. This could be further evaluated with a noncontrast MRI if clinically indicated.     07/07/2021    7:00 AM  Montreal Cognitive Assessment   Visuospatial/ Executive (0/5) 0  Naming (0/3) 2  Attention: Read list of digits (0/2) 2  Attention: Read list of letters (0/1) 1  Attention: Serial 7 subtraction starting at 100 (0/3) 1  Language: Repeat phrase (0/2) 1  Language : Fluency (0/1) 0  Abstraction (0/2) 0  Delayed Recall (0/5) 2   Orientation (0/6) 4  Total 13  Adjusted Score (based on education) 13    IMPRESSION/PLAN:   TIA in the form of left  lip droop, lasting about 1 minute, with complete resolution.  She has a prior history of TIA, likely due to carotid artery stenosis, status post left CEA in 2020.  MRI of the brain on 06/15/21  negative for acute findings, without significant explanation for her facial droop.  No facial droop is noted today on exam.  Some instability on her gait is noted today.  Lipid panel and A1c is being checked by her PCP.  Patient is on baby aspirin daily.  Discussed performing carotid ultrasound, patient and daughter are not interested at this time, as they are not interested in pursuing any invasive procedures. 2D echo to rule out any cardioembolic source. PT/OT Recommend cautious control of her blood pressure, sugars and lipids , continue statins and blood pressure medication. Continue aspirin daily  Dementia, likely vascular, MoCA today 13/30, with deficiencies in visuospatial executive 0/5, delayed recall 2/5, attention  was essentially normal Patient to return to the clinic in 1 month, for further evaluation of her memory, and to further discuss her TIA and results of the echo.   Total time spent: 60 minutes  Case discussed with Dr. Tomi Likens, who agrees with the plan. Thank you for allowing me to participate in patient's care.  If I can answer any additional questions, I would be pleased to do so.    Sincerely,   Sharene Butters, PA-C

## 2021-08-05 ENCOUNTER — Ambulatory Visit: Payer: Medicare Other | Admitting: Physician Assistant

## 2021-08-05 ENCOUNTER — Encounter: Payer: Self-pay | Admitting: Physician Assistant

## 2021-08-05 VITALS — BP 159/63 | HR 56 | Resp 18 | Ht 65.0 in | Wt 125.0 lb

## 2021-08-05 DIAGNOSIS — G459 Transient cerebral ischemic attack, unspecified: Secondary | ICD-10-CM | POA: Diagnosis not present

## 2021-08-05 DIAGNOSIS — F0150 Vascular dementia without behavioral disturbance: Secondary | ICD-10-CM | POA: Diagnosis not present

## 2021-08-05 MED ORDER — MEMANTINE HCL 5 MG PO TABS: ORAL_TABLET | ORAL | 11 refills | Status: DC

## 2021-08-05 NOTE — Patient Instructions (Signed)
It was a pleasure to see you today at our office.   Recommendations:  Follow up in 6  months  Start Memantine 5 mg: Take 1 tablet (5 mg at night) for 2 weeks, then increase to 1 tablet (5 mg) twice a day   Recommend Physical therapy Continue B12 supplements  Make sure to care for the cardiovascular risk factors to prevent strokes  COntinue aspirin daily  Follow up with the other specialties    Whom to call:  Memory  decline, memory medications: Call out office [phone]   For psychiatric meds, mood meds: Please have your primary care physician manage these medications.   Counseling regarding caregiver distress, including caregiver depression, anxiety and issues regarding community resources, adult day care programs, adult living facilities, or memory care questions:   Feel free to contact [place], Social Worker at [phone]   For assessment of decision of mental capacity and competency:  Call Dr. Anthoney Harada, geriatric psychiatrist at [phone]  For guidance in geriatric dementia issues please call [facility] [phone]  For guidance regarding [facility] and if placement were needed at the facility, contact Arnell Asal, Social Worker tel: [phone]  If you have any severe symptoms of a stroke, or other severe issues such as confusion,severe chills or fever, etc call 911 or go to the ER as you may need to be evaluate further   Feel free to visit Facebook page " [employer]" for tips of how to care for people with memory problems.    Feel free to go to the following database for funded clinical studies conducted around the world: http://saunders.com/   https://www.triadclinicaltrials.com/     RECOMMENDATIONS FOR ALL PATIENTS WITH MEMORY PROBLEMS: 1. Continue to exercise (Recommend 30 minutes of walking everyday, or 3 hours every week) 2. Increase social interactions - continue going to [place] and enjoy social  gatherings with friends and family 3. Eat healthy, avoid fried foods and eat more fruits and vegetables 4. Maintain adequate blood pressure, blood sugar, and blood cholesterol level. Reducing the risk of stroke and cardiovascular disease also helps promoting better memory. 5. Avoid stressful situations. Live a simple life and avoid aggravations. Organize your time and prepare for the next day in anticipation. 6. Sleep well, avoid any interruptions of sleep and avoid any distractions in the bedroom that may interfere with adequate sleep quality 7. Avoid sugar, avoid sweets as there is a strong link between excessive sugar intake, diabetes, and cognitive impairment We discussed the Mediterranean diet, which has been shown to help patients reduce the risk of progressive memory disorders and reduces cardiovascular risk. This includes eating fish, eat fruits and green leafy vegetables, nuts like almonds and hazelnuts, walnuts, and also use olive oil. Avoid fast foods and fried foods as much as possible. Avoid sweets and sugar as sugar use has been linked to worsening of memory function.  There is always a concern of gradual progression of memory problems. If this is the case, then we may need to adjust level of care according to patient needs. Support, both to the patient and caregiver, should then be put into place.    FALL PRECAUTIONS: Be cautious when walking. Scan the area for obstacles that may increase the risk of trips and falls. When getting up in the mornings, sit up at the edge of the bed for a few minutes before getting out of bed. Consider elevating the bed at the head end to avoid drop of blood  pressure when getting up. Walk always in a well-lit room (use night lights in the walls). Avoid area rugs or power cords from appliances in the middle of the walkways. Use a walker or a cane if necessary and consider physical therapy for balance exercise. Get your eyesight checked regularly.  FINANCIAL  OVERSIGHT: Supervision, especially oversight when making financial decisions or transactions is also recommended.  HOME SAFETY: Consider the safety of the kitchen when operating appliances like stoves, microwave oven, and blender. Consider having supervision and share cooking responsibilities until no longer able to participate in those. Accidents with firearms and other hazards in the house should be identified and addressed as well.   ABILITY TO BE LEFT ALONE: If patient is unable to contact 911 operator, consider using LifeLine, or when the need is there, arrange for someone to stay with patients. Smoking is a fire hazard, consider supervision or cessation. Risk of wandering should be assessed by caregiver and if detected at any point, supervision and safe proof recommendations should be instituted.  MEDICATION SUPERVISION: Inability to self-administer medication needs to be constantly addressed. Implement a mechanism to ensure safe administration of the medications.   DRIVING: Regarding driving, in patients with progressive memory problems, driving will be impaired. We advise to have someone else do the driving if trouble finding directions or if minor accidents are reported. Independent driving assessment is available to determine safety of driving.   If you are interested in the driving assessment, you can contact the following:  [employer] in [place]  [place] [phone]  [place]  [facility] [phone] or [phone]

## 2021-08-05 NOTE — Progress Notes (Signed)
Assessment/Plan:    88 year old RH woman with a history of carotid artery stenosis status post CEA in 2020, hypertension, CKD, history left breast cancer status post left mastectomy, of right breast mass (declining studies), B12 deficiency receiving regular injections, history of cervicogenic headache, severe HOH, depression, last seen on 07/03/21 after a TIA on 4/3 in the form of L facial droop with complete resolution,  MRI at the time was negative for acute process, some DJD at C5-6 and C6-7 noted .  Today she presents in follow up for evaluation of memory loss and recent TIA.   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 She is not on antidementia medications.  Exam is unremarkable.  Labs were remarkable for low vitamin B12, for which she is on replenishment.  Patient is stable from the cognitive standpoint.  Follow up in  6 months. Continue B12 injections Start memantine 5 mg, take 1 tablet p.o. nightly for 2 weeks, then increase to 1 tab p.o. twice daily if tolerated.  Side effects discussed. (Mild bradycardia) Monitor blood pressure carefully.   S/P TIA on 06/13/21    No facial droop noted. Gait is with some chronic instability but she declines PT/OT at this time. MRI without acute findings, 2 D echo normal. Lipid pane was normal.  She declined carotid US. She is on ASA daily and statin.  Continue daily baby ASA Recommend close follow up on her cardiovascular risk factors. Recommend PT/OT   Case discussed with Dr. Delice Lesch who agrees with the plan     Subjective:    Natalie [name] is a very pleasant 88 y.o. RH female  seen today in follow up for memory loss. This patient is accompanied in the office by her daughter who supplements the history.  Previous records as well as any outside records available were reviewed prior to todays visit.  Patient was last seen at our office on July 03, 2021 at which time her MoCA was 13/30.  She is not on  antidementia medication at this time.  When she was initially seen, she had a recent TIA, for which it was felt prudent to revise her cognitive status in a later visit.   Any changes in memory since last visit?  Her daughter reports that short-term memory is the "worst part otherwise she remains sharp ".  Occasionally, she forgets important information such as a Arboriculturist.   Patient lives in adult living facility, enjoying all the activities that this facility offers.   Repeats oneself?  Not frequently Disoriented when walking into a room?  Patient denies   Leaving objects in unusual places?  Patient denies   Ambulates  with difficulty?   Patient uses a walker to ambulate.  Recent falls?  Patient denies   Any head injuries?  Patient denies   History of seizures?   Patient denies   Wandering behavior?  Patient denies   Patient drives?   Patient no longer drives    Any mood changes such irritability agitation?  Patient denies   Any history of depression?:  Patient denies.  "I am happy most of the time " Hallucinations?  Patient denies   Paranoia?  Patient denies   Patient reports that she wakes up at 4 am "because I go to sleep at 8 pm ".  Denies REM behavior or sleepwalking   History of sleep apnea?  Patient denies   Any hygiene concerns?  Patient denies  Independent of bathing and dressing?  Endorsed  Does the patient needs help with medications? She is in charge of her own meds, denies missing any doses  Who is in charge of the finances?  Patient is in charge, denies missing any bills     Any changes in appetite?  Patient denies   Patient have trouble swallowing? Patient has a history of GERD therefore  sometimes she has some discomfort    Does the patient cook?  Not often  Any kitchen accidents such as leaving the stove on? Patient denies, occasionally she uses a microwave. Any headaches?  Patient denies   The double vision? Patient denies   Any focal numbness or  tingling?  Patient denies   Chronic back pain Patient denies   Unilateral weakness?  Patient denies   Any tremors?  Patient denies   Any history of anosmia?  Patient denies   Any incontinence of urine?  Patient denies   Any bowel dysfunction? Sometimes she has constipation but she does have an appointment with GI for possible celiac disease  Initial TIA visit 07/06/21 Natalie [name] is a 88 y.o. R-handed female with a history of carotid artery stenosis status post CEA in 2020, hypertension, CKD, history left breast cancer status post left mastectomy, of right breast mass (declining studies), B12 deficiency receiving regular injections, history of TIA in 2002, insomnia, history of cervicogenic headache, presenting for evaluation of recent TIA.  In review, the patient had an episode of 06/13/2021 of right lip drooping associated with severe headache, self-limited, very brief, without recurrence.  EMS was called, however she adamantly declined further evaluation at the ED.  Per chart notes, vital signs were normal at the time, without one-sided weakness, or slurred speech.  She did have some unsteady balance, almost falling, when her daughter had to catch her before hitting the floor.  She may had a moment of confusion at the time, which per notes sounded like vascular dementia, but her daughter states that her memory is improved since that event.  The patient denies any vertigo, dizziness, or vision changes.  She denies dysphagia.  No loss of consciousness.  Denies seizures.  Denies any chest pain or shortness of breath, fever, chills or recent infections.  Occasionally she has night sweats.  She denies any tobacco since 2009.  No new medications or hormonal supplements.  She takes a regular aspirin a day, without any other antiplatelets or anticoagulants.  She denies any recent long distance trips or recent surgeries.  No sick contacts.  Denies any new stressors in personal life.  She is compliant with her  medications.  Patient is not very active, but fairly independent.  She uses a walker when she is outside.  She denies being a diabetic.  Family history remarkable for stroke in grandmother.  She has some memory difficulties for at least 3 years, and does repeat herself.   MRI brain 06/15/21  No acute intracranial process. Moderate atrophy and chronic SV ischemic disease. No etiology is seen for the patient's headache or left facial droop.2. Degenerative changes in the partially imaged cervical spine,concerning for moderate spinal canal stenosis at C3-C4. This could be further evaluated with a noncontrast MRI if clinically indicated  PREVIOUS MEDICATIONS:   CURRENT MEDICATIONS:  Outpatient Encounter Medications as of 08/05/2021  Medication Sig   amLODipine (NORVASC) 10 MG tablet TAKE 1 TABLET BY MOUTH EVERY DAY   aspirin EC 81 MG tablet Take 81 mg by mouth daily. In preparation  for surgery 12/18/ After stopping plavix   atorvastatin (LIPITOR) 40 MG tablet TAKE 1 TABLET BY MOUTH EVERY DAY   Calcium Citrate-Vitamin D 250-200 MG-UNIT TABS Take 250 mg by mouth every morning.   cholecalciferol (VITAMIN D3) 25 MCG (1000 UT) tablet Take 1,000 Units by mouth daily.    fluticasone (FLONASE) 50 MCG/ACT nasal spray Place 2 sprays into both nostrils daily.   ipratropium-albuterol (DUONEB) 0.5-2.5 (3) MG/3ML SOLN Take 3 mLs by nebulization 4 (four) times daily as needed.   lisinopril (ZESTRIL) 5 MG tablet TAKE 1 TABLET BY MOUTH EVERY DAY   metoprolol tartrate (LOPRESSOR) 50 MG tablet TAKE 1/2 TABLET BY MOUTH TWICE A DAY   Tiotropium Bromide Monohydrate 2.5 MCG/ACT AERS Inhale 2 puffs into the lungs daily.   traZODone (DESYREL) 50 MG tablet Take 0.5 tablets (25 mg total) by mouth at bedtime. (Patient taking differently: Take 25 mg by mouth at bedtime as needed.)   Facility-Administered Encounter Medications as of 08/05/2021  Medication   ipratropium-albuterol (DUONEB) 0.5-2.5 (3) MG/3ML nebulizer solution 3 mL         View : No data to display.            07/07/2021    7:00 AM  Montreal Cognitive Assessment   Visuospatial/ Executive (0/5) 0  Naming (0/3) 2  Attention: Read list of digits (0/2) 2  Attention: Read list of letters (0/1) 1  Attention: Serial 7 subtraction starting at 100 (0/3) 1  Language: Repeat phrase (0/2) 1  Language : Fluency (0/1) 0  Abstraction (0/2) 0  Delayed Recall (0/5) 2  Orientation (0/6) 4  Total 13  Adjusted Score (based on education) 13    Objective:     PHYSICAL EXAMINATION:    VITALS:   Vitals:   08/05/21 0858  BP: (!) 159/63  Pulse: (!) 56  Resp: 18  SpO2: 97%  Weight: 125 lb (56.7 kg)  Height: '5\' 5"'$  (1.651 m)    GEN:  The patient appears stated age and is in NAD. HEENT:  Normocephalic, atraumatic.   Neurological examination:  General: NAD, well-groomed, appears stated age. Orientation: The patient is alert. Oriented to person, place and not to date Cranial nerves: There is good facial symmetry.The speech is fluent and clear. No aphasia or dysarthria. Fund of knowledge is reduced. Recent and remote memory are impaired. Attention and concentration are reduced.  Able to name objects and repeat phrases.  Hearing is intact to conversational tone.    Sensation: Sensation is intact to light touch throughout Motor: Strength is at least antigravity x4. DTR's 2/4 in UE/LE     Movement examination: Tone: There is normal tone in the UE/LE Abnormal movements:  mild chronic, essential tremor.  No myoclonus.  No asterixis.   Coordination:  There is no decremation with RAM's. Normal finger to nose  Gait and Station: The patient has difficulty arising out of a deep-seated chair without the use of the hands. She uses a walker. The patient's stride length is short.  Gait is cautious and narrow.    Thank you for allowing [name] the opportunity to participate in the care of this nice patient. Please do not hesitate to contact us for any questions or  concerns.   Total time spent on today's visit was 32 minutes dedicated to this patient today, preparing to see patient, examining the patient, ordering tests and/or medications and counseling the patient, documenting clinical information in the EHR or other health record, independently interpreting results and communicating  results to the patient/family, discussing treatment and goals, answering patient's questions and coordinating care.  Cc:  [name], FNP  Sharene Butters 08/05/2021 9:23 AM

## 2021-08-09 ENCOUNTER — Encounter (INDEPENDENT_AMBULATORY_CARE_PROVIDER_SITE_OTHER): Payer: Self-pay | Admitting: Nurse Practitioner

## 2021-08-09 ENCOUNTER — Encounter: Payer: Self-pay | Admitting: Family

## 2021-08-09 DIAGNOSIS — F0390 Unspecified dementia without behavioral disturbance: Secondary | ICD-10-CM | POA: Insufficient documentation

## 2021-08-09 NOTE — Progress Notes (Signed)
Subjective:    Patient ID: Natalie [name], female    DOB: May 03, 1929, 88 y.o.   MRN: [id number] Chief Complaint  Patient presents with   Follow-up    Ultrasound follow up    The patient is seen for follow up evaluation of carotid stenosis. The carotid stenosis followed by ultrasound.   The patient denies amaurosis fugax. There is no recent history of TIA symptoms or focal motor deficits. There is no prior documented CVA.  The patient is taking enteric-coated aspirin 81 mg daily.  There is no history of migraine headaches. There is no history of seizures.  No recent shortening of the patient's walking distance or new symptoms consistent with claudication.  No history of rest pain symptoms. No new ulcers or wounds of the lower extremities have occurred.  There is no history of DVT, PE or superficial thrombophlebitis. No recent episodes of angina or shortness of breath documented.   Carotid Duplex done today shows <40% bilaterally.  No change compared to last study in 2022   Review of Systems  All other systems reviewed and are negative.     Objective:   Physical Exam Vitals reviewed.  HENT:     Head: Normocephalic.  Cardiovascular:     Rate and Rhythm: Normal rate.     Pulses:          Radial pulses are 1+ on the right side and 1+ on the left side.  Pulmonary:     Effort: Pulmonary effort is normal.  Skin:    General: Skin is warm and dry.  Neurological:     Mental Status: She is alert and oriented to person, place, and time.  Psychiatric:        Mood and Affect: Mood normal.        Behavior: Behavior normal.        Thought Content: Thought content normal.        Judgment: Judgment normal.    BP (!) 116/55 (BP Location: Right Arm)   Pulse (!) 59   Resp 15   Wt 123 lb 3.2 oz (55.9 kg)   BMI 20.50 kg/m   Past Medical History:  Diagnosis Date   Allergies    hay fever   Arthritis    Breast cancer, left ([place]) 2015   Left total mastectomy   COPD (chronic  obstructive pulmonary disease) (HCC)    GERD (gastroesophageal reflux disease)    Hyperlipemia    Hypertension    Stroke ([place]) 2002   TIA    Social History   Socioeconomic History   Marital status: Single    Spouse name: Not on file   Number of children: Not on file   Years of education: Not on file   Highest education level: Not on file  Occupational History   Occupation: CRNA    Comment: retired  Tobacco Use   Smoking status: Former   Smokeless tobacco: Never   Tobacco comments:    20 years ago  Electronics engineer Use   Vaping Use: Never used  Substance and Sexual Activity   Alcohol use: Yes    Comment: occassionally   Drug use: Never   Sexual activity: Not Currently  Other Topics Concern   Not on file  Social History Narrative   Patient lives with her daughter   Right handed'   2 sons   Social Determinants of Health   Financial Resource Strain: Low Risk    Difficulty of Paying Living Expenses: Not hard  at all  Food Insecurity: No Food Insecurity   Worried About Charity fundraiser in the Last Year: Never true   Ran Out of Food in the Last Year: Never true  Transportation Needs: No Transportation Needs   Lack of Transportation (Medical): No   Lack of Transportation (Non-Medical): No  Physical Activity: Insufficiently Active   Days of Exercise per Week: 6 days   Minutes of Exercise per Session: 20 min  Stress: No Stress Concern Present   Feeling of Stress : Not at all  Social Connections: Unknown   Frequency of Communication with Friends and Family: Not on file   Frequency of Social Gatherings with Friends and Family: More than three times a week   Attends Religious Services: Not on Higher education careers adviser of Clubs or Organizations: Not on file   Attends Archivist Meetings: Not on file   Marital Status: Not on file  Intimate Partner Violence: Not At Risk   Fear of Current or Ex-Partner: No   Emotionally Abused: No   Physically Abused: No   Sexually Abused:  No    Past Surgical History:  Procedure Laterality Date   BREAST BIOPSY  2014   BREAST LUMPECTOMY  2014   left side   ENDARTERECTOMY Left 02/28/2019   Procedure: ENDARTERECTOMY CAROTID;  Surgeon: Katha Cabal, MD;  Location: [facility] ORS;  Service: Vascular;  Laterality: Left;   EVACUATION OF CERVICAL HEMATOMA Left 02/28/2019   Procedure: EVACUATION OF HEMATOMA;  Surgeon: Katha Cabal, MD;  Location: [facility] ORS;  Service: Vascular;  Laterality: Left;   EYE SURGERY Bilateral    cataract extractions   JOINT REPLACEMENT     MASTECTOMY Left May 9th 2017   RHINOPLASTY  1956   TONSILLECTOMY  1939   TOTAL HIP ARTHROPLASTY Right    head of the femur replaced due to fracture    Family History  Problem Relation Age of Onset   Breast cancer Mother    Lung cancer Father     Allergies  Allergen Reactions   Influenza Vaccines Shortness Of Breath       Latest Ref Rng & Units 06/15/2021   11:43 AM 03/03/2021    7:34 AM 11/16/2020    4:00 PM  CBC  WBC 4.0 - 10.5 K/uL 7.3   5.7   9.2    Hemoglobin 12.0 - 15.0 g/dL 12.5   12.8   12.6    Hematocrit 36.0 - 46.0 % 37.1   38.0   36.0    Platelets 150.0 - 400.0 K/uL 179.0   180.0   216        CMP     Component Value Date/Time   NA 137 06/15/2021 1143   K 4.6 06/15/2021 1143   CL 102 06/15/2021 1143   CO2 25 06/15/2021 1143   GLUCOSE 113 (H) 06/15/2021 1143   BUN 28 (H) 06/15/2021 1143   CREATININE 1.17 06/15/2021 1143   CALCIUM 10.1 06/15/2021 1143   PROT 7.0 06/15/2021 1143   ALBUMIN 4.4 06/15/2021 1143   AST 21 06/15/2021 1143   ALT 17 06/15/2021 1143   ALKPHOS 55 06/15/2021 1143   BILITOT 0.6 06/15/2021 1143   GFRNONAA 48 (L) 11/16/2020 1600   GFRAA >60 03/03/2019 0224     No results found.     Assessment & Plan:   1. Bilateral carotid artery stenosis Recommend:  Given the patient's asymptomatic subcritical stenosis no further invasive testing or surgery at this time.  Duplex ultrasound shows <40% stenosis  bilaterally.  Continue antiplatelet therapy as prescribed Continue management of CAD, HTN and Hyperlipidemia Healthy heart diet,  encouraged exercise at least 4 times per week Follow up in 12 months with duplex ultrasound and physical exam   - VAS US CAROTID  2. Essential hypertension Continue antihypertensive medications as already ordered, these medications have been reviewed and there are no changes at this time.   3. Hyperlipidemia, unspecified hyperlipidemia type Continue statin as ordered and reviewed, no changes at this time    Current Outpatient Medications on File Prior to Visit  Medication Sig Dispense Refill   amLODipine (NORVASC) 10 MG tablet TAKE 1 TABLET BY MOUTH EVERY DAY 90 tablet 1   aspirin EC 81 MG tablet Take 81 mg by mouth daily. In preparation for surgery 12/18/ After stopping plavix     atorvastatin (LIPITOR) 40 MG tablet TAKE 1 TABLET BY MOUTH EVERY DAY 90 tablet 3   Calcium Citrate-Vitamin D 250-200 MG-UNIT TABS Take 250 mg by mouth every morning.     cholecalciferol (VITAMIN D3) 25 MCG (1000 UT) tablet Take 1,000 Units by mouth daily.      fluticasone (FLONASE) 50 MCG/ACT nasal spray Place 2 sprays into both nostrils daily.     ipratropium-albuterol (DUONEB) 0.5-2.5 (3) MG/3ML SOLN Take 3 mLs by nebulization 4 (four) times daily as needed. 360 mL 3   lisinopril (ZESTRIL) 5 MG tablet TAKE 1 TABLET BY MOUTH EVERY DAY 90 tablet 0   metoprolol tartrate (LOPRESSOR) 50 MG tablet TAKE 1/2 TABLET BY MOUTH TWICE A DAY 90 tablet 0   Tiotropium Bromide Monohydrate 2.5 MCG/ACT AERS Inhale 2 puffs into the lungs daily. 4 g 3   traZODone (DESYREL) 50 MG tablet Take 0.5 tablets (25 mg total) by mouth at bedtime. (Patient taking differently: Take 25 mg by mouth at bedtime as needed.) 45 tablet 3   Current Facility-Administered Medications on File Prior to Visit  Medication Dose Route Frequency Provider Last Rate Last Admin   ipratropium-albuterol (DUONEB) 0.5-2.5 (3) MG/3ML  nebulizer solution 3 mL  3 mL Nebulization Once [name], FNP        There are no Patient Instructions on file for this visit. No follow-ups on file.   Kris Hartmann, NP

## 2021-08-22 ENCOUNTER — Inpatient Hospital Stay
Admission: EM | Admit: 2021-08-22 | Discharge: 2021-08-25 | DRG: 536 | Disposition: A | Discharge status: Skilled Nursing Facility | Payer: Medicare Other | Attending: Internal Medicine | Admitting: Internal Medicine

## 2021-08-22 ENCOUNTER — Telehealth: Payer: Self-pay | Admitting: Family

## 2021-08-22 ENCOUNTER — Emergency Department: Payer: Medicare Other

## 2021-08-22 ENCOUNTER — Ambulatory Visit: Payer: Medicare Other

## 2021-08-22 DIAGNOSIS — S300XXA Contusion of lower back and pelvis, initial encounter: Secondary | ICD-10-CM | POA: Diagnosis not present

## 2021-08-22 DIAGNOSIS — I110 Hypertensive heart disease with heart failure: Secondary | ICD-10-CM | POA: Diagnosis not present

## 2021-08-22 DIAGNOSIS — Z79899 Other long term (current) drug therapy: Secondary | ICD-10-CM

## 2021-08-22 DIAGNOSIS — K219 Gastro-esophageal reflux disease without esophagitis: Secondary | ICD-10-CM | POA: Diagnosis not present

## 2021-08-22 DIAGNOSIS — S32592D Other specified fracture of left pubis, subsequent encounter for fracture with routine healing: Secondary | ICD-10-CM | POA: Diagnosis not present

## 2021-08-22 DIAGNOSIS — Z043 Encounter for examination and observation following other accident: Secondary | ICD-10-CM | POA: Diagnosis not present

## 2021-08-22 DIAGNOSIS — Z96641 Presence of right artificial hip joint: Secondary | ICD-10-CM | POA: Diagnosis not present

## 2021-08-22 DIAGNOSIS — F320 Major depressive disorder, single episode, mild: Secondary | ICD-10-CM | POA: Diagnosis not present

## 2021-08-22 DIAGNOSIS — Z7982 Long term (current) use of aspirin: Secondary | ICD-10-CM

## 2021-08-22 DIAGNOSIS — Z87891 Personal history of nicotine dependence: Secondary | ICD-10-CM | POA: Diagnosis not present

## 2021-08-22 DIAGNOSIS — E785 Hyperlipidemia, unspecified: Secondary | ICD-10-CM | POA: Diagnosis present

## 2021-08-22 DIAGNOSIS — S32512A Fracture of superior rim of left pubis, initial encounter for closed fracture: Secondary | ICD-10-CM | POA: Diagnosis not present

## 2021-08-22 DIAGNOSIS — K449 Diaphragmatic hernia without obstruction or gangrene: Secondary | ICD-10-CM | POA: Diagnosis not present

## 2021-08-22 DIAGNOSIS — J439 Emphysema, unspecified: Secondary | ICD-10-CM | POA: Diagnosis not present

## 2021-08-22 DIAGNOSIS — Z9012 Acquired absence of left breast and nipple: Secondary | ICD-10-CM | POA: Diagnosis not present

## 2021-08-22 DIAGNOSIS — J449 Chronic obstructive pulmonary disease, unspecified: Secondary | ICD-10-CM | POA: Diagnosis present

## 2021-08-22 DIAGNOSIS — I700 Atherosclerosis of aorta: Secondary | ICD-10-CM | POA: Diagnosis not present

## 2021-08-22 DIAGNOSIS — W19XXXA Unspecified fall, initial encounter: Secondary | ICD-10-CM | POA: Diagnosis not present

## 2021-08-22 DIAGNOSIS — R0902 Hypoxemia: Secondary | ICD-10-CM | POA: Diagnosis not present

## 2021-08-22 DIAGNOSIS — W19XXXD Unspecified fall, subsequent encounter: Secondary | ICD-10-CM | POA: Diagnosis not present

## 2021-08-22 DIAGNOSIS — E161 Other hypoglycemia: Secondary | ICD-10-CM | POA: Diagnosis not present

## 2021-08-22 DIAGNOSIS — Z803 Family history of malignant neoplasm of breast: Secondary | ICD-10-CM

## 2021-08-22 DIAGNOSIS — I10 Essential (primary) hypertension: Secondary | ICD-10-CM | POA: Diagnosis not present

## 2021-08-22 DIAGNOSIS — Z887 Allergy status to serum and vaccine status: Secondary | ICD-10-CM

## 2021-08-22 DIAGNOSIS — M1990 Unspecified osteoarthritis, unspecified site: Secondary | ICD-10-CM | POA: Diagnosis not present

## 2021-08-22 DIAGNOSIS — Z79891 Long term (current) use of opiate analgesic: Secondary | ICD-10-CM

## 2021-08-22 DIAGNOSIS — J309 Allergic rhinitis, unspecified: Secondary | ICD-10-CM | POA: Diagnosis not present

## 2021-08-22 DIAGNOSIS — E559 Vitamin D deficiency, unspecified: Secondary | ICD-10-CM | POA: Diagnosis not present

## 2021-08-22 DIAGNOSIS — I5032 Chronic diastolic (congestive) heart failure: Secondary | ICD-10-CM | POA: Diagnosis present

## 2021-08-22 DIAGNOSIS — R1311 Dysphagia, oral phase: Secondary | ICD-10-CM | POA: Diagnosis not present

## 2021-08-22 DIAGNOSIS — D62 Acute posthemorrhagic anemia: Secondary | ICD-10-CM | POA: Diagnosis not present

## 2021-08-22 DIAGNOSIS — E7800 Pure hypercholesterolemia, unspecified: Secondary | ICD-10-CM | POA: Diagnosis not present

## 2021-08-22 DIAGNOSIS — T148XXA Other injury of unspecified body region, initial encounter: Secondary | ICD-10-CM | POA: Diagnosis present

## 2021-08-22 DIAGNOSIS — S3722XA Contusion of bladder, initial encounter: Secondary | ICD-10-CM

## 2021-08-22 DIAGNOSIS — E162 Hypoglycemia, unspecified: Secondary | ICD-10-CM | POA: Diagnosis not present

## 2021-08-22 DIAGNOSIS — M25552 Pain in left hip: Secondary | ICD-10-CM | POA: Diagnosis not present

## 2021-08-22 DIAGNOSIS — S3289XA Fracture of other parts of pelvis, initial encounter for closed fracture: Secondary | ICD-10-CM | POA: Diagnosis not present

## 2021-08-22 DIAGNOSIS — Z853 Personal history of malignant neoplasm of breast: Secondary | ICD-10-CM

## 2021-08-22 DIAGNOSIS — S32592A Other specified fracture of left pubis, initial encounter for closed fracture: Secondary | ICD-10-CM | POA: Diagnosis present

## 2021-08-22 DIAGNOSIS — W010XXA Fall on same level from slipping, tripping and stumbling without subsequent striking against object, initial encounter: Secondary | ICD-10-CM | POA: Diagnosis present

## 2021-08-22 DIAGNOSIS — Z0000 Encounter for general adult medical examination without abnormal findings: Secondary | ICD-10-CM

## 2021-08-22 DIAGNOSIS — F0390 Unspecified dementia without behavioral disturbance: Secondary | ICD-10-CM | POA: Diagnosis present

## 2021-08-22 DIAGNOSIS — Z8673 Personal history of transient ischemic attack (TIA), and cerebral infarction without residual deficits: Secondary | ICD-10-CM

## 2021-08-22 DIAGNOSIS — E538 Deficiency of other specified B group vitamins: Secondary | ICD-10-CM | POA: Diagnosis not present

## 2021-08-22 DIAGNOSIS — Z743 Need for continuous supervision: Secondary | ICD-10-CM | POA: Diagnosis not present

## 2021-08-22 DIAGNOSIS — F0393 Unspecified dementia, unspecified severity, with mood disturbance: Secondary | ICD-10-CM | POA: Diagnosis not present

## 2021-08-22 DIAGNOSIS — M4312 Spondylolisthesis, cervical region: Secondary | ICD-10-CM | POA: Diagnosis not present

## 2021-08-22 DIAGNOSIS — S329XXA Fracture of unspecified parts of lumbosacral spine and pelvis, initial encounter for closed fracture: Secondary | ICD-10-CM

## 2021-08-22 LAB — COMPREHENSIVE METABOLIC PANEL
ALT: 19 U/L (ref 0–44)
AST: 23 U/L (ref 15–41)
Albumin: 4.1 g/dL (ref 3.5–5.0)
Alkaline Phosphatase: 55 U/L (ref 38–126)
Anion gap: 5 (ref 5–15)
BUN: 24 mg/dL — ABNORMAL HIGH (ref 8–23)
CO2: 25 mmol/L (ref 22–32)
Calcium: 9.8 mg/dL (ref 8.9–10.3)
Chloride: 107 mmol/L (ref 98–111)
Creatinine, Ser: 0.93 mg/dL (ref 0.44–1.00)
GFR, Estimated: 58 mL/min — ABNORMAL LOW (ref >60)
Glucose, Bld: 134 mg/dL — ABNORMAL HIGH (ref 70–99)
Potassium: 4.1 mmol/L (ref 3.5–5.1)
Sodium: 137 mmol/L (ref 135–145)
Total Bilirubin: 1 mg/dL (ref 0.3–1.2)
Total Protein: 7.2 g/dL (ref 6.5–8.1)

## 2021-08-22 LAB — CBC WITH DIFFERENTIAL/PLATELET
Abs Immature Granulocytes: 0.05 10*3/uL (ref 0.00–0.07)
Basophils Absolute: 0 10*3/uL (ref 0.0–0.1)
Basophils Relative: 0 %
Eosinophils Absolute: 0 10*3/uL (ref 0.0–0.5)
Eosinophils Relative: 0 %
HCT: 31.8 % — ABNORMAL LOW (ref 36.0–46.0)
Hemoglobin: 10.6 g/dL — ABNORMAL LOW (ref 12.0–15.0)
Immature Granulocytes: 0 %
Lymphocytes Relative: 6 %
Lymphs Abs: 0.7 10*3/uL (ref 0.7–4.0)
MCH: 31 pg (ref 26.0–34.0)
MCHC: 33.3 g/dL (ref 30.0–36.0)
MCV: 93 fL (ref 80.0–100.0)
Monocytes Absolute: 0.9 10*3/uL (ref 0.1–1.0)
Monocytes Relative: 8 %
Neutro Abs: 9.6 10*3/uL — ABNORMAL HIGH (ref 1.7–7.7)
Neutrophils Relative %: 86 %
Platelets: 160 10*3/uL (ref 150–400)
RBC: 3.42 MIL/uL — ABNORMAL LOW (ref 3.87–5.11)
RDW: 12.7 % (ref 11.5–15.5)
WBC: 11.2 10*3/uL — ABNORMAL HIGH (ref 4.0–10.5)
nRBC: 0 % (ref 0.0–0.2)

## 2021-08-22 LAB — APTT: aPTT: 30 seconds (ref 24–36)

## 2021-08-22 LAB — TYPE AND SCREEN
ABO/RH(D): A POS
Antibody Screen: NEGATIVE

## 2021-08-22 LAB — PROTIME-INR
INR: 1.1 (ref 0.8–1.2)
Prothrombin Time: 14.4 seconds (ref 11.4–15.2)

## 2021-08-22 IMAGING — CT CT CTA ABD/PEL W/CM AND/OR W/O CM
2 of 12 series · 11 of 46 positions shown, 15 images · IV contrast (APPLIED)
Comparison: Pelvic CT dated [DATE].

CLINICAL DATA: Pelvic fracture.  Rule out arterial bleed.

EXAM:
CTA ABDOMEN AND PELVIS WITHOUT AND WITH CONTRAST
TECHNIQUE: Multidetector CT imaging of the abdomen and pelvis was performed
using the standard protocol during bolus administration of
intravenous contrast. Multiplanar reconstructed images and MIPs were
obtained and reviewed to evaluate the vascular anatomy.

[Series 7: (person_name) · coronal · 0.74mm/px · 2 of 110 slices shown]
[im 37/110  soft-tissue]
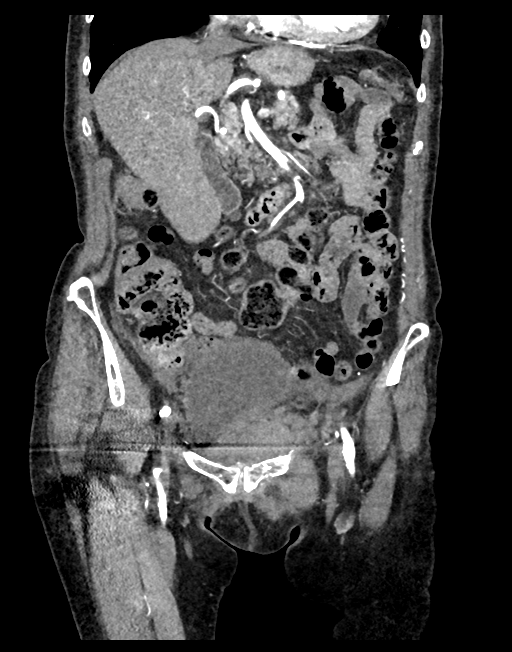
[im 73/110  soft-tissue]
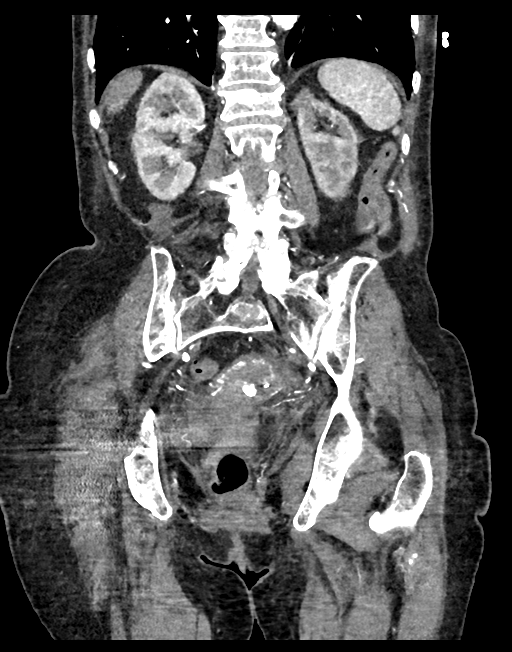

[Series 12: venous thins (person_name) · axial · portal-venous · 0.73mm/px · z∈[-1347,-959]mm · 9 of 1163 slices shown, 13 images]
[im 97/1163  soft-tissue]
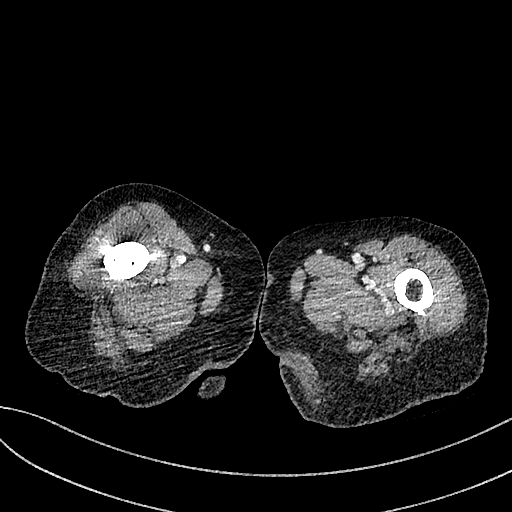
[im 97/1163  bone]
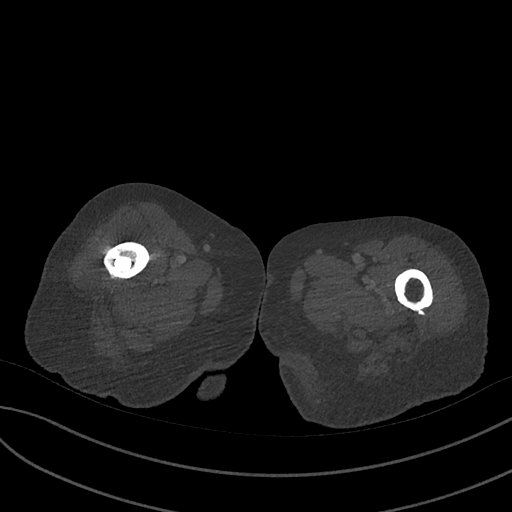
[im 291/1163  soft-tissue]
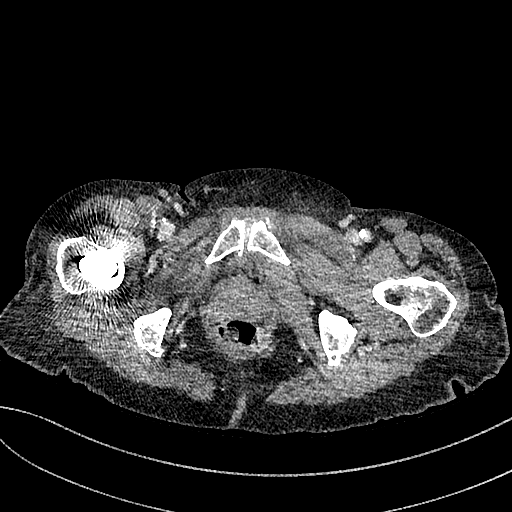
[im 388/1163  soft-tissue]
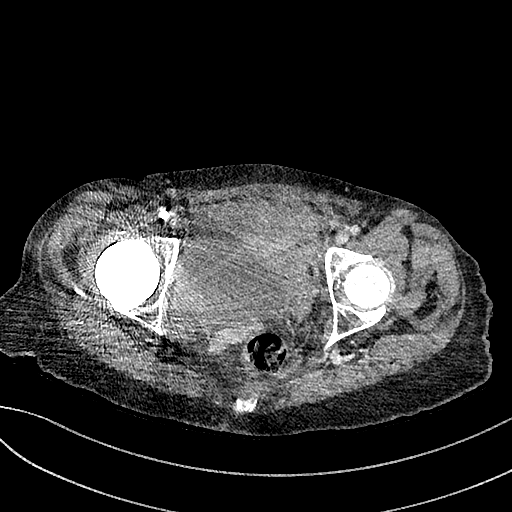
[im 485/1163  soft-tissue]
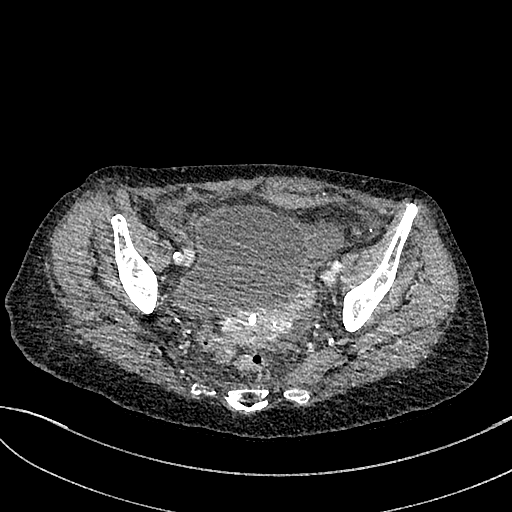
[im 678/1163  soft-tissue]
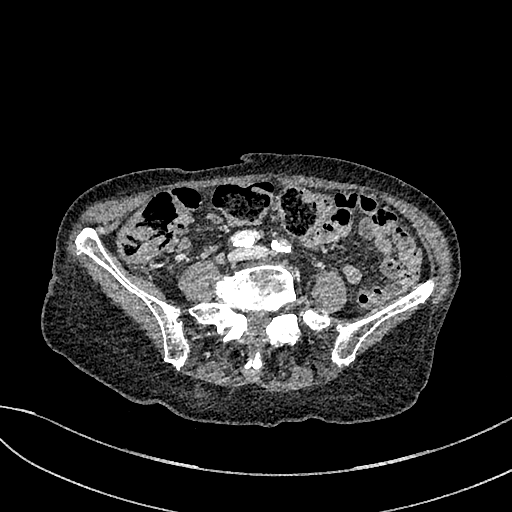
[im 775/1163  soft-tissue]
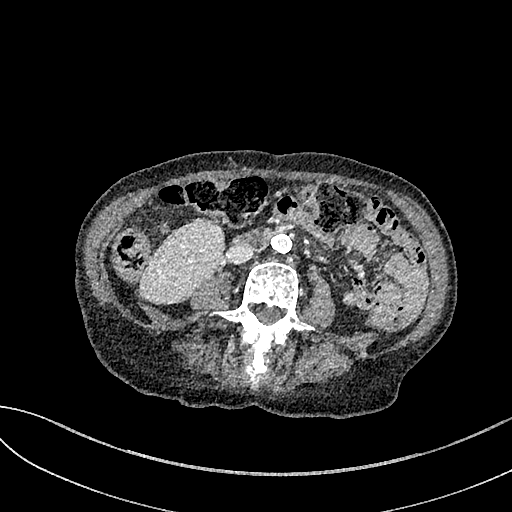
[im 775/1163  lung]
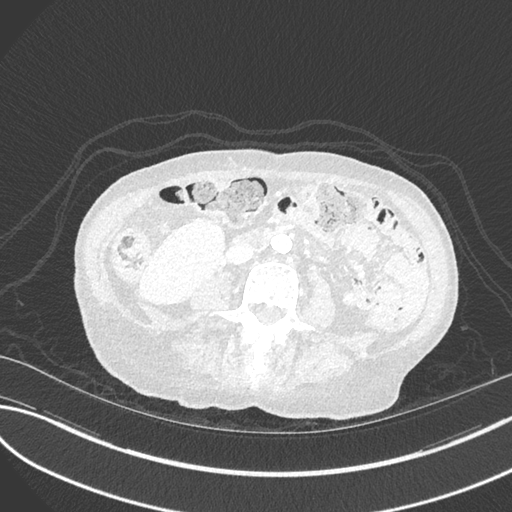
[im 872/1163  soft-tissue]
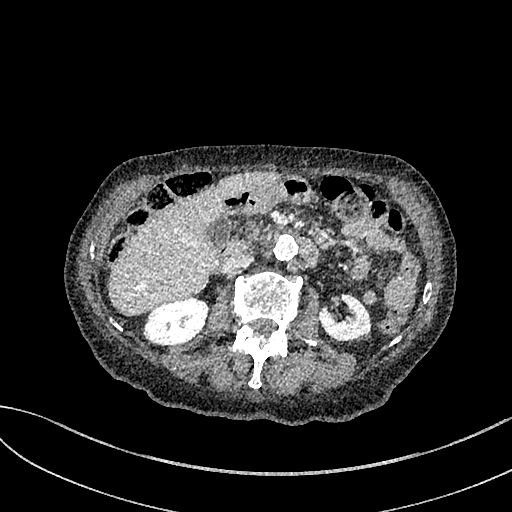
[im 872/1163  lung]
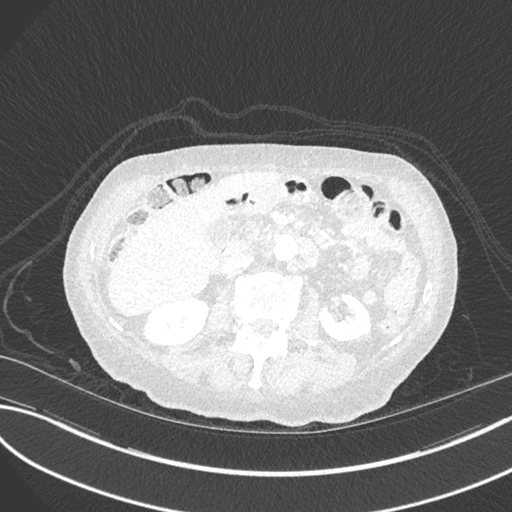
[im 969/1163  lung]
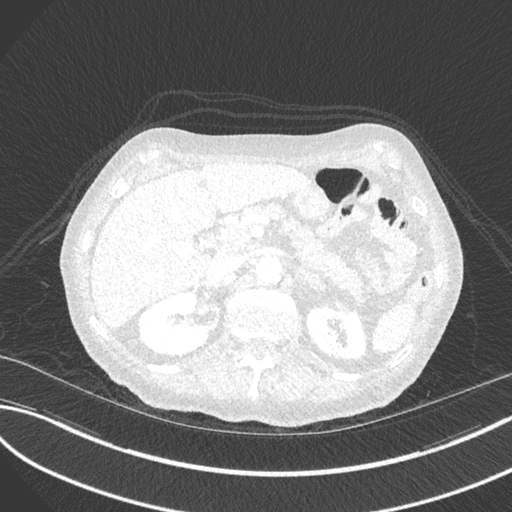
[im 1066/1163  soft-tissue]
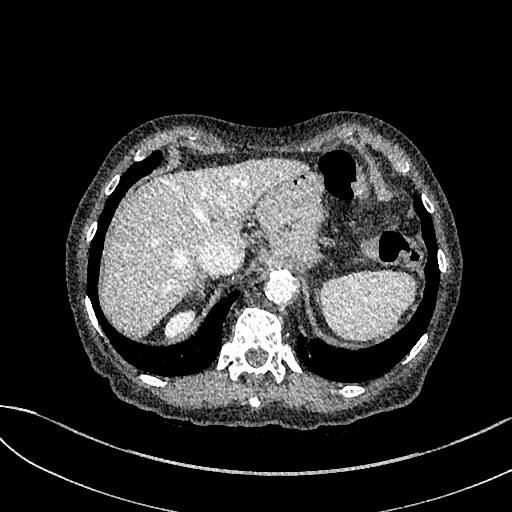
[im 1066/1163  lung]
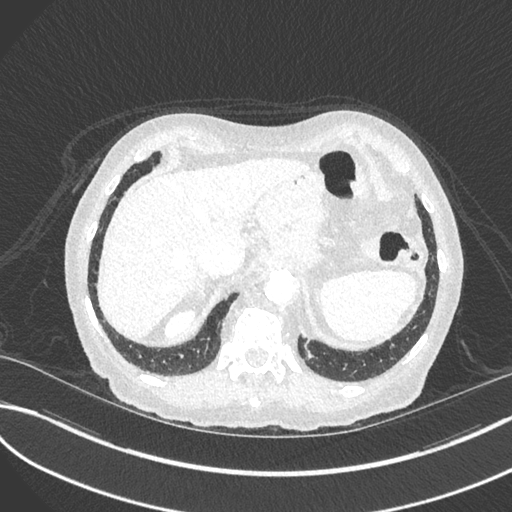

[11 of 46 positions shown; findings below may reference images not displayed]

RADIATION DOSE REDUCTION: This exam was performed according to the
departmental dose-optimization program which includes automated
exposure control, adjustment of the mA and/or kV according to
patient size and/or use of iterative reconstruction technique.

CONTRAST:  75mL OMNIPAQUE IOHEXOL 350 MG/ML SOLN
FINDINGS: VASCULAR

Aorta: Advanced atherosclerotic calcification of the aorta. No
aneurysmal dilatation or dissection. No periaortic fluid collection.

Celiac: The celiac trunk and its major branches appear patent.

SMA: Atherosclerotic calcification of the SMA.  The SMA is patent.

Renals: Atherosclerotic calcification of the renal arteries. Focal
high-grade narrowing of the right renal artery. The renal arteries
are patent.

IMA: The IMA is patent.

Inflow: Advanced atherosclerotic calcification of the iliac
arteries. There is chronic thrombus and scarring of the proximal
left common iliac artery with reconstitution of flow. The external
iliac arteries are patent bilaterally.

Proximal Outflow: Atherosclerotic calcification. The visualized
proximal outflow are patent.

Veins: The IVC is unremarkable.  No portal venous gas.

Review of the MIP images confirms the above findings.

NON-VASCULAR

Lower chest: Bibasilar linear atelectasis/scarring. Partially
visualized traced pericardial effusion anterior to the heart.

No intra-abdominal free air or free fluid.

Hepatobiliary: The liver is unremarkable. The gallbladder is
unremarkable.

Pancreas: There is a 1.4 cm hypodense lesion in the head of the
pancreas, not characterized on this CT but may represent a side
branch IPMN. This can be better evaluated with pancreatic protocol
MRI on a nonemergent/outpatient basis. No acute inflammatory
changes.

Spleen: Normal in size without focal abnormality.

Adrenals/Urinary Tract: Mild bilateral adrenal
thickening/hyperplasia. There is a 2.5 x 2.3 cm exophytic lesion
from the superior pole of the left kidney which is indeterminate and
not characterized on this CT. Further initial characterization with
ultrasound on a nonemergent/outpatient basis recommended. MRI may
provide additional evaluation depending on ultrasound findings.
There is no hydronephrosis on either side. The visualized ureters
appear unremarkable. Mild thickened and trabeculated appearance of
the bladder wall.

Stomach/Bowel: Small hiatal hernia. There is moderate stool
throughout the colon. No bowel obstruction or active inflammation.
The appendix is not visualized with certainty. No inflammatory
changes identified in the right lower quadrant.

Lymphatic: No adenopathy.

Reproductive: The uterus is anteverted. Small calcified fibroid. No
obvious adnexal masses.

Other: Left pelvic hematoma similar to prior CT with associated mild
mass effect on the bladder. No extravasation of contrast or evidence
of active bleed.

Musculoskeletal: Osteopenia with degenerative changes of the spine.
Total right hip arthroplasty with associated streak artifact
limiting evaluation of the pelvic structure. Acute left pubic bone
fractures as seen on the earlier CT. Old-appearing compression
fracture of superior endplate of L1.
IMPRESSION: 1. Left pelvic hematoma similar to prior CT with associated mild
mass effect on the bladder. No extravasation of contrast or evidence
of active bleed.
2. Advanced atherosclerotic disease of the abdominal aorta and its
branches as described. There is chronic thrombus and scarring of the
proximal left common iliac artery with reconstitution of flow.
3. Indeterminate pancreatic and left renal lesions. Further
characterization with MRI without and with contrast on a
nonemergent/outpatient basis recommended.

## 2021-08-22 IMAGING — CT CT PELVIS W/O CM
2 of 5 series · 16 of 46 positions shown, 19 images · non-contrast
Comparison: None Available.

CLINICAL DATA: Left hip pain

EXAM:
CT PELVIS WITHOUT CONTRAST
TECHNIQUE: Multidetector CT imaging of the pelvis was performed following the
standard protocol without intravenous contrast.
RADIATION DOSE REDUCTION: This exam was performed according to the
departmental dose-optimization program which includes automated
exposure control, adjustment of the mA and/or kV according to
patient size and/or use of iterative reconstruction technique.

[Series 3: soft tissue (person_name) · axial · 0.70mm/px · z∈[-886,-576]mm · 13 of 70 slices shown, 16 images]
[im 5/70  soft-tissue]
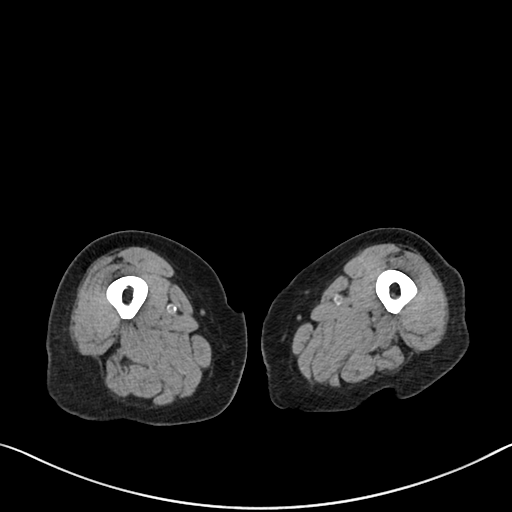
[im 5/70  bone]
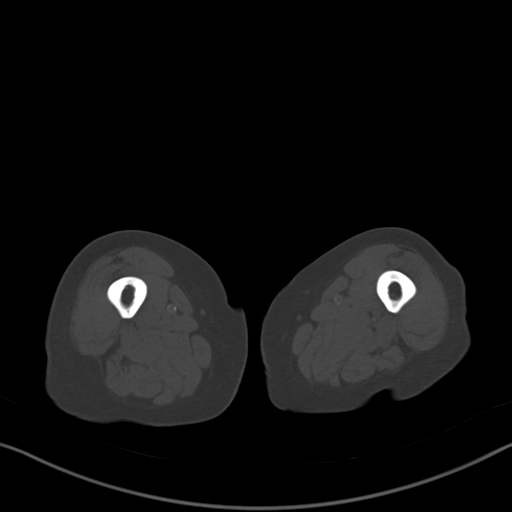
[im 12/70  soft-tissue]
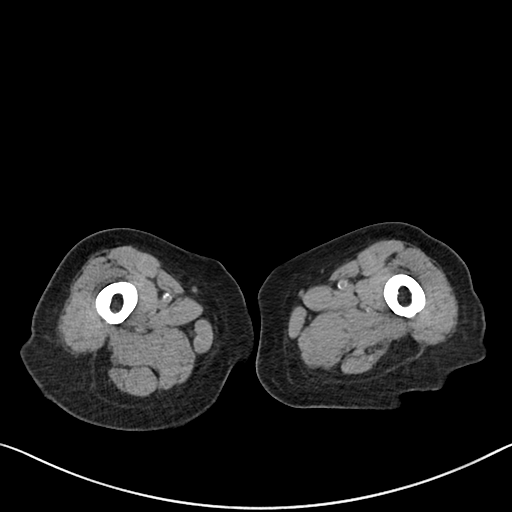
[im 18/70  soft-tissue]
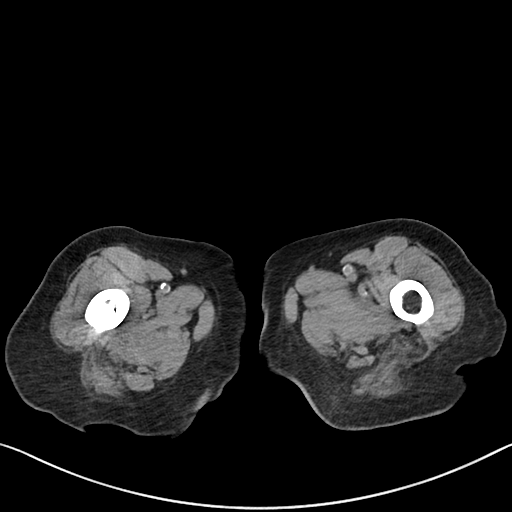
[im 25/70  soft-tissue]
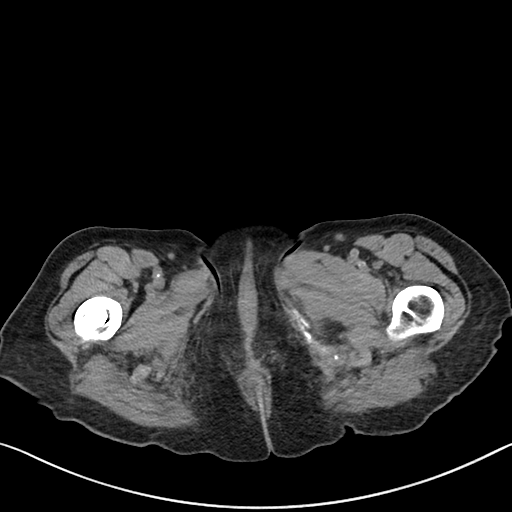
[im 32/70  soft-tissue]
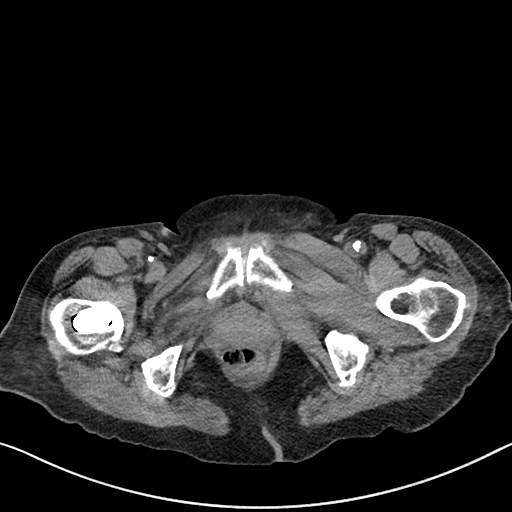
[im 38/70  soft-tissue]
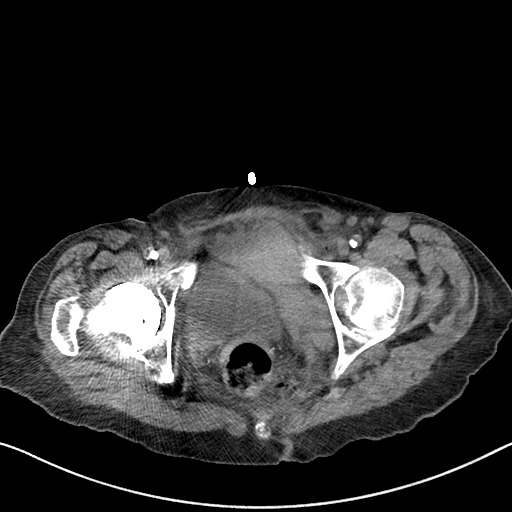
[im 45/70  soft-tissue]
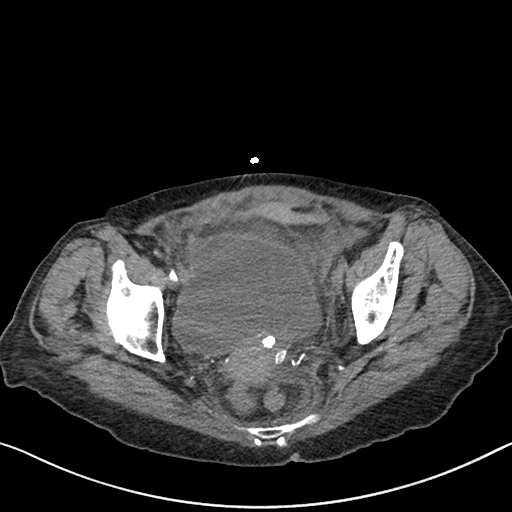
[im 52/70  soft-tissue]
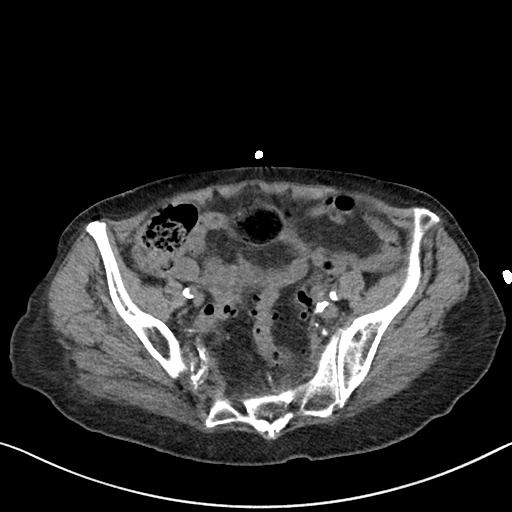
[im 58/70  soft-tissue]
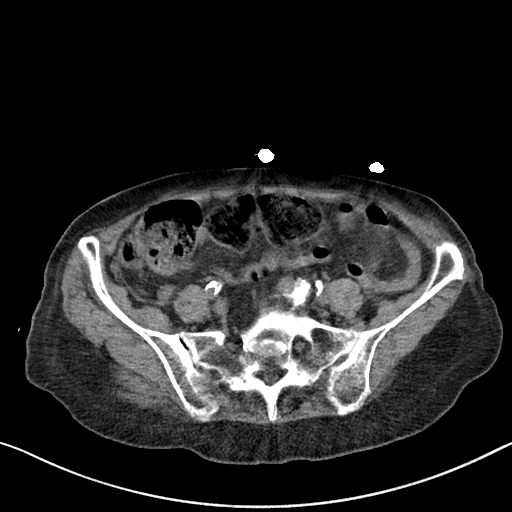
[im 58/70  bone]
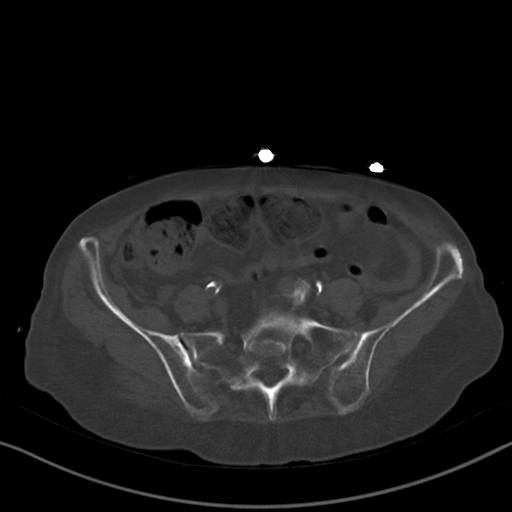
[im 61/70  lung]
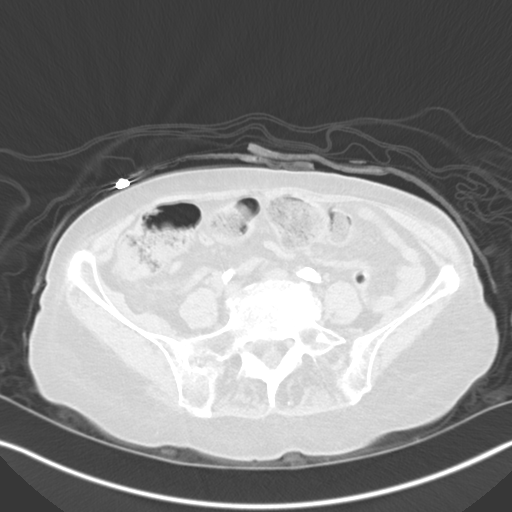
[im 63/70  lung]
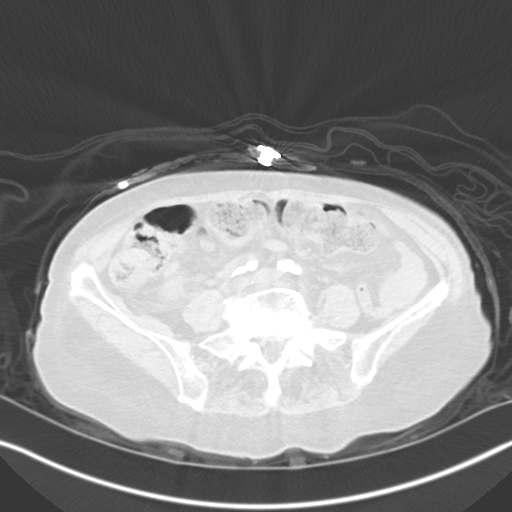
[im 65/70  soft-tissue]
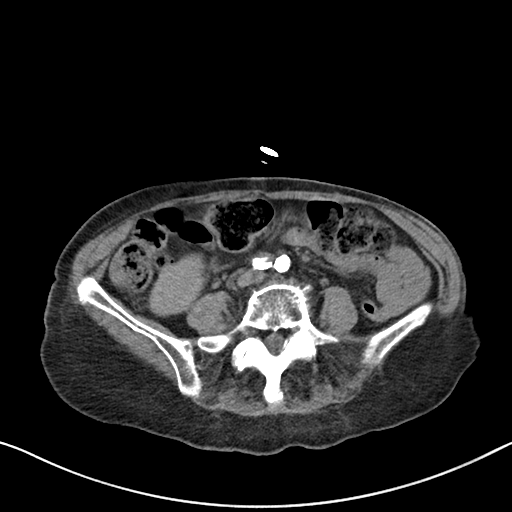
[im 65/70  lung]
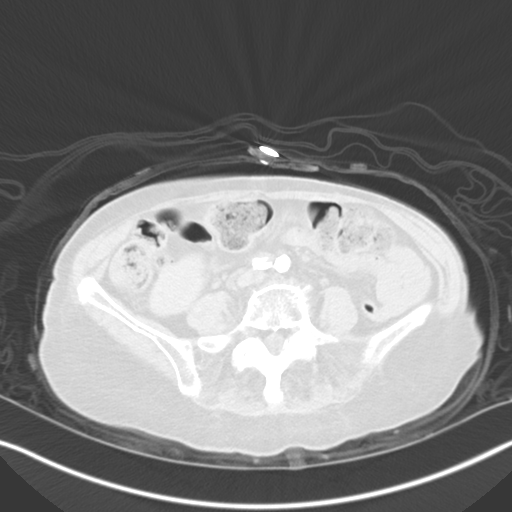
[im 67/70  lung]
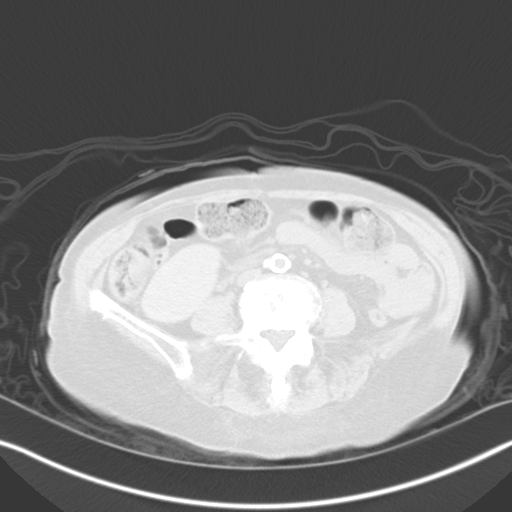

[Series 7: (person_name) · coronal · 0.70mm/px · 3 of 102 slices shown]
[im 26/102  soft-tissue]
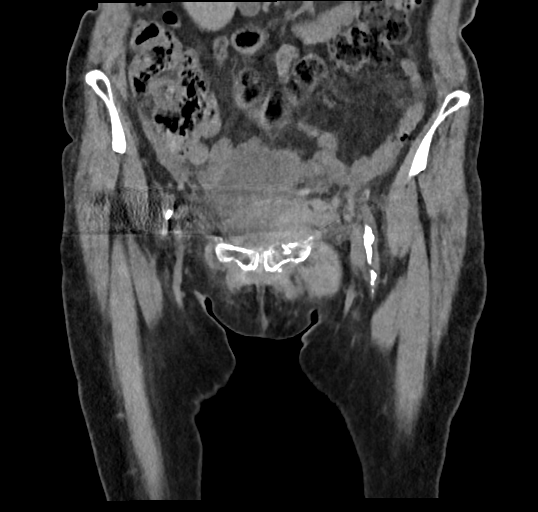
[im 51/102  soft-tissue]
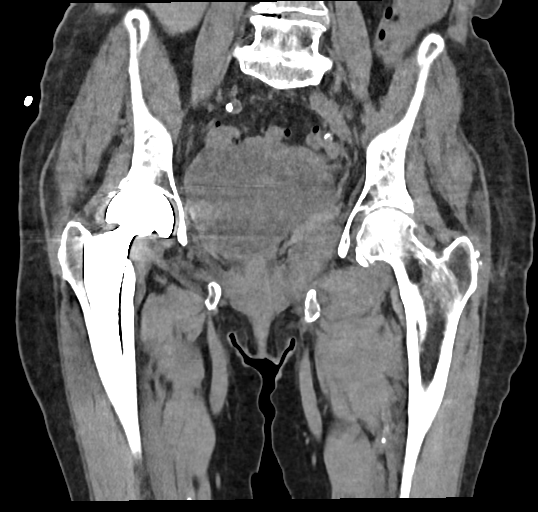
[im 76/102  soft-tissue]
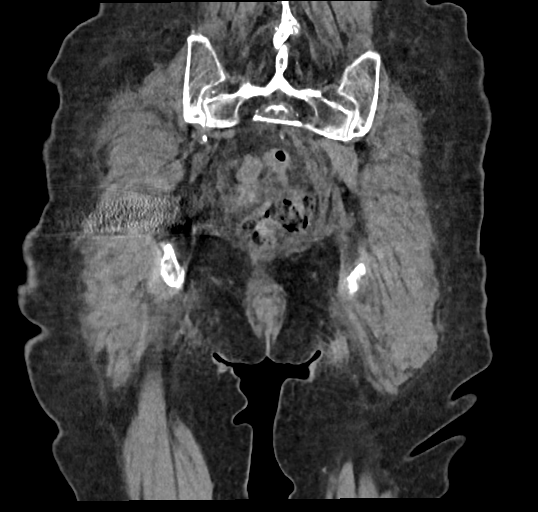

[16 of 46 positions shown; findings below may reference images not displayed]

FINDINGS: Bones/Joint/Cartilage

Generalized osteopenia.

No left hip fracture or dislocation.

Mildly comminuted fracture of the left superior and inferior pubic
rami without significant displacement. Large perivesicular hematoma
adjacent to the left anterolateral bladder extending cephalad.

Right total hip arthroplasty without hardware failure or
complication.

Severe degenerative disease with disc height loss at L5-S1.
Degenerative disease with disc height loss at L4-5. Normal
alignment. No joint effusion.

Ligaments

Ligaments are suboptimally evaluated by CT.

Muscles and Tendons
Muscles are normal. No muscle atrophy. No intramuscular fluid
collection or hematoma.

Soft tissue
No other fluid collection or hematoma. No soft tissue mass.
Abdominal aortic atherosclerosis.
IMPRESSION: 1. Mildly comminuted fracture of the left superior and inferior
pubic rami without significant displacement.
Mildly comminuted fracture of the left superior and inferior pubic
rami without significant displacement. Large perivesicular hematoma
adjacent to the left anterolateral bladder extending cephalad.

## 2021-08-22 IMAGING — CT CT CERVICAL SPINE W/O CM
3 of 4 series · 10 of 33 positions shown, 11 images · non-contrast
Comparison: Brain MRI [DATE]

CLINICAL DATA: Fall



[Series 6: sag bone · sagittal · 0.22mm/px · 5 of 56 slices shown]
[im 19/56  bone]
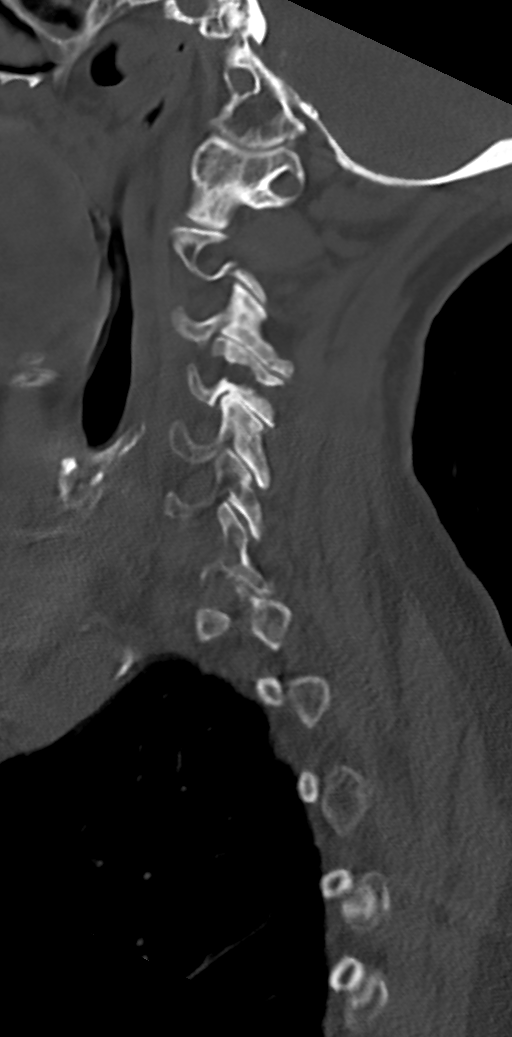
[im 23/56  bone]
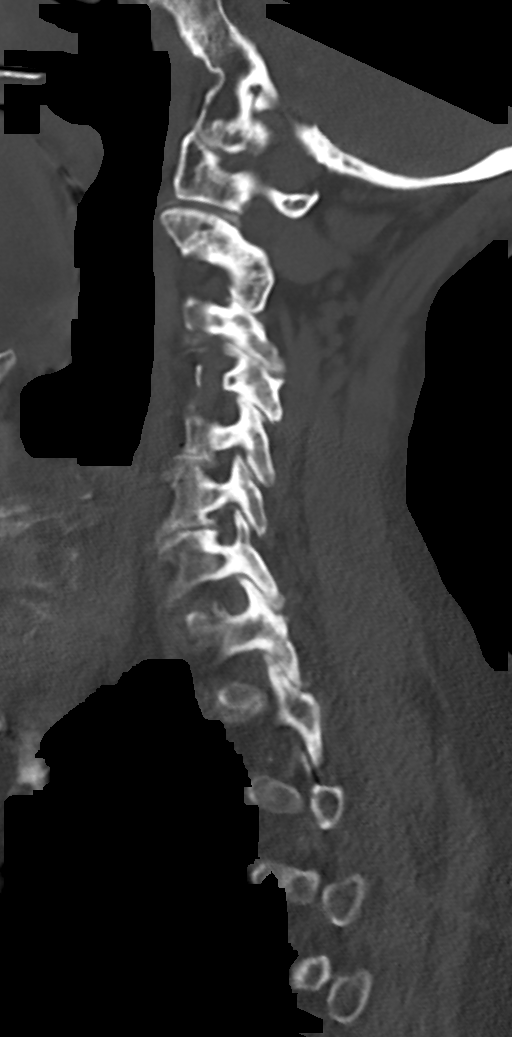
[im 28/56  bone]
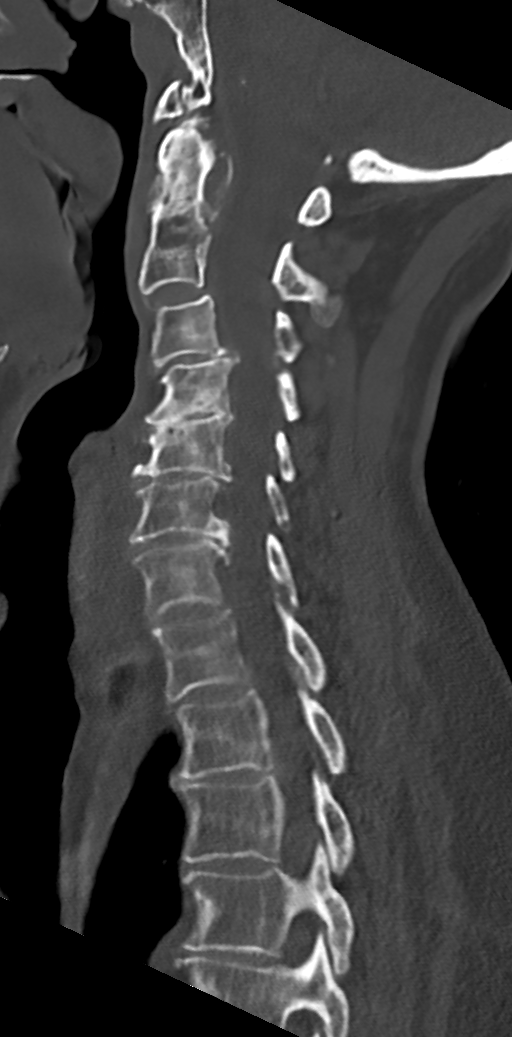
[im 33/56  bone]
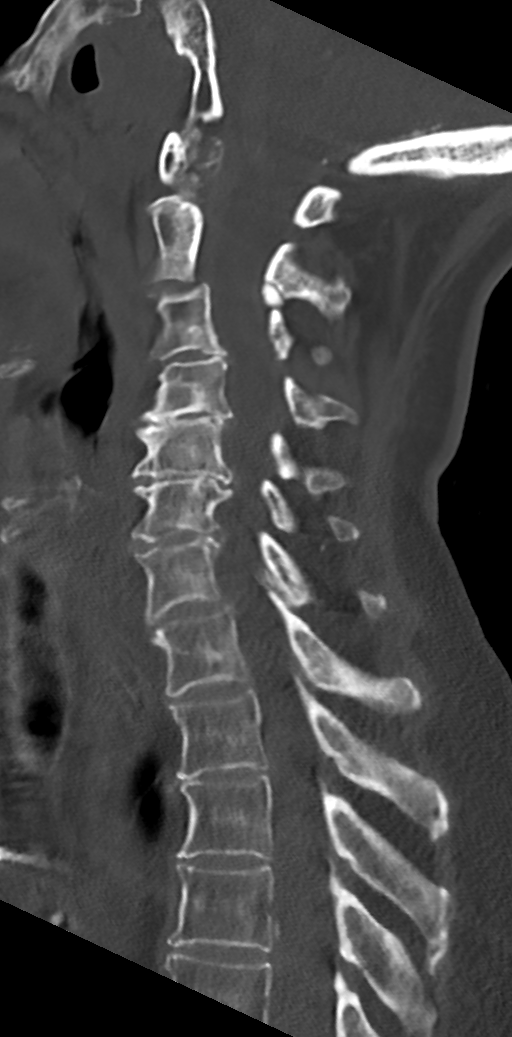
[im 37/56  bone]
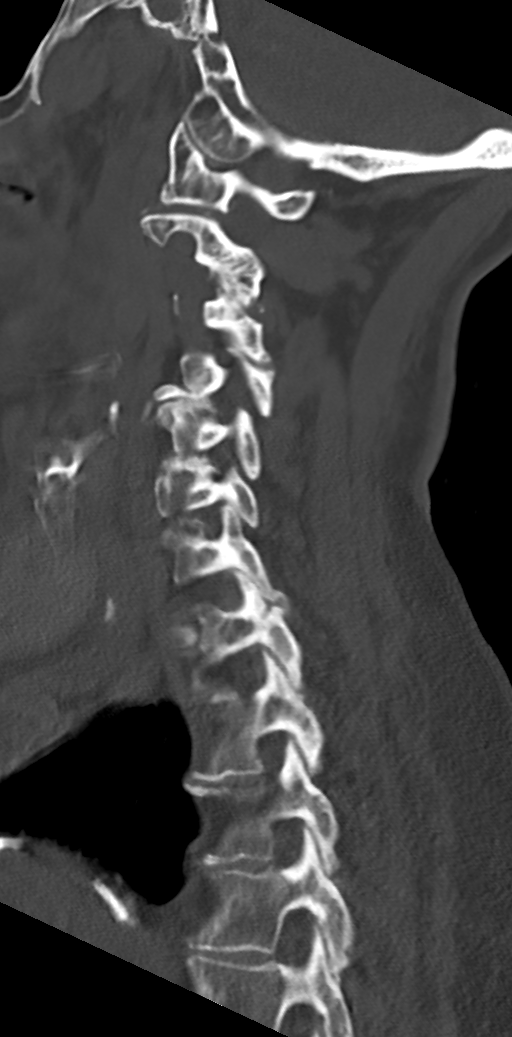

[Series 7: cor bone · coronal · 0.21mm/px · 3 of 58 slices shown]
[im 12/58  bone]
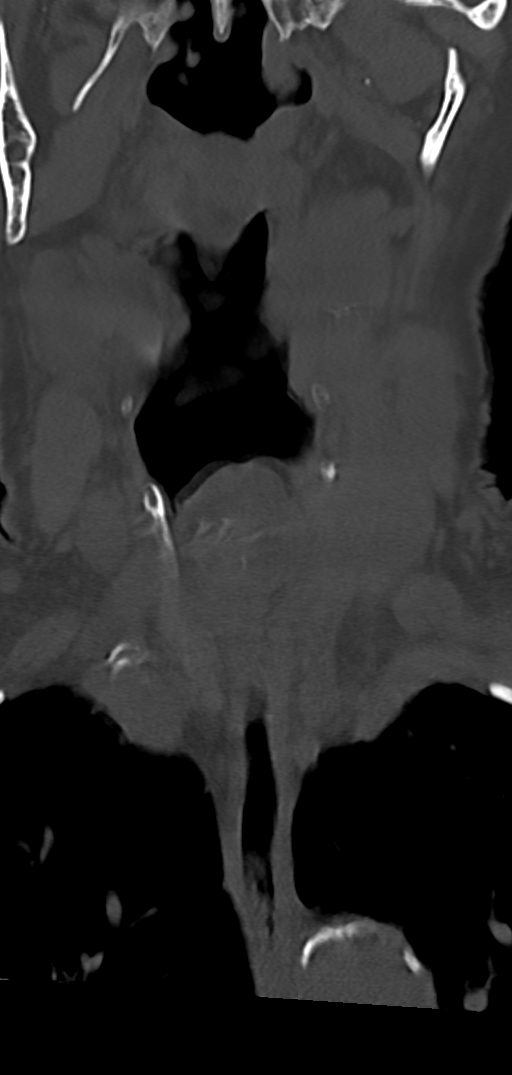
[im 23/58  bone]
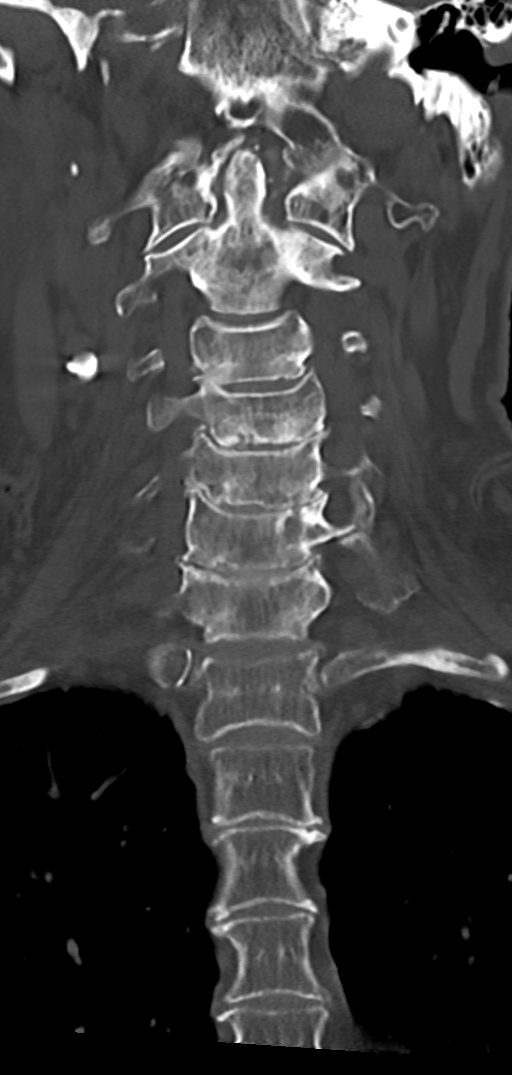
[im 35/58  bone]
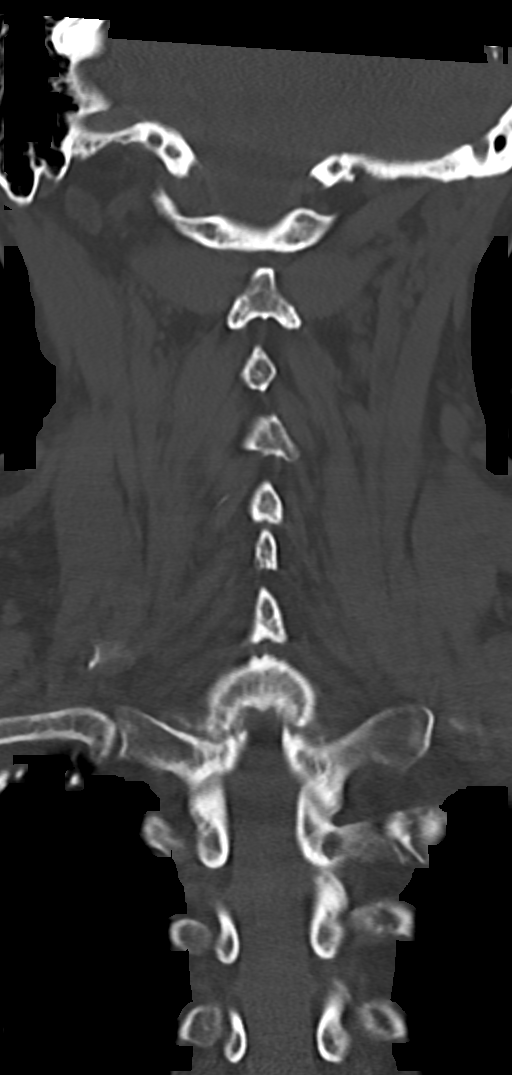

[Series 8: orthogonal axials · axial · 0.21mm/px · z∈[-242,-173]mm · 2 of 116 slices shown, 3 images]
[im 39/116  soft-tissue]
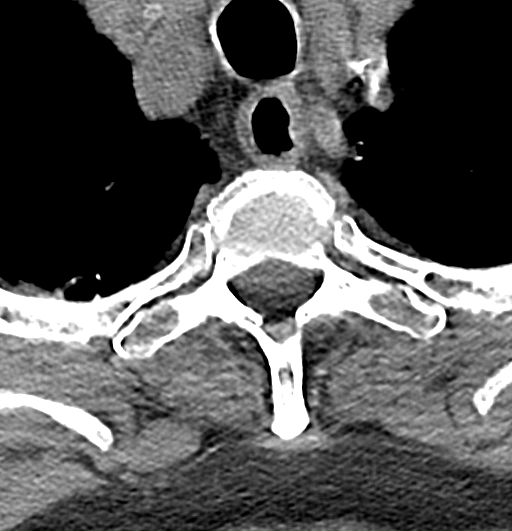
[im 39/116  bone]
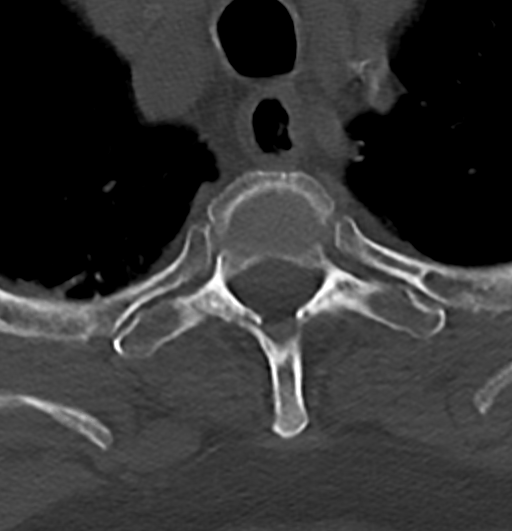
[im 77/116  bone]
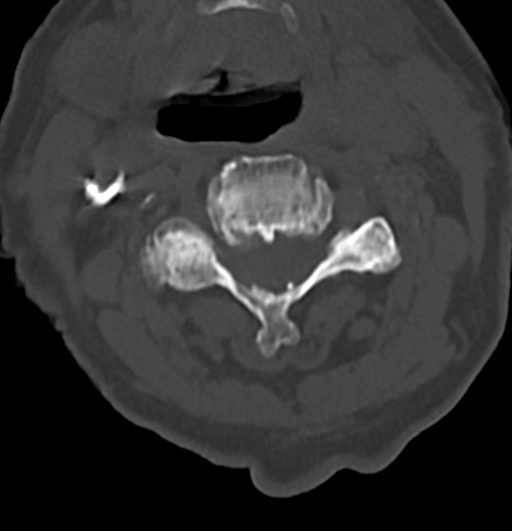

[10 of 33 positions shown; findings below may reference images not displayed]

FINDINGS: CT HEAD FINDINGS

Brain: No acute territorial infarction, hemorrhage or intracranial
mass. Moderate atrophy. Extensive white matter hypodensity
consistent with chronic small vessel ischemic change. Prominent
ventricles, but felt secondary to atrophy

Vascular: No hyperdense vessels.  Carotid vascular calcification.

Skull: Normal. Negative for fracture or focal lesion.

Sinuses/Orbits: No acute finding. Old appearing right nasal bone
fracture.

Other: None

CT CERVICAL SPINE FINDINGS

Alignment: Mild reversal of cervical lordosis. Trace anterolisthesis
C2 on C3. Trace retrolisthesis C4 on C5 and C5 on C6. Facet
alignment within normal limits

Skull base and vertebrae: No acute fracture. No primary bone lesion
or focal pathologic process.

Soft tissues and spinal canal: No prevertebral fluid or swelling. No
visible canal hematoma.

Disc levels: Multilevel degenerative change. Advanced disc space
narrowing C3 through C7. Facet degenerative changes at multiple
levels with foraminal stenosis. Advanced C1-C2 degenerative change.
Chronic appearing calcification superior and anterior to the tip of
the dens.

Upper chest: Emphysema. Scarring and calcification at the right apex

Other: None
IMPRESSION: 1. No CT evidence for acute intracranial abnormality. Atrophy and
advanced chronic small vessel ischemic changes of the white matter
2. Mild reversal of cervical lordosis with multilevel degenerative
changes. No acute fracture
3. Emphysema

## 2021-08-22 IMAGING — CT CT HEAD W/O CM
4 series · 16 of 47 positions shown, 18 images · non-contrast
Comparison: Brain MRI [DATE]

CLINICAL DATA: Fall



[Series 3: head wo · axial · 0.45mm/px · z∈[-96,+24]mm · 7 of 33 slices shown, 9 images]
[im 5/33  brain]
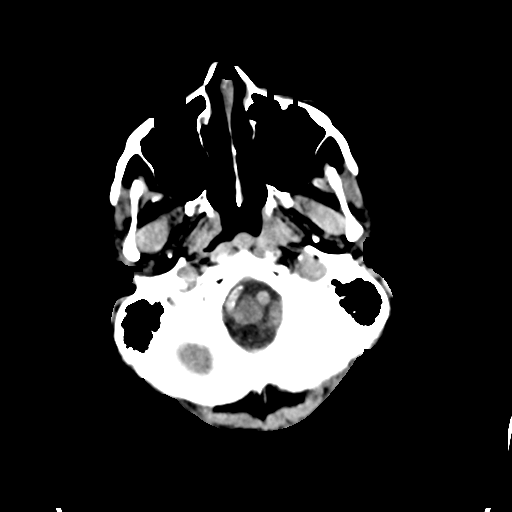
[im 5/33  bone]
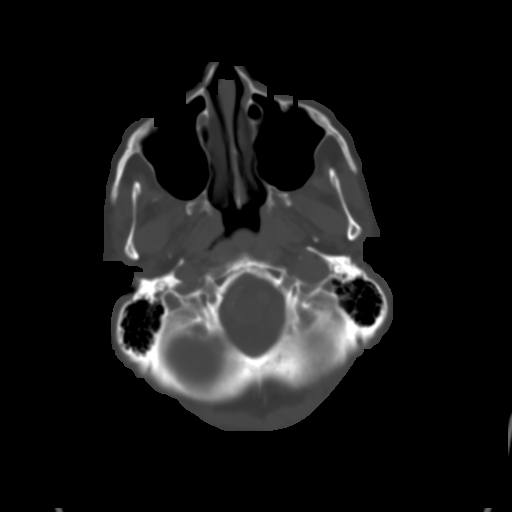
[im 9/33  brain]
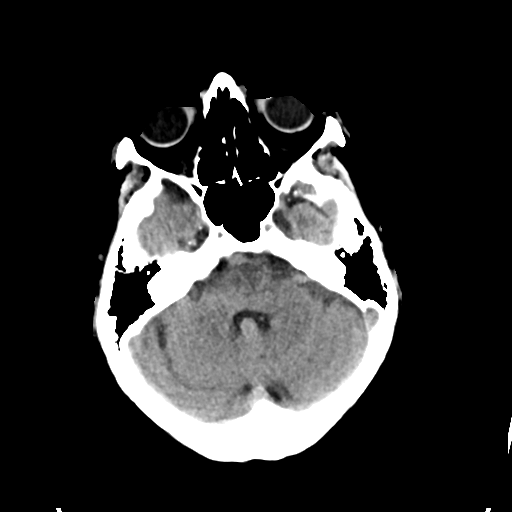
[im 13/33  brain]
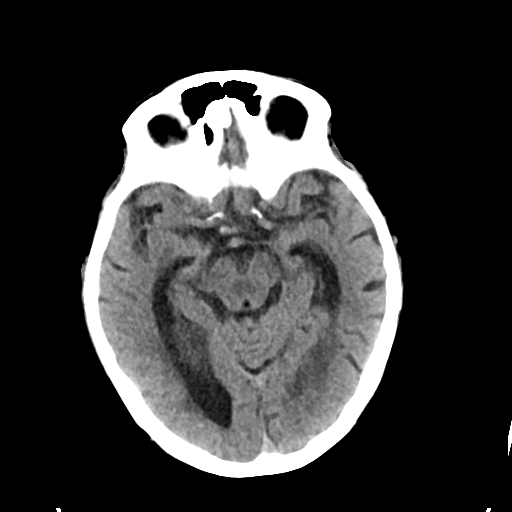
[im 17/33  brain]
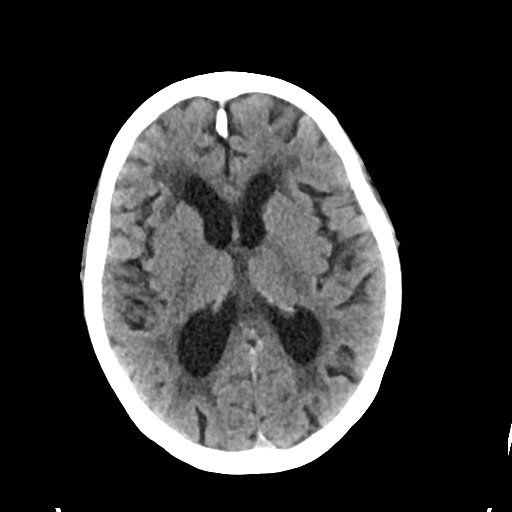
[im 21/33  brain]
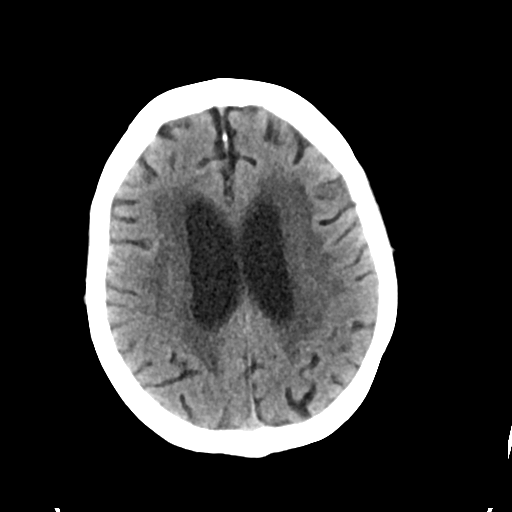
[im 21/33  bone]
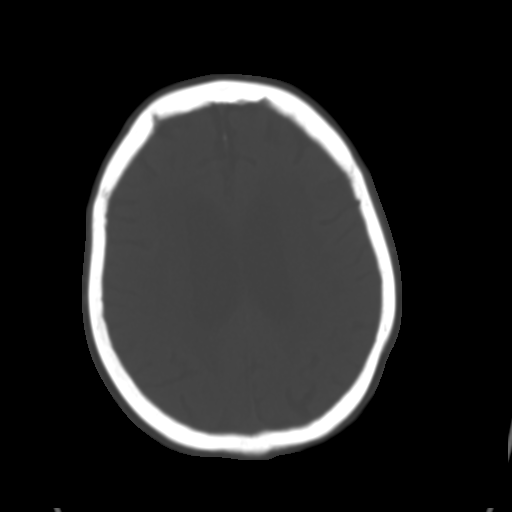
[im 25/33  brain]
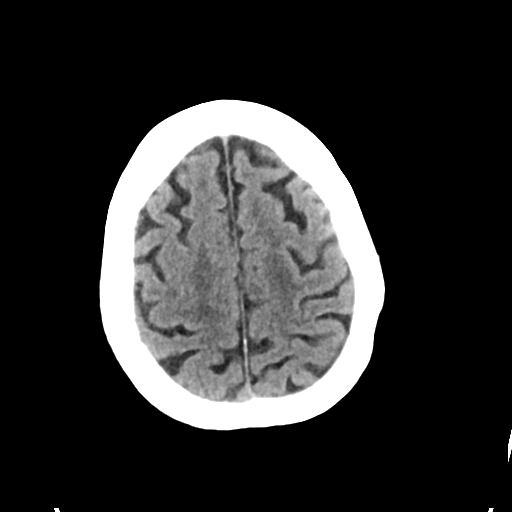
[im 29/33  brain]
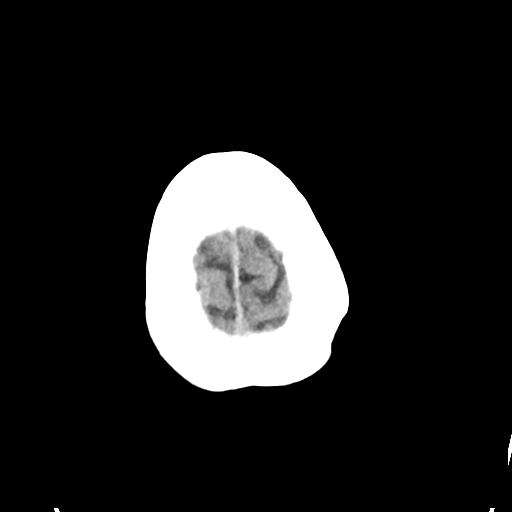

[Series 4: head bone · axial · 0.45mm/px · z∈[-100,-68]mm · 3 of 83 slices shown]
[im 9/83  bone]
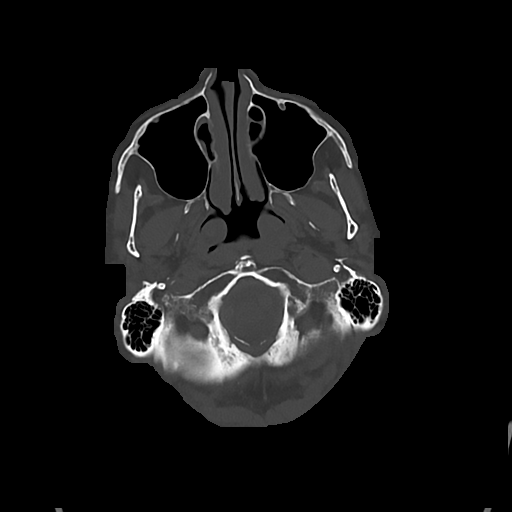
[im 17/83  bone]
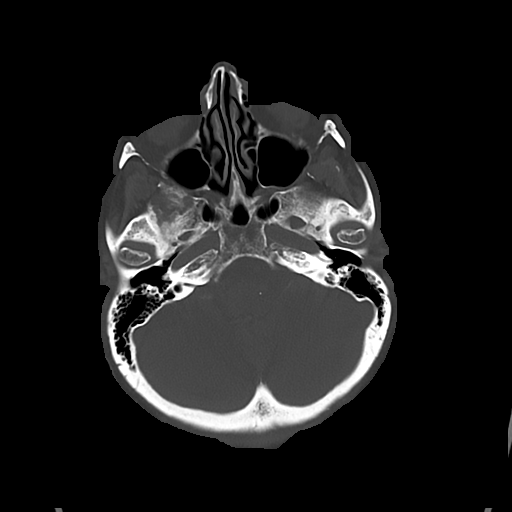
[im 25/83  bone]
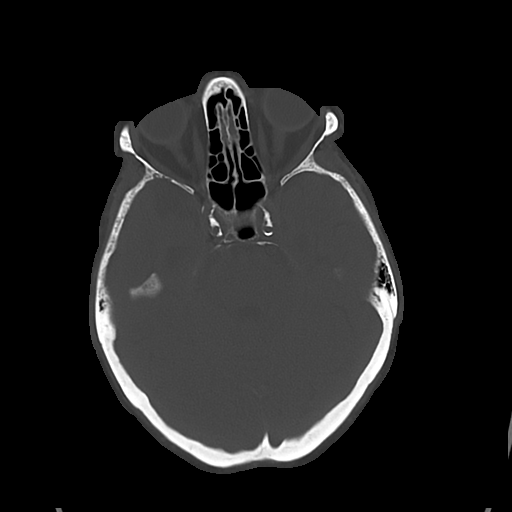

[Series 5: cor soft · coronal · 0.32mm/px · 3 of 63 slices shown]
[im 21/63  brain]
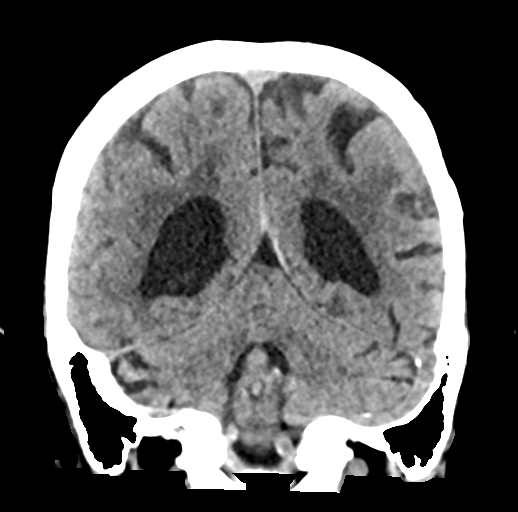
[im 28/63  brain]
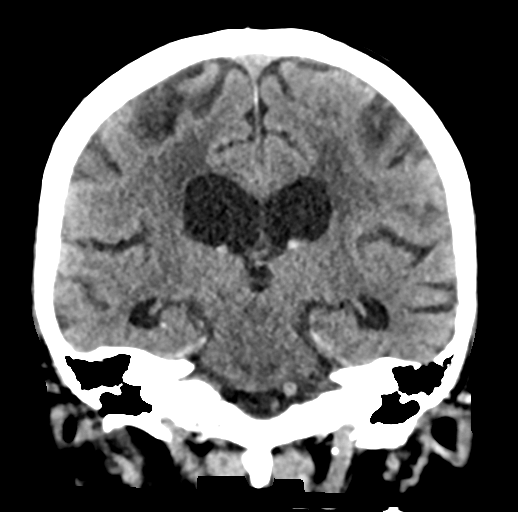
[im 35/63  brain]
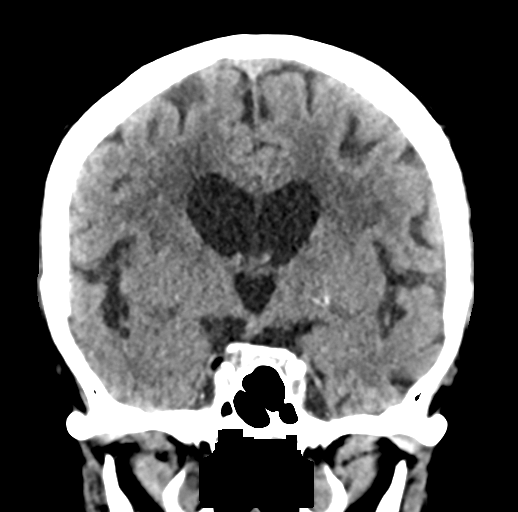

[Series 6: sag soft · sagittal · 0.32mm/px · 3 of 48 slices shown]
[im 16/48  brain]
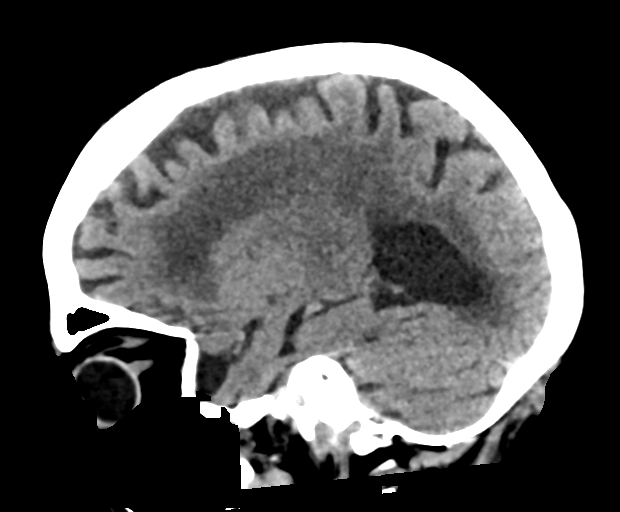
[im 24/48  brain]
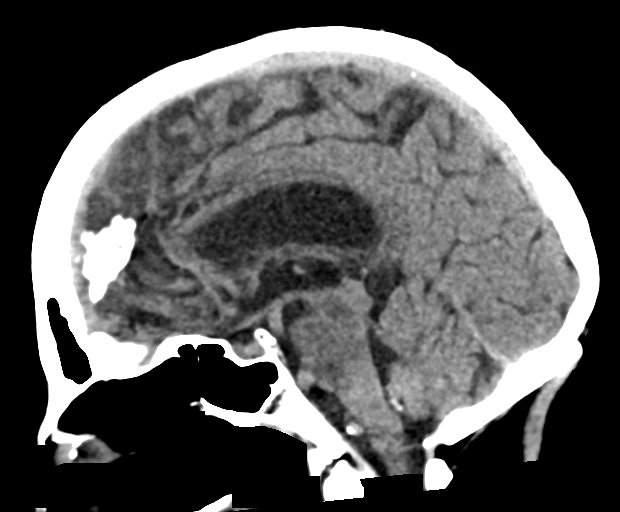
[im 32/48  brain]
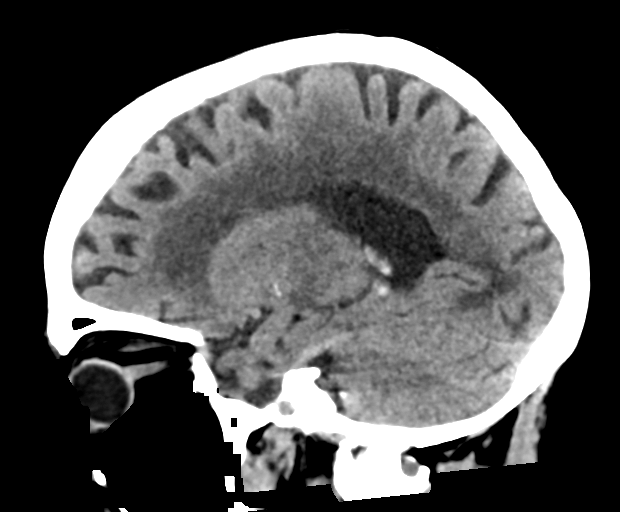

[16 of 47 positions shown; findings below may reference images not displayed]

FINDINGS: CT HEAD FINDINGS

Brain: No acute territorial infarction, hemorrhage or intracranial
mass. Moderate atrophy. Extensive white matter hypodensity
consistent with chronic small vessel ischemic change. Prominent
ventricles, but felt secondary to atrophy

Vascular: No hyperdense vessels.  Carotid vascular calcification.

Skull: Normal. Negative for fracture or focal lesion.

Sinuses/Orbits: No acute finding. Old appearing right nasal bone
fracture.

Other: None

CT CERVICAL SPINE FINDINGS

Alignment: Mild reversal of cervical lordosis. Trace anterolisthesis
C2 on C3. Trace retrolisthesis C4 on C5 and C5 on C6. Facet
alignment within normal limits

Skull base and vertebrae: No acute fracture. No primary bone lesion
or focal pathologic process.

Soft tissues and spinal canal: No prevertebral fluid or swelling. No
visible canal hematoma.

Disc levels: Multilevel degenerative change. Advanced disc space
narrowing C3 through C7. Facet degenerative changes at multiple
levels with foraminal stenosis. Advanced C1-C2 degenerative change.
Chronic appearing calcification superior and anterior to the tip of
the dens.

Upper chest: Emphysema. Scarring and calcification at the right apex

Other: None
IMPRESSION: 1. No CT evidence for acute intracranial abnormality. Atrophy and
advanced chronic small vessel ischemic changes of the white matter
2. Mild reversal of cervical lordosis with multilevel degenerative
changes. No acute fracture
3. Emphysema

## 2021-08-22 MED ORDER — LIDOCAINE 5 % EX PTCH
1.0000 | MEDICATED_PATCH | CUTANEOUS | Status: DC
Start: 2021-08-22 — End: 2021-08-25
  Administered 2021-08-22 – 2021-08-24 (×3): 1 via TRANSDERMAL
  Filled 2021-08-22 (×3): qty 1

## 2021-08-22 MED ORDER — ACETAMINOPHEN 500 MG PO TABS
1000.0000 mg | ORAL_TABLET | Freq: Once | ORAL | Status: AC
  Administered 2021-08-22: 1000 mg via ORAL
  Filled 2021-08-22: qty 2

## 2021-08-22 MED ORDER — IOHEXOL 350 MG/ML SOLN
75.0000 mL | Freq: Once | INTRAVENOUS | Status: AC | PRN
Start: 2021-08-22 — End: 2021-08-22
  Administered 2021-08-22: 75 mL via INTRAVENOUS

## 2021-08-22 NOTE — ED Notes (Signed)
Pt in CT.

## 2021-08-22 NOTE — ED Triage Notes (Signed)
Patient to ER via [facility] after ground level-mechanical fall this afternoon. Complaints of left hip pain. Denies hitting head/ LOC. No blood thinner usage.  Patient hard of hearing.

## 2021-08-22 NOTE — ED Provider Notes (Addendum)
[facility] Provider Note    Event Date/Time   First MD Initiated Contact with Patient 08/22/21 1953     (approximate)   History   Hip Pain   HPI  Natalie [name] is a 88 y.o. female with carotid artery stenosis status post CEA, hypertension, CKD who comes in with concerns for hip pain.  She reports having a mechanical fall where she was try to get her dog leash and she tripped and fell onto her left hip.  She denies any chest pain, shortness of breath, syncopal symptoms.  She reports having difficulty stand up after the event.  Does not think she hit her head.  She is on aspirin baby.      Physical Exam   Triage Vital Signs: ED Triage Vitals  Enc Vitals Group     BP 08/22/21 1538 (!) 168/72     Pulse Rate 08/22/21 1538 96     Resp 08/22/21 1538 20     Temp 08/22/21 1538 (!) 97.5 F (36.4 C)     Temp Source 08/22/21 1538 Oral     SpO2 08/22/21 1538 95 %     Weight 08/22/21 1536 125 lb (56.7 kg)     Height 08/22/21 1536 '5\' 5"'$  (1.651 m)     Head Circumference --      Peak Flow --      Pain Score --      Pain Loc --      Pain Edu? --      Excl. in [place]? --     Most recent vital signs: Vitals:   08/22/21 1538  BP: (!) 168/72  Pulse: 96  Resp: 20  Temp: (!) 97.5 F (36.4 C)  SpO2: 95%     General: Awake, no distress.  CV:  Good peripheral perfusion.  No chest wall tenderness Resp:  Normal effort.  Abd:  No distention.  Soft and nontender Other:  Patient has tenderness in the left hip.  No tenderness in the knee ankle or foot.  Good distal pulses able to dorsiflex and plantarflex the ankle but unable to lift the left leg up off the bed.  She has a very small bruise noted to the left hip but no large hematoma right leg without any tenderness able to lift up off the bed.   ED Results / Procedures / Treatments   Labs (all labs ordered are listed, but only abnormal results are displayed) Labs Reviewed  CBC WITH DIFFERENTIAL/PLATELET   COMPREHENSIVE METABOLIC PANEL  URINALYSIS, ROUTINE W REFLEX MICROSCOPIC  PROTIME-INR  APTT  TYPE AND SCREEN     RADIOLOGY I have reviewed the xray personally and interpreted and possible pelvic fracture  PROCEDURES:  Critical Care performed: No  Procedures   MEDICATIONS ORDERED IN ED: Medications  acetaminophen (TYLENOL) tablet 1,000 mg (has no administration in time range)  lidocaine (LIDODERM) 5 % 1 patch (has no administration in time range)     IMPRESSION / MDM / ASSESSMENT AND PLAN / ED COURSE  I reviewed the triage vital signs and the nursing notes.   Patient's presentation is most consistent with acute, uncomplicated illness.   Differential includes hip fracture, pelvic fracture, given patient's age and history of dementia we will also get CT head and CT cervical to rule out any evidence of intercranial hemorrhage, cervical fracture.  X-ray was difficult to tell if possible pelvic fracture so we will proceed with CT pelvis.  Discussed case with Dr. Mack Guise and  these are weightbearing as tolerated but given the hematoma by the bladder consider admission for pain control as well as trending hemoglobins.  CBC does show a drop in her hemoglobin down to 10.6. Coags normal CMP reassuring  10:32 PM discussed the case with the radiologist about the hematoma whether or not a CT with contrast would be necessary to rule out any active bleeding.  Given the location of the bleed it is lower chance but given the drop in hemoglobin he does recommend a CT angio to evaluate for arterial bleed that would need intervention.  There is just a venous bleed this will not need intervention     The patient is on the cardiac monitor to evaluate for evidence of arrhythmia and/or significant heart rate changes.  Will be handed off to oncoming team pending CTA and admission     FINAL CLINICAL IMPRESSION(S) / ED DIAGNOSES   Final diagnoses:  Left hip pain  Fall, initial encounter      Rx / DC Orders   ED Discharge Orders     None        Note:  This document was prepared using Dragon voice recognition software and may include unintentional dictation errors.   Vanessa [place], MD 08/22/21 2234    Vanessa [place], MD 08/22/21 2234

## 2021-08-22 NOTE — ED Triage Notes (Signed)
Arrives via [facility] from home.  Tipped and fell today. C/O left hip pain.  VS wnl. AOx3.

## 2021-08-22 NOTE — Telephone Encounter (Signed)
Pt daughter called stating pt had a fall today and is going to the hospital

## 2021-08-23 ENCOUNTER — Other Ambulatory Visit: Payer: Self-pay

## 2021-08-23 DIAGNOSIS — J439 Emphysema, unspecified: Secondary | ICD-10-CM | POA: Diagnosis present

## 2021-08-23 DIAGNOSIS — D62 Acute posthemorrhagic anemia: Secondary | ICD-10-CM | POA: Diagnosis present

## 2021-08-23 DIAGNOSIS — I10 Essential (primary) hypertension: Secondary | ICD-10-CM | POA: Diagnosis not present

## 2021-08-23 DIAGNOSIS — Z853 Personal history of malignant neoplasm of breast: Secondary | ICD-10-CM | POA: Diagnosis not present

## 2021-08-23 DIAGNOSIS — M1990 Unspecified osteoarthritis, unspecified site: Secondary | ICD-10-CM | POA: Diagnosis present

## 2021-08-23 DIAGNOSIS — Z9012 Acquired absence of left breast and nipple: Secondary | ICD-10-CM | POA: Diagnosis not present

## 2021-08-23 DIAGNOSIS — S300XXA Contusion of lower back and pelvis, initial encounter: Secondary | ICD-10-CM | POA: Diagnosis present

## 2021-08-23 DIAGNOSIS — Z803 Family history of malignant neoplasm of breast: Secondary | ICD-10-CM | POA: Diagnosis not present

## 2021-08-23 DIAGNOSIS — T148XXA Other injury of unspecified body region, initial encounter: Secondary | ICD-10-CM | POA: Diagnosis present

## 2021-08-23 DIAGNOSIS — Z87891 Personal history of nicotine dependence: Secondary | ICD-10-CM | POA: Diagnosis not present

## 2021-08-23 DIAGNOSIS — W010XXA Fall on same level from slipping, tripping and stumbling without subsequent striking against object, initial encounter: Secondary | ICD-10-CM | POA: Diagnosis present

## 2021-08-23 DIAGNOSIS — I110 Hypertensive heart disease with heart failure: Secondary | ICD-10-CM | POA: Diagnosis present

## 2021-08-23 DIAGNOSIS — Z887 Allergy status to serum and vaccine status: Secondary | ICD-10-CM | POA: Diagnosis not present

## 2021-08-23 DIAGNOSIS — Z7982 Long term (current) use of aspirin: Secondary | ICD-10-CM | POA: Diagnosis not present

## 2021-08-23 DIAGNOSIS — S32592A Other specified fracture of left pubis, initial encounter for closed fracture: Secondary | ICD-10-CM | POA: Diagnosis present

## 2021-08-23 DIAGNOSIS — R0902 Hypoxemia: Secondary | ICD-10-CM | POA: Diagnosis not present

## 2021-08-23 DIAGNOSIS — W19XXXA Unspecified fall, initial encounter: Secondary | ICD-10-CM | POA: Diagnosis present

## 2021-08-23 DIAGNOSIS — E785 Hyperlipidemia, unspecified: Secondary | ICD-10-CM | POA: Diagnosis present

## 2021-08-23 DIAGNOSIS — M25552 Pain in left hip: Secondary | ICD-10-CM | POA: Diagnosis present

## 2021-08-23 DIAGNOSIS — F0390 Unspecified dementia without behavioral disturbance: Secondary | ICD-10-CM | POA: Diagnosis present

## 2021-08-23 DIAGNOSIS — I700 Atherosclerosis of aorta: Secondary | ICD-10-CM | POA: Diagnosis present

## 2021-08-23 DIAGNOSIS — Z79891 Long term (current) use of opiate analgesic: Secondary | ICD-10-CM | POA: Diagnosis not present

## 2021-08-23 DIAGNOSIS — Z8673 Personal history of transient ischemic attack (TIA), and cerebral infarction without residual deficits: Secondary | ICD-10-CM | POA: Diagnosis not present

## 2021-08-23 DIAGNOSIS — Z96641 Presence of right artificial hip joint: Secondary | ICD-10-CM | POA: Diagnosis present

## 2021-08-23 DIAGNOSIS — Z79899 Other long term (current) drug therapy: Secondary | ICD-10-CM | POA: Diagnosis not present

## 2021-08-23 DIAGNOSIS — K219 Gastro-esophageal reflux disease without esophagitis: Secondary | ICD-10-CM | POA: Diagnosis present

## 2021-08-23 DIAGNOSIS — I5032 Chronic diastolic (congestive) heart failure: Secondary | ICD-10-CM | POA: Diagnosis present

## 2021-08-23 LAB — URINALYSIS, ROUTINE W REFLEX MICROSCOPIC
Bacteria, UA: NONE SEEN
Bilirubin Urine: NEGATIVE
Glucose, UA: NEGATIVE mg/dL
Ketones, ur: NEGATIVE mg/dL
Nitrite: NEGATIVE
Protein, ur: NEGATIVE mg/dL
Specific Gravity, Urine: 1.042 — ABNORMAL HIGH (ref 1.005–1.030)
Squamous Epithelial / HPF: NONE SEEN (ref 0–5)
pH: 5 (ref 5.0–8.0)

## 2021-08-23 LAB — HEMOGLOBIN AND HEMATOCRIT, BLOOD
HCT: 30.3 % — ABNORMAL LOW (ref 36.0–46.0)
Hemoglobin: 10.3 g/dL — ABNORMAL LOW (ref 12.0–15.0)

## 2021-08-23 IMAGING — CR DG HIP (WITH OR WITHOUT PELVIS) 2-3V*L*
1 series · 3 of 3 positions shown · non-contrast
Comparison: None Available.

CLINICAL DATA: Tripped and fell

EXAM:
DG HIP (WITH OR WITHOUT PELVIS) 2-3V LEFT

[Series 1: dg hip unilat w or w/o pelvis 2-3 views  · non-contrast · 0.14mm/px · 3 of 3 slices shown]
[im 1/3]
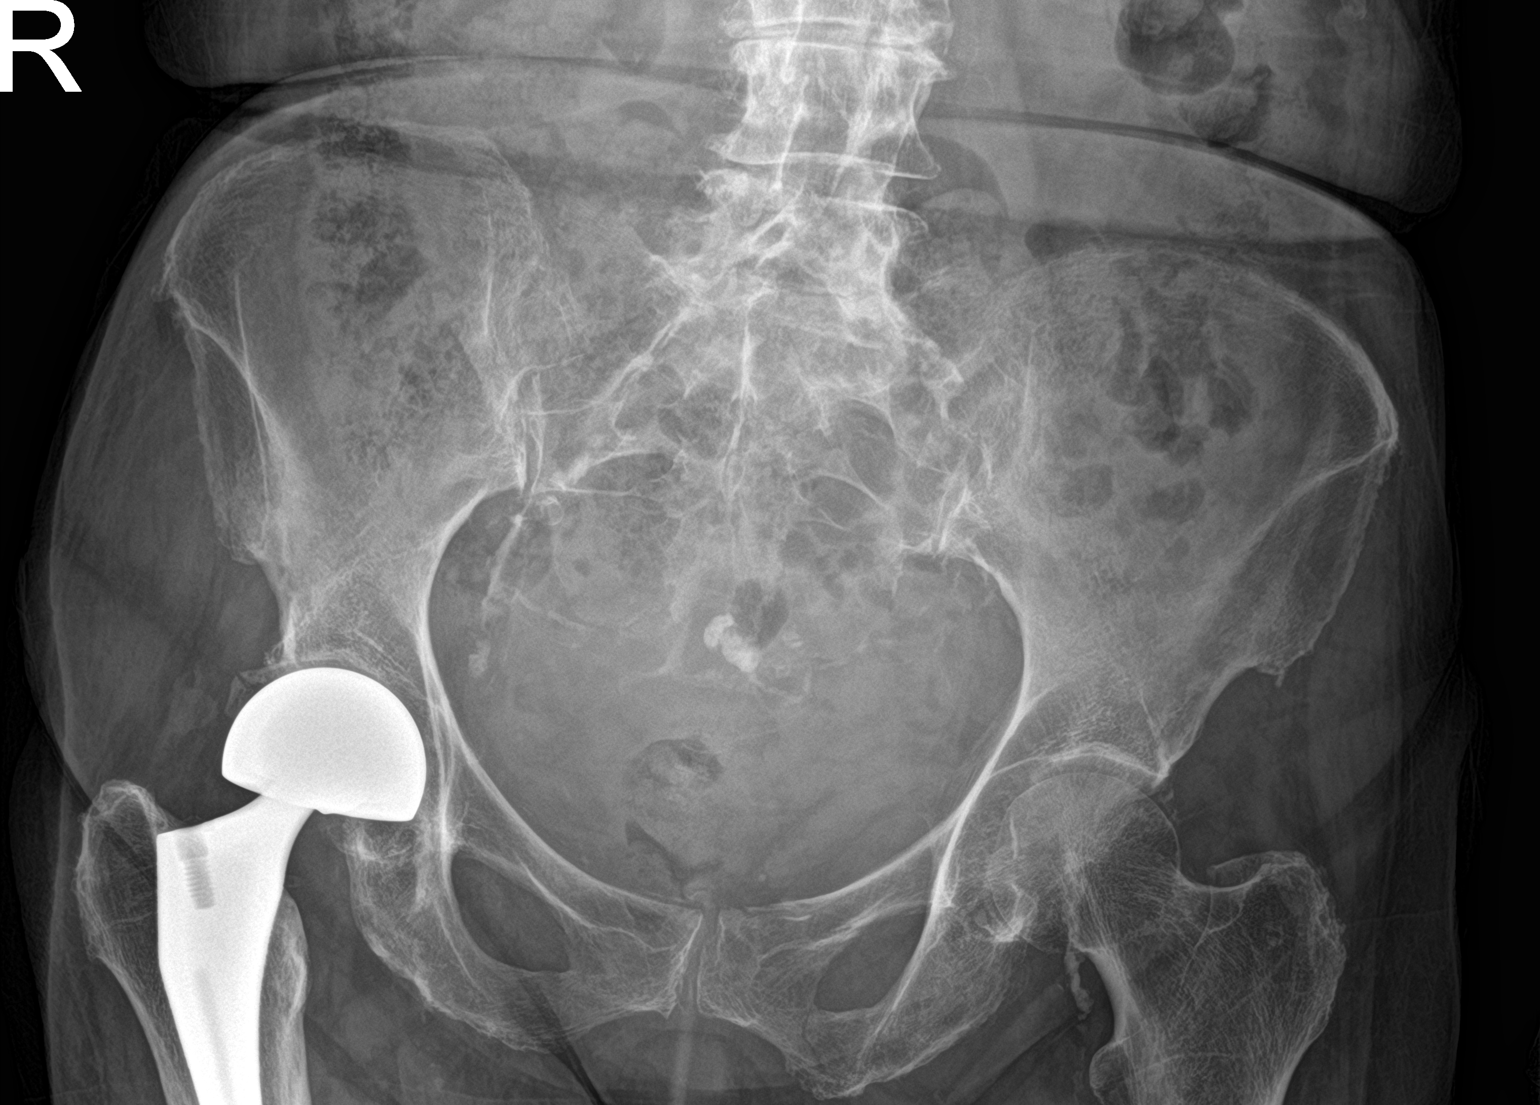
[im 2/3]
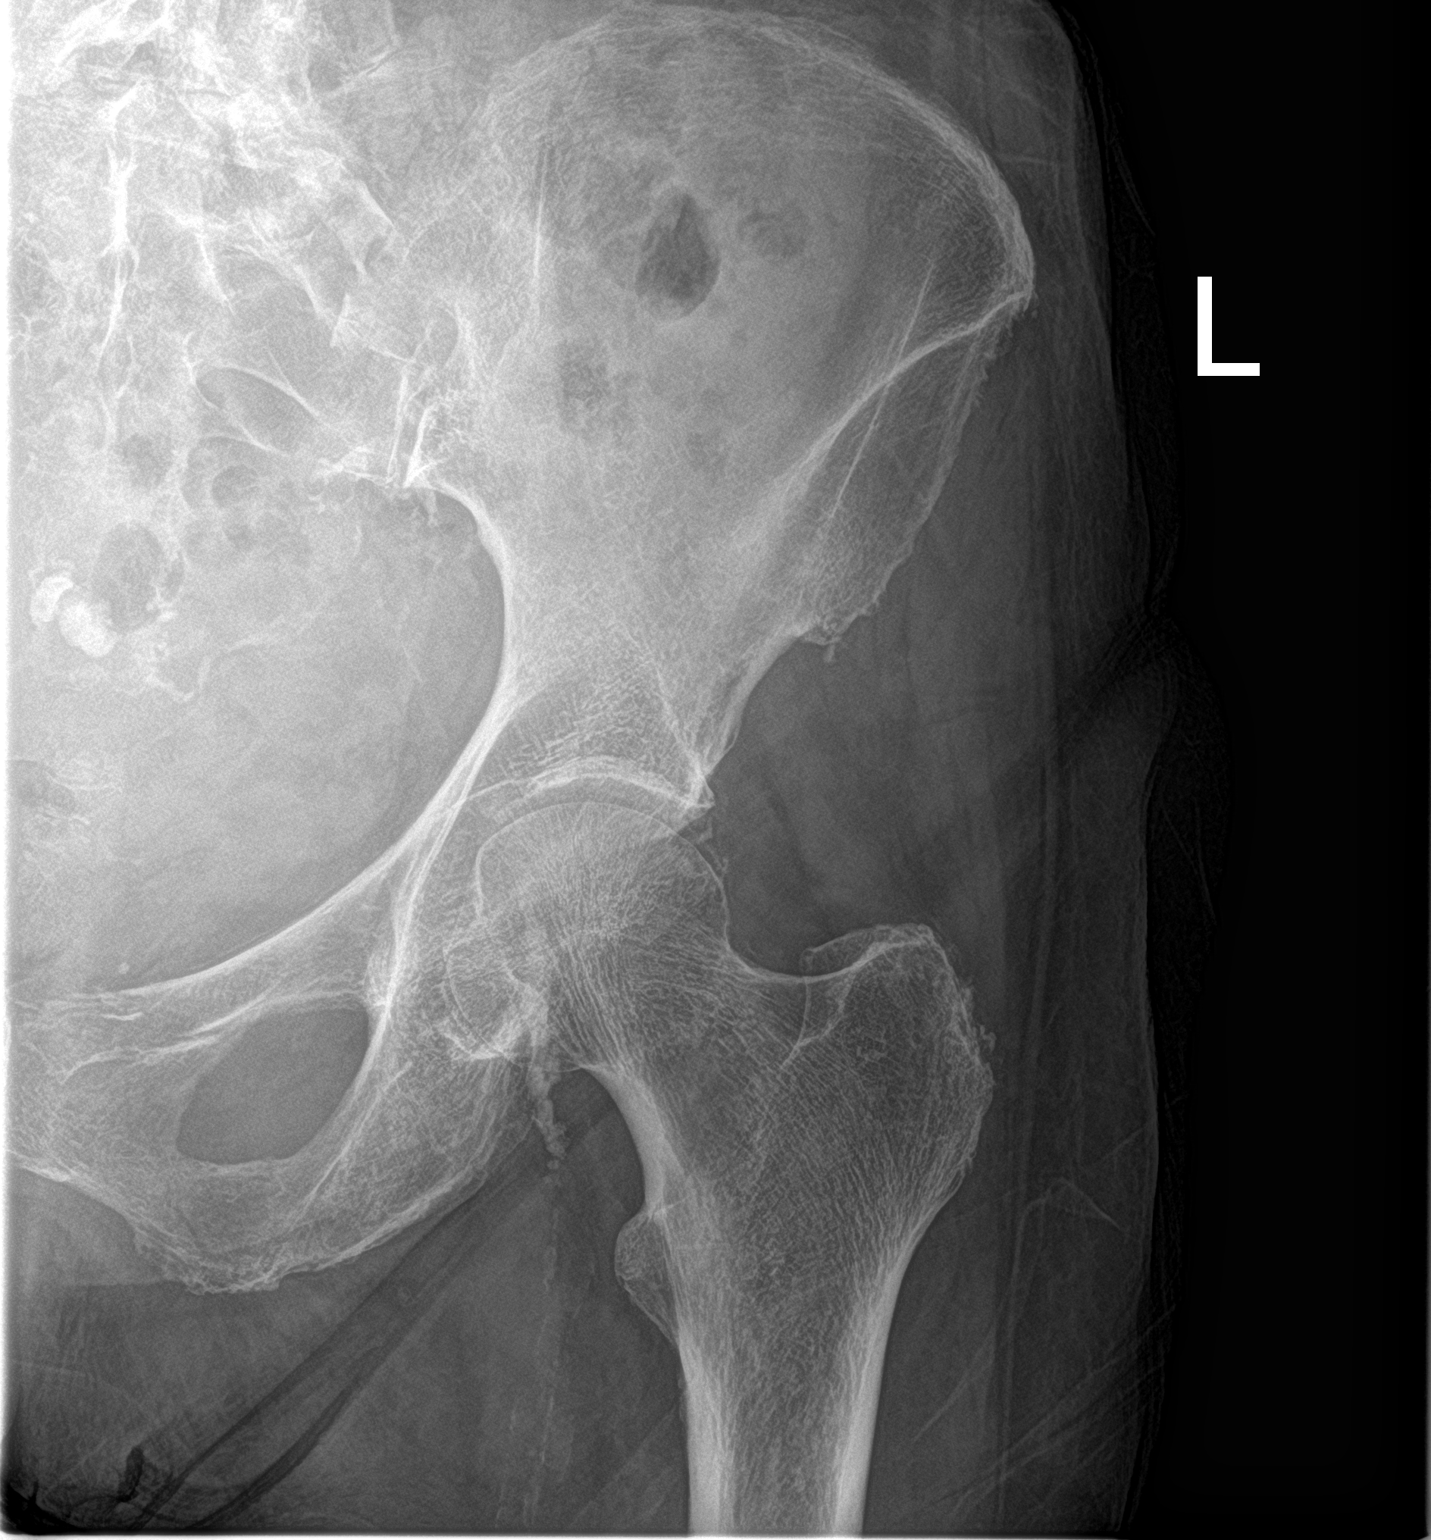
[im 3/3]
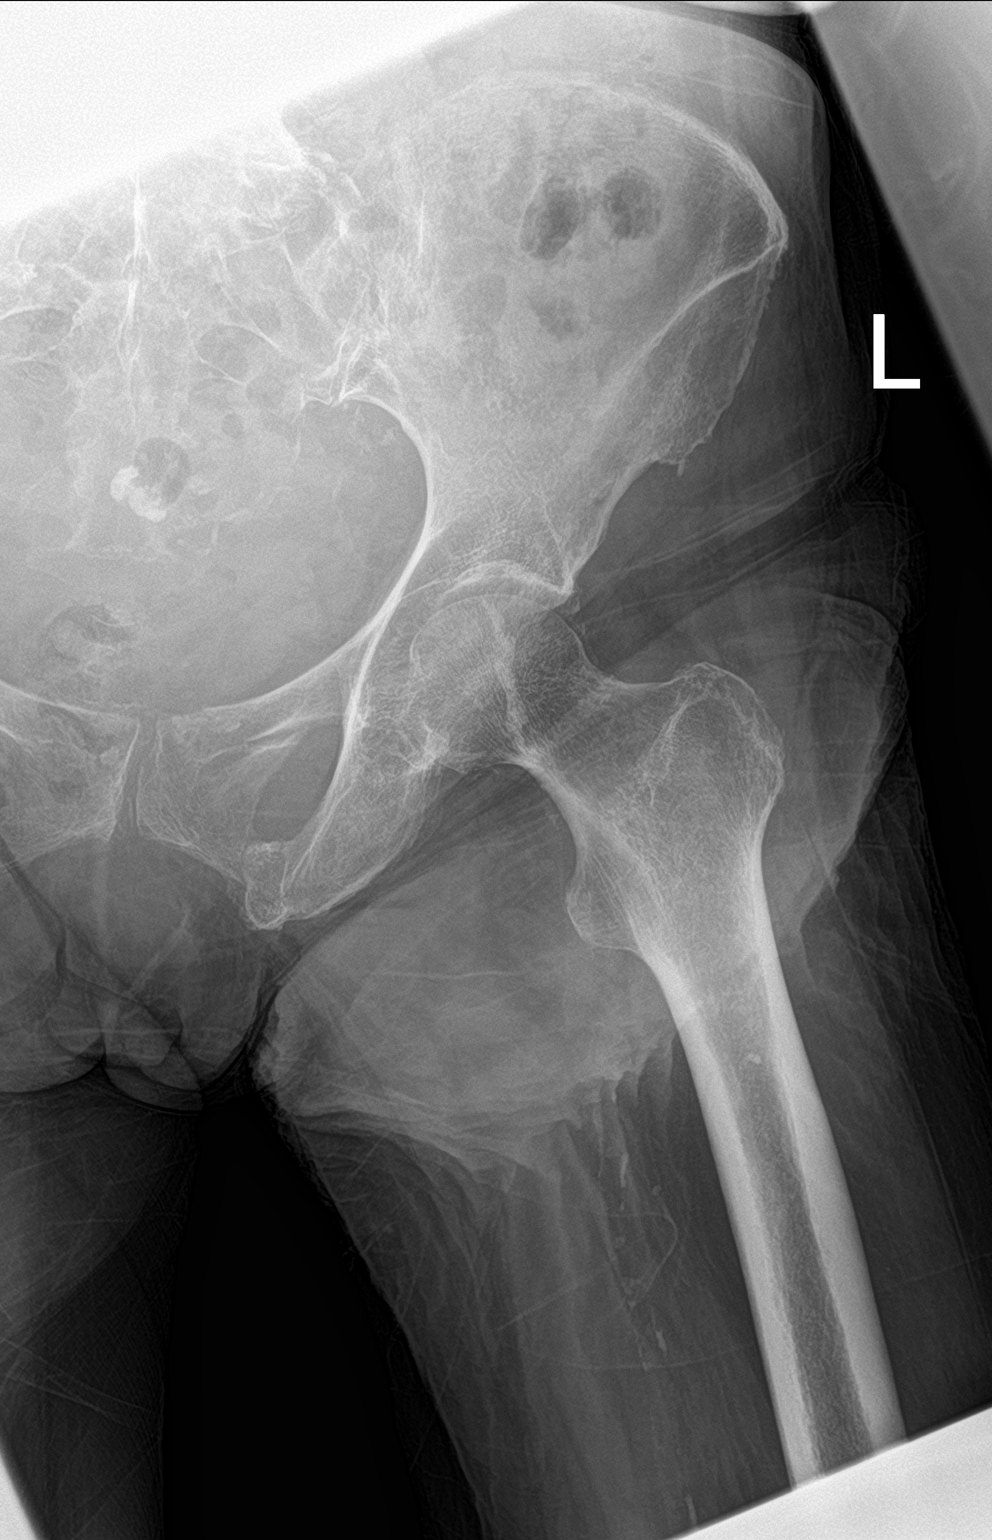

[3 of 3 positions shown; findings below may reference images not displayed]

FINDINGS: SI joints are non widened. Pubic symphysis is intact. There is a
right hip replacement with normal alignment. Probable acute
nondisplaced fracture involving the left superior and inferior pubic
rami.
IMPRESSION: Findings suspicious for nondisplaced fractures involving the left
superior and inferior pubic rami

## 2021-08-23 MED ORDER — ONDANSETRON HCL 4 MG PO TABS: 4.0000 mg | ORAL_TABLET | Freq: Four times a day (QID) | ORAL | Status: DC | PRN

## 2021-08-23 MED ORDER — OXYCODONE HCL 5 MG PO TABS
5.0000 mg | ORAL_TABLET | Freq: Two times a day (BID) | ORAL | Status: DC | PRN
  Administered 2021-08-23: 5 mg via ORAL
  Filled 2021-08-23 (×2): qty 1

## 2021-08-23 MED ORDER — ONDANSETRON HCL 4 MG/2ML IJ SOLN: 4.0000 mg | Freq: Four times a day (QID) | INTRAMUSCULAR | Status: DC | PRN

## 2021-08-23 MED ORDER — ACETAMINOPHEN 650 MG RE SUPP: 650.0000 mg | Freq: Four times a day (QID) | RECTAL | Status: DC | PRN

## 2021-08-23 MED ORDER — OXYCODONE HCL 5 MG PO TABS: 5.0000 mg | ORAL_TABLET | Freq: Three times a day (TID) | ORAL | Status: DC | PRN

## 2021-08-23 MED ORDER — SODIUM CHLORIDE 0.9 % IV SOLN: INTRAVENOUS | Status: AC

## 2021-08-23 MED ORDER — METOPROLOL TARTRATE 25 MG PO TABS
25.0000 mg | ORAL_TABLET | Freq: Two times a day (BID) | ORAL | Status: DC
  Administered 2021-08-23 – 2021-08-25 (×6): 25 mg via ORAL
  Filled 2021-08-23 (×6): qty 1

## 2021-08-23 MED ORDER — TIOTROPIUM BROMIDE MONOHYDRATE 18 MCG IN CAPS
1.0000 | ORAL_CAPSULE | Freq: Every day | RESPIRATORY_TRACT | Status: DC
  Administered 2021-08-24 – 2021-08-25 (×2): 18 ug via RESPIRATORY_TRACT
  Filled 2021-08-23: qty 5

## 2021-08-23 MED ORDER — MEMANTINE HCL 5 MG PO TABS
5.0000 mg | ORAL_TABLET | Freq: Two times a day (BID) | ORAL | Status: DC
  Administered 2021-08-23 – 2021-08-25 (×5): 5 mg via ORAL
  Filled 2021-08-23 (×5): qty 1

## 2021-08-23 MED ORDER — FENTANYL CITRATE PF 50 MCG/ML IJ SOSY
25.0000 ug | PREFILLED_SYRINGE | Freq: Once | INTRAMUSCULAR | Status: AC
  Administered 2021-08-23: 25 ug via INTRAVENOUS
  Filled 2021-08-23: qty 1

## 2021-08-23 MED ORDER — ACETAMINOPHEN 325 MG PO TABS
650.0000 mg | ORAL_TABLET | Freq: Four times a day (QID) | ORAL | Status: DC | PRN
  Administered 2021-08-25: 650 mg via ORAL
  Filled 2021-08-23 (×2): qty 2

## 2021-08-23 MED ORDER — IPRATROPIUM-ALBUTEROL 0.5-2.5 (3) MG/3ML IN SOLN: 3.0000 mL | Freq: Four times a day (QID) | RESPIRATORY_TRACT | Status: DC | PRN

## 2021-08-23 MED ORDER — AMLODIPINE BESYLATE 10 MG PO TABS
10.0000 mg | ORAL_TABLET | Freq: Every day | ORAL | Status: DC
  Administered 2021-08-23 – 2021-08-25 (×3): 10 mg via ORAL
  Filled 2021-08-23: qty 1
  Filled 2021-08-23: qty 2
  Filled 2021-08-23: qty 1

## 2021-08-23 MED ORDER — LISINOPRIL 5 MG PO TABS
5.0000 mg | ORAL_TABLET | Freq: Every day | ORAL | Status: DC
  Administered 2021-08-23 – 2021-08-25 (×3): 5 mg via ORAL
  Filled 2021-08-23 (×3): qty 1

## 2021-08-23 MED ORDER — ATORVASTATIN CALCIUM 20 MG PO TABS
40.0000 mg | ORAL_TABLET | Freq: Every day | ORAL | Status: DC
  Administered 2021-08-23 – 2021-08-25 (×3): 40 mg via ORAL
  Filled 2021-08-23 (×3): qty 2

## 2021-08-23 NOTE — Telephone Encounter (Signed)
Natalie [name], patient's daughter and checked on the patient and her daughter informed me that Natalie [name] has a cracked pelvis, but does not have to have surgery. Will be in hospital for a few more days though for observation and to begin PT before she goes home. Will check back in a couple days.

## 2021-08-23 NOTE — Assessment & Plan Note (Addendum)
Will hold aspirin until sure bleeding is controlled.  Resumed on discharge. Continue statin

## 2021-08-23 NOTE — Consult Note (Addendum)
ORTHOPAEDIC CONSULTATION  REQUESTING PHYSICIAN: Shawna Clamp, MD  Chief Complaint: Left hip pain status post fall  HPI: Natalie [name] is a 88 y.o. female who complains of pain in the left hip after a fall onto her left side.  Patient was brought to the emergency department by EMS.  Patient was diagnosed with minimally displaced fractures of the left superior and inferior pubic rami by CT scan.  Orthopedics is consulted for management of these fractures.  Patient has been noted to have an associated hematoma related to the pubic rami fractures.  Patient has not demonstrated a significant decrease in hemoglobin or hematocrit.  A follow-up CT scan has not demonstrated enlargement of the hematoma.  Patient is seen this evening in her hospital bed.  Her daughter is in the room.  Past Medical History:  Diagnosis Date   Allergies    hay fever   Arthritis    Breast cancer, left ([place]) 2015   Left total mastectomy   COPD (chronic obstructive pulmonary disease) (HCC)    GERD (gastroesophageal reflux disease)    Hyperlipemia    Hypertension    Stroke ([place]) 2002   TIA   Past Surgical History:  Procedure Laterality Date   BREAST BIOPSY  2014   BREAST LUMPECTOMY  2014   left side   ENDARTERECTOMY Left 02/28/2019   Procedure: ENDARTERECTOMY CAROTID;  Surgeon: Katha Cabal, MD;  Location: [facility] ORS;  Service: Vascular;  Laterality: Left;   EVACUATION OF CERVICAL HEMATOMA Left 02/28/2019   Procedure: EVACUATION OF HEMATOMA;  Surgeon: Katha Cabal, MD;  Location: [facility] ORS;  Service: Vascular;  Laterality: Left;   EYE SURGERY Bilateral    cataract extractions   JOINT REPLACEMENT     MASTECTOMY Left May 9th 2017   [place]   TOTAL HIP ARTHROPLASTY Right    head of the femur replaced due to fracture   Social History   Socioeconomic History   Marital status: Single    Spouse name: Not on file   Number of children: Not on file   Years of  education: Not on file   Highest education level: Not on file  Occupational History   Occupation: CRNA    Comment: retired  Tobacco Use   Smoking status: Former   Smokeless tobacco: Never   Tobacco comments:    20 years ago  Vaping Use   Vaping Use: Never used  Substance and Sexual Activity   Alcohol use: Yes    Comment: occassionally   Drug use: Never   Sexual activity: Not Currently  Other Topics Concern   Not on file  Social History Narrative   Patient lives with her daughter   Right handed'   2 sons   Social Determinants of Health   Financial Resource Strain: Low Risk  (12/21/2020)   Overall Financial Resource Strain (CARDIA)    Difficulty of Paying Living Expenses: Not hard at all  Food Insecurity: No Food Insecurity (12/21/2020)   Hunger Vital Sign    Worried About Running Out of Food in the Last Year: Never true    [place] in the Last Year: Never true  Transportation Needs: No Transportation Needs (12/21/2020)   PRAPARE - Hydrologist (Medical): No    Lack of Transportation (Non-Medical): No  Physical Activity: Insufficiently Active (12/21/2020)   Exercise Vital Sign    Days of Exercise per Week: 6 days  Minutes of Exercise per Session: 20 min  Stress: No Stress Concern Present (12/21/2020)   [place]    Feeling of Stress : Not at all  Social Connections: Unknown (12/21/2020)   Social Connection and Isolation Panel [NHANES]    Frequency of Communication with Friends and Family: Not on file    Frequency of Social Gatherings with Friends and Family: More than three times a week    Attends Religious Services: Not on Advertising copywriter or Organizations: Not on file    Attends Archivist Meetings: Not on file    Marital Status: Not on file   Family History  Problem Relation Age of Onset   Breast cancer Mother    Lung cancer Father     Allergies  Allergen Reactions   Influenza Vaccines Shortness Of Breath   Prior to Admission medications   Medication Sig Start Date End Date Taking? Authorizing Provider  amLODipine (NORVASC) 10 MG tablet TAKE 1 TABLET BY MOUTH EVERY DAY 03/24/21  Yes [name], FNP  aspirin EC 81 MG tablet Take 81 mg by mouth daily. In preparation for surgery 12/18/ After stopping plavix   Yes [provider]  atorvastatin (LIPITOR) 40 MG tablet TAKE 1 TABLET BY MOUTH EVERY DAY 07/08/21  Yes Arnett, Yvetta Coder, FNP  Calcium Citrate-Vitamin D 250-200 MG-UNIT TABS Take 250 mg by mouth every morning.   Yes [provider]  cholecalciferol (VITAMIN D3) 25 MCG (1000 UT) tablet Take 1,000 Units by mouth daily.    Yes [provider]  cyanocobalamin (,VITAMIN B-12,) 1000 MCG/ML injection Inject 1,000 mcg into the muscle every 30 (thirty) days.   Yes [provider]  fluticasone (FLONASE) 50 MCG/ACT nasal spray Place 2 sprays into both nostrils daily as needed for allergies.   Yes [provider]  ipratropium-albuterol (DUONEB) 0.5-2.5 (3) MG/3ML SOLN Take 3 mLs by nebulization 4 (four) times daily as needed. 07/27/21  Yes Arnett, Yvetta Coder, FNP  lisinopril (ZESTRIL) 5 MG tablet TAKE 1 TABLET BY MOUTH EVERY DAY 07/05/21  Yes Arnett, Yvetta Coder, FNP  memantine (NAMENDA) 5 MG tablet Take 1 tablet (5 mg at night) for 2 weeks, then increase to 1 tablet (5 mg) twice a day 08/05/21  Yes Shawn Route, Coralee Pesa, PA-C  metoprolol tartrate (LOPRESSOR) 50 MG tablet TAKE 1/2 TABLET BY MOUTH TWICE A DAY 07/08/21  Yes [name], FNP  Tiotropium Bromide Monohydrate 2.5 MCG/ACT AERS Inhale 2 puffs into the lungs daily. 07/20/21  Yes [name], FNP  traZODone (DESYREL) 50 MG tablet Take 0.5 tablets (25 mg total) by mouth at bedtime. Patient taking differently: Take 25 mg by mouth at bedtime as needed. 06/15/21   [name], FNP   CT Angio Abd/Pel W and/or Wo  Contrast  Result Date: 08/23/2021 CLINICAL DATA:  Pelvic fracture.  Rule out arterial bleed. EXAM: CTA ABDOMEN AND PELVIS WITHOUT AND WITH CONTRAST TECHNIQUE: Multidetector CT imaging of the abdomen and pelvis was performed using the standard protocol during bolus administration of intravenous contrast. Multiplanar reconstructed images and MIPs were obtained and reviewed to evaluate the vascular anatomy. RADIATION DOSE REDUCTION: This exam was performed according to the departmental dose-optimization program which includes automated exposure control, adjustment of the mA and/or kV according to patient size and/or use of iterative reconstruction technique. CONTRAST:  79m OMNIPAQUE IOHEXOL 350 MG/ML SOLN COMPARISON:  Pelvic CT dated 08/22/2021. FINDINGS: VASCULAR  Aorta: Advanced atherosclerotic calcification of the aorta. No aneurysmal dilatation or dissection. No periaortic fluid collection. Celiac: The celiac trunk and its major branches appear patent. SMA: Atherosclerotic calcification of the SMA.  The SMA is patent. Renals: Atherosclerotic calcification of the renal arteries. Focal high-grade narrowing of the right renal artery. The renal arteries are patent. IMA: The IMA is patent. Inflow: Advanced atherosclerotic calcification of the iliac arteries. There is chronic thrombus and scarring of the proximal left common iliac artery with reconstitution of flow. The external iliac arteries are patent bilaterally. Proximal Outflow: Atherosclerotic calcification. The visualized proximal outflow are patent. Veins: The IVC is unremarkable.  No portal venous gas. Review of the MIP images confirms the above findings. NON-VASCULAR Lower chest: Bibasilar linear atelectasis/scarring. Partially visualized traced pericardial effusion anterior to the heart. No intra-abdominal free air or free fluid. Hepatobiliary: The liver is unremarkable. The gallbladder is unremarkable. Pancreas: There is a 1.4 cm hypodense lesion in the  head of the pancreas, not characterized on this CT but may represent a side branch IPMN. This can be better evaluated with pancreatic protocol MRI on a nonemergent/outpatient basis. No acute inflammatory changes. Spleen: Normal in size without focal abnormality. Adrenals/Urinary Tract: Mild bilateral adrenal thickening/hyperplasia. There is a 2.5 x 2.3 cm exophytic lesion from the superior pole of the left kidney which is indeterminate and not characterized on this CT. Further initial characterization with ultrasound on a nonemergent/outpatient basis recommended. MRI may provide additional evaluation depending on ultrasound findings. There is no hydronephrosis on either side. The visualized ureters appear unremarkable. Mild thickened and trabeculated appearance of the bladder wall. Stomach/Bowel: Small hiatal hernia. There is moderate stool throughout the colon. No bowel obstruction or active inflammation. The appendix is not visualized with certainty. No inflammatory changes identified in the right lower quadrant. Lymphatic: No adenopathy. Reproductive: The uterus is anteverted. Small calcified fibroid. No obvious adnexal masses. Other: Left pelvic hematoma similar to prior CT with associated mild mass effect on the bladder. No extravasation of contrast or evidence of active bleed. Musculoskeletal: Osteopenia with degenerative changes of the spine. Total right hip arthroplasty with associated streak artifact limiting evaluation of the pelvic structure. Acute left pubic bone fractures as seen on the earlier CT. Old-appearing compression fracture of superior endplate of L1. IMPRESSION: 1. Left pelvic hematoma similar to prior CT with associated mild mass effect on the bladder. No extravasation of contrast or evidence of active bleed. 2. Advanced atherosclerotic disease of the abdominal aorta and its branches as described. There is chronic thrombus and scarring of the proximal left common iliac artery with  reconstitution of flow. 3. Indeterminate pancreatic and left renal lesions. Further characterization with MRI without and with contrast on a nonemergent/outpatient basis recommended. Electronically Signed   By: Anner Crete M.D.   On: 08/23/2021 00:03   CT HEAD WO CONTRAST (5MM)  Result Date: 08/22/2021 CLINICAL DATA:  Fall EXAM: CT HEAD WITHOUT CONTRAST CT CERVICAL SPINE WITHOUT CONTRAST TECHNIQUE: Multidetector CT imaging of the head and cervical spine was performed following the standard protocol without intravenous contrast. Multiplanar CT image reconstructions of the cervical spine were also generated. RADIATION DOSE REDUCTION: This exam was performed according to the departmental dose-optimization program which includes automated exposure control, adjustment of the mA and/or kV according to patient size and/or use of iterative reconstruction technique. COMPARISON:  Brain MRI 06/15/2021 FINDINGS: CT HEAD FINDINGS Brain: No acute territorial infarction, hemorrhage or intracranial mass. Moderate atrophy. Extensive white matter hypodensity consistent with chronic small vessel  ischemic change. Prominent ventricles, but felt secondary to atrophy Vascular: No hyperdense vessels.  Carotid vascular calcification. Skull: Normal. Negative for fracture or focal lesion. Sinuses/Orbits: No acute finding. Old appearing right nasal bone fracture. Other: None CT CERVICAL SPINE FINDINGS Alignment: Mild reversal of cervical lordosis. Trace anterolisthesis C2 on C3. Trace retrolisthesis C4 on C5 and C5 on C6. Facet alignment within normal limits Skull base and vertebrae: No acute fracture. No primary bone lesion or focal pathologic process. Soft tissues and spinal canal: No prevertebral fluid or swelling. No visible canal hematoma. Disc levels: Multilevel degenerative change. Advanced disc space narrowing C3 through C7. Facet degenerative changes at multiple levels with foraminal stenosis. Advanced C1-C2 degenerative  change. Chronic appearing calcification superior and anterior to the tip of the dens. Upper chest: Emphysema. Scarring and calcification at the right apex Other: None IMPRESSION: 1. No CT evidence for acute intracranial abnormality. Atrophy and advanced chronic small vessel ischemic changes of the white matter 2. Mild reversal of cervical lordosis with multilevel degenerative changes. No acute fracture 3. Emphysema Electronically Signed   By: Donavan Foil M.D.   On: 08/22/2021 21:04   CT Cervical Spine Wo Contrast  Result Date: 08/22/2021 CLINICAL DATA:  Fall EXAM: CT HEAD WITHOUT CONTRAST CT CERVICAL SPINE WITHOUT CONTRAST TECHNIQUE: Multidetector CT imaging of the head and cervical spine was performed following the standard protocol without intravenous contrast. Multiplanar CT image reconstructions of the cervical spine were also generated. RADIATION DOSE REDUCTION: This exam was performed according to the departmental dose-optimization program which includes automated exposure control, adjustment of the mA and/or kV according to patient size and/or use of iterative reconstruction technique. COMPARISON:  Brain MRI 06/15/2021 FINDINGS: CT HEAD FINDINGS Brain: No acute territorial infarction, hemorrhage or intracranial mass. Moderate atrophy. Extensive white matter hypodensity consistent with chronic small vessel ischemic change. Prominent ventricles, but felt secondary to atrophy Vascular: No hyperdense vessels.  Carotid vascular calcification. Skull: Normal. Negative for fracture or focal lesion. Sinuses/Orbits: No acute finding. Old appearing right nasal bone fracture. Other: None CT CERVICAL SPINE FINDINGS Alignment: Mild reversal of cervical lordosis. Trace anterolisthesis C2 on C3. Trace retrolisthesis C4 on C5 and C5 on C6. Facet alignment within normal limits Skull base and vertebrae: No acute fracture. No primary bone lesion or focal pathologic process. Soft tissues and spinal canal: No prevertebral  fluid or swelling. No visible canal hematoma. Disc levels: Multilevel degenerative change. Advanced disc space narrowing C3 through C7. Facet degenerative changes at multiple levels with foraminal stenosis. Advanced C1-C2 degenerative change. Chronic appearing calcification superior and anterior to the tip of the dens. Upper chest: Emphysema. Scarring and calcification at the right apex Other: None IMPRESSION: 1. No CT evidence for acute intracranial abnormality. Atrophy and advanced chronic small vessel ischemic changes of the white matter 2. Mild reversal of cervical lordosis with multilevel degenerative changes. No acute fracture 3. Emphysema Electronically Signed   By: Donavan Foil M.D.   On: 08/22/2021 21:04   CT PELVIS WO CONTRAST  Result Date: 08/22/2021 CLINICAL DATA:  Left hip pain EXAM: CT PELVIS WITHOUT CONTRAST TECHNIQUE: Multidetector CT imaging of the pelvis was performed following the standard protocol without intravenous contrast. RADIATION DOSE REDUCTION: This exam was performed according to the departmental dose-optimization program which includes automated exposure control, adjustment of the mA and/or kV according to patient size and/or use of iterative reconstruction technique. COMPARISON:  None Available. FINDINGS: Bones/Joint/Cartilage Generalized osteopenia. No left hip fracture or dislocation. Mildly comminuted fracture of the left superior  and inferior pubic rami without significant displacement. Large perivesicular hematoma adjacent to the left anterolateral bladder extending cephalad. Right total hip arthroplasty without hardware failure or complication. Severe degenerative disease with disc height loss at L5-S1. Degenerative disease with disc height loss at L4-5. Normal alignment. No joint effusion. Ligaments Ligaments are suboptimally evaluated by CT. Muscles and Tendons Muscles are normal. No muscle atrophy. No intramuscular fluid collection or hematoma. Soft tissue No other fluid  collection or hematoma. No soft tissue mass. Abdominal aortic atherosclerosis. IMPRESSION: 1. Mildly comminuted fracture of the left superior and inferior pubic rami without significant displacement. Mildly comminuted fracture of the left superior and inferior pubic rami without significant displacement. Large perivesicular hematoma adjacent to the left anterolateral bladder extending cephalad. Electronically Signed   By: Kathreen Devoid M.D.   On: 08/22/2021 20:59   DG Hip Unilat With Pelvis 2-3 Views Left  Result Date: 08/22/2021 CLINICAL DATA:  Tripped and fell EXAM: DG HIP (WITH OR WITHOUT PELVIS) 2-3V LEFT COMPARISON:  None Available. FINDINGS: SI joints are non widened. Pubic symphysis is intact. There is a right hip replacement with normal alignment. Probable acute nondisplaced fracture involving the left superior and inferior pubic rami. IMPRESSION: Findings suspicious for nondisplaced fractures involving the left superior and inferior pubic rami Electronically Signed   By: Donavan Foil M.D.   On: 08/22/2021 16:12    Positive ROS: All other systems have been reviewed and were otherwise negative with the exception of those mentioned in the HPI and as above.  Physical Exam: General: Alert, no acute distress  MUSCULOSKELETAL: Patient did not have significant tenderness to palpation of the left hip or pelvis on exam.  Her abdomen is soft and nontender.  There is no ecchymosis or deformity seen.  Distally the patient was neurovascular intact with intact motor function.  She did not have significant pain with passive rotation of her left hip.  Assessment: Minimally displaced left superior and inferior pubic rami fractures.  Plan: I reviewed the plain x-rays of the pelvis along with the CT scans performed.  Patient has minimally displaced fractures of the superior and inferior pubic rami.  These will not require surgical intervention.  Patient's assisted hematoma appears stable and the patient is  hemodynamically stable.  There has been no evidence of drop in hematocrit or hemoglobin.  If the patient demonstrates a significant decrease in hemoglobin and hematocrit I would recommend contacting vascular surgery for possible embolization.  From an orthopedic standpoint the patient may be weightbearing as tolerated on her left lower extremity despite her fractures.  I would recommend physical therapy consultation to assist the patient with gait training and lower extremity strengthening.  Patient may follow-up in my office 1 month after discharge for repeat x-rays of the pelvis.  The patient's daughter understood and agreed with this plan.    [place], MD    08/23/2021 6:31 PM

## 2021-08-23 NOTE — Assessment & Plan Note (Signed)
Not acutely exacerbated. Continue Spiriva.  As needed DuoNebs

## 2021-08-23 NOTE — Assessment & Plan Note (Addendum)
Pelvic hematoma CTA pelvis showed no active bleeding.  Patient's baseline hemoglobin around 12.5 and had dropped to 10.6 on admission.  No further evidence of bleeding.  SCDs for DVT prophylaxis.

## 2021-08-23 NOTE — TOC Initial Note (Signed)
Transition of Care [facility]) - Initial/Assessment Note    Patient Details  Name: Natalie [name] MRN: [id number] Date of Birth: 11-14-1929  [facility] CM/SW Contact:    Eileen Stanford, LCSW Phone Number: 08/23/2021, 3:20 PM  Clinical Narrative:   CSW spoke with pt's daughter [name] whom was present at bedside. Pt is hard of hearing. Pt's daughter believes pt will need SNF at d/c. Pt's daughter agreeable to [place], [place], and [facility] out. CSW will provide bed offers once available. Will need PT eval prior to submitting for auth.                 Expected Discharge Plan: Skilled Nursing Facility Barriers to Discharge: Continued Medical Work up   Patient Goals and CMS Choice Patient states their goals for this hospitalization and ongoing recovery are:: pt to get some rehab   Choice offered to / list presented to : Adult Children  Expected Discharge Plan and Services Expected Discharge Plan: [name] Referral: Clinical Social Work   Post Acute Care Choice: [place] Living arrangements for the past 2 months: [place]                                      Prior Living Arrangements/Services Living arrangements for the past 2 months: Single Family Home Lives with:: Adult Children Patient language and need for interpreter reviewed:: Yes Do you feel safe going back to the place where you live?: Yes      Need for Family Participation in Patient Care: Yes (Comment) Care giver support system in place?: Yes (comment)   Criminal Activity/Legal Involvement Pertinent to Current Situation/Hospitalization: No - Comment as needed  Activities of Daily Living Home Assistive Devices/Equipment: Eyeglasses, Gilford Rile (specify type) ADL Screening (condition at time of admission) Patient's cognitive ability adequate to safely complete daily activities?: Yes Is the patient deaf or have difficulty hearing?: No Does the  patient have difficulty seeing, even when wearing glasses/contacts?: No Does the patient have difficulty concentrating, remembering, or making decisions?: Yes Patient able to express need for assistance with ADLs?: Yes Does the patient have difficulty dressing or bathing?: Yes Independently performs ADLs?: No Communication: Independent Dressing (OT): Needs assistance Is this a change from baseline?: Pre-admission baseline Feeding: Independent Bathing: Needs assistance Is this a change from baseline?: Change from baseline, expected to last <3 days Toileting: Needs assistance Is this a change from baseline?: Change from baseline, expected to last >3days In/Out Bed: Needs assistance Is this a change from baseline?: Change from baseline, expected to last >3 days Walks in Home: Dependent Is this a change from baseline?: Change from baseline, expected to last >3 days Does the patient have difficulty walking or climbing stairs?: Yes Weakness of Legs: Both Weakness of Arms/Hands: None  Permission Sought/Granted Permission sought to share information with : Family Supports Permission granted to share information with : Yes, Release of Information Signed  Share Information with NAME: sybil     Permission granted to share info w Relationship: daughter     Emotional Assessment Appearance:: Appears stated age Attitude/Demeanor/Rapport: Unable to Assess Affect (typically observed): Unable to Assess Orientation: :  (pt hard of hearing)   Psych Involvement: No (comment)  Admission diagnosis:  Left hip pain [M25.552] Fall, initial encounter [W19.XXXA] Closed fracture of multiple pubic rami, left, initial encounter ([place]) [S32.592A] Hematoma of bladder wall, initial encounter [  S37.22XA] Chronic obstructive pulmonary disease, unspecified COPD type ([place]) [J44.9] Closed nondisplaced fracture of pelvis, unspecified part of pelvis, initial encounter ([place]) [S32.9XXA] Patient Active Problem List    Diagnosis Date Noted   Accidental fall 08/23/2021   Closed fracture of multiple pubic rami, left, initial encounter ([place]) 08/23/2021   ABLA (acute blood loss anemia) 08/23/2021   Pelvic hematoma, traumatic 08/23/2021   Dementia ([place]) 08/09/2021   Pernicious anemia 07/22/2021   Bronchitis 07/18/2021   Headache 06/15/2021   CKD (chronic kidney disease) 10/12/2020   Breast mass, right 10/21/2019   Wound of right ankle 07/17/2019   Dysuria 03/28/2019   Carotid artery stenosis 02/28/2019   Carotid stenosis, symptomatic w/o infarct, left 10/17/2018   Chest wall pain 09/20/2018   Encounter for medical examination to establish care 02/27/2018   Hearing loss 02/27/2018   Screening for osteoporosis 02/27/2018   Depression, major, single episode, mild ([place]) 02/27/2018   Essential hypertension 02/27/2018   Memory changes 02/27/2018   COPD (chronic obstructive pulmonary disease) ([place]) 02/27/2018   HLD (hyperlipidemia) 02/27/2018   History of TIA (transient ischemic attack) 02/27/2018   Prediabetes 11/17/2015   Seroma 12/18/2014   Personal history of malignant neoplasm of breast 08/22/2014   History of fracture of right hip 05/26/2014   Peripheral vascular disease ([place]) 05/26/2014   Depressive disorder, not elsewhere classified 07/03/2013   TIA (transient ischemic attack) 08/21/2012   Nail fungus 03/08/2012   At risk for falls 06/15/2011   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

## 2021-08-23 NOTE — Assessment & Plan Note (Addendum)
Secondary to accidental fall.  Fortunately, not felt to be requiring surgical intervention.  Seen by PT & OT who recommended skilled nursing.

## 2021-08-23 NOTE — NC FL2 (Cosign Needed Addendum)
[place] LEVEL OF CARE SCREENING TOOL     IDENTIFICATION  Patient Name: Natalie [name] Birthdate: [birth year] Sex: female Admission Date (Current Location): 08/22/2021  [facility] and [place] Number:  Engineering geologist and Address:  [facility], [address], [place], [place] [address]      Provider Number: [id number]  Attending Physician Name and Address:  Shawna Clamp, MD  Relative Name and Phone Number:       Current Level of Care: Hospital Recommended Level of Care: [place] Prior Approval Number:    Date Approved/Denied:   PASRR Number: [id number]  Discharge Plan: SNF    Current Diagnoses: Patient Active Problem List   Diagnosis Date Noted   Accidental fall 08/23/2021   Closed fracture of multiple pubic rami, left, initial encounter ([place]) 08/23/2021   ABLA (acute blood loss anemia) 08/23/2021   Pelvic hematoma, traumatic 08/23/2021   Dementia ([place]) 08/09/2021   Pernicious anemia 07/22/2021   Bronchitis 07/18/2021   Headache 06/15/2021   CKD (chronic kidney disease) 10/12/2020   Breast mass, right 10/21/2019   Wound of right ankle 07/17/2019   Dysuria 03/28/2019   Carotid artery stenosis 02/28/2019   Carotid stenosis, symptomatic w/o infarct, left 10/17/2018   Chest wall pain 09/20/2018   Encounter for medical examination to establish care 02/27/2018   Hearing loss 02/27/2018   Screening for osteoporosis 02/27/2018   Depression, major, single episode, mild ([place]) 02/27/2018   Essential hypertension 02/27/2018   Memory changes 02/27/2018   COPD (chronic obstructive pulmonary disease) ([place]) 02/27/2018   HLD (hyperlipidemia) 02/27/2018   History of TIA (transient ischemic attack) 02/27/2018   Prediabetes 11/17/2015   Seroma 12/18/2014   Personal history of malignant neoplasm of breast 08/22/2014   History of fracture of right hip 05/26/2014   Peripheral vascular disease ([place]) 05/26/2014    Depressive disorder, not elsewhere classified 07/03/2013   TIA (transient ischemic attack) 08/21/2012   Nail fungus 03/08/2012   At risk for falls 06/15/2011   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   Cancer of breast ([place]) 01/02/2011   Carotid atherosclerosis 01/02/2011   Chronic hoarseness 01/02/2011   Esophageal reflux 01/02/2011    Orientation RESPIRATION BLADDER Height & Weight     Self, Time, Situation, Place  2L [place] Continent Weight: 125 lb (56.7 kg) Height:  '5\' 5"'$  (165.1 cm)  BEHAVIORAL SYMPTOMS/MOOD NEUROLOGICAL BOWEL NUTRITION STATUS      Continent Diet (heart healthy, thin liquids)  AMBULATORY STATUS COMMUNICATION OF NEEDS Skin     Verbally Normal                       Personal Care Assistance Level of Assistance  Dressing, Feeding, Bathing           Functional Limitations Info  Sight, Hearing, Speech Sight Info: Adequate Hearing Info: Impaired Speech Info: Adequate    SPECIAL CARE FACTORS FREQUENCY  PT (By licensed PT), OT (By licensed OT)     PT Frequency: 5x OT Frequency: 5x            Contractures Contractures Info: Not present    Additional Factors Info  Code Status, Allergies Code Status Info: full code Allergies Info: Influenza Vaccines           Current Medications (08/23/2021):  This is the current hospital active medication list Current Facility-Administered Medications  Medication Dose Route Frequency Provider Last Rate Last Admin   acetaminophen (TYLENOL) tablet 650 mg  650 mg Oral Q6H PRN Athena Masse, MD       Or   acetaminophen (TYLENOL) suppository 650 mg  650 mg Rectal Q6H PRN Athena Masse, MD       amLODipine (NORVASC) tablet 10 mg  10 mg Oral Daily Judd Gaudier V, MD   10 mg at 08/23/21 1131   atorvastatin (LIPITOR) tablet 40 mg  40 mg Oral Daily Judd Gaudier V, MD   40 mg at 08/23/21 1131   ipratropium-albuterol (DUONEB) 0.5-2.5 (3) MG/3ML nebulizer solution 3 mL  3 mL Nebulization QID PRN Athena Masse, MD        lidocaine (LIDODERM) 5 % 1 patch  1 patch Transdermal Q24H Vanessa Ackley, MD   1 patch at 08/22/21 2221   lisinopril (ZESTRIL) tablet 5 mg  5 mg Oral Daily Athena Masse, MD   5 mg at 08/23/21 1132   memantine (NAMENDA) tablet 5 mg  5 mg Oral BID Athena Masse, MD   5 mg at 08/23/21 1132   metoprolol tartrate (LOPRESSOR) tablet 25 mg  25 mg Oral BID Athena Masse, MD   25 mg at 08/23/21 1132   ondansetron (ZOFRAN) tablet 4 mg  4 mg Oral Q6H PRN Athena Masse, MD       Or   ondansetron [facility]) injection 4 mg  4 mg Intravenous Q6H PRN Athena Masse, MD       oxyCODONE (Oxy IR/ROXICODONE) immediate release tablet 5 mg  5 mg Oral Q12H PRN Shawna Clamp, MD       [START ON 08/24/2021] tiotropium (SPIRIVA) inhalation capsule ([facility] use ONLY) 18 mcg  1 capsule Inhalation Daily Athena Masse, MD         Discharge Medications: Please see discharge summary for a list of discharge medications.  Relevant Imaging Results:  Relevant Lab Results:   Additional Information [id number]  Eileen Stanford, LCSW

## 2021-08-23 NOTE — Assessment & Plan Note (Signed)
BP elevated.  Continue home amlodipine, lisinopril and metoprolol.  Likely secondary to pain.

## 2021-08-23 NOTE — Assessment & Plan Note (Signed)
Continue Namenda and trazodone

## 2021-08-23 NOTE — Plan of Care (Signed)

## 2021-08-23 NOTE — Care Plan (Signed)
This 88 years old female with PMH significant for dementia, hypertension, COPD, prior TIA, history of breast cancer presented in the ED s/p fall with left hip pain.  Patient denies any head injury or LOC.  Her main complaint is left hip pain.  ED work-up : CT A&P showed left superior and inferior pubic rami fracture with perivesical hematoma.  Follow-up CT angio showed no active bleeding.  CT head and C-spine no acute abnormality noted.  Patient is admitted for closed multiple pubic rami fracture, orthopedics is consulted.  Patient was seen and examined at bedside.  She reports pain is manageable with pain control.

## 2021-08-23 NOTE — H&P (Signed)
History and Physical    Patient: Natalie [name] NKN:[id number] DOB: 22-Feb-1930 DOA: 08/22/2021 DOS: the patient was seen and examined on 08/23/2021 PCP: [name], FNP  Patient coming from: Home  Chief Complaint:  Chief Complaint  Patient presents with   Hip Pain    HPI: Natalie [name] is a 88 y.o. female with medical history significant for Dementia hypertension, COPD, prior TIA, history of breast cancer who presents to the ED after she tripped and fell onto her left hip.  Denies hitting her head, denies loss of consciousness.  Had immediate onset of pain of the left hip.  She denies feeling previously ill.  Denies chest pain, shortness of breath, palpitations or lightheadedness.  Had no headache, visual changes or one-sided weakness numbness or tingling. ED course and data review: BP 168/72 with otherwise normal vitals Labs with WBC 11.2 initial hemoglobin 12.5 trending down to 10.6 on repeat other labs unremarkable Trauma imaging patient had CT pelvis without contrast that showed left superior and inferior pubic rami fracture with a perivesical hematoma.  Follow-up CT angio showed no active bleeding.  CT head and C-spine nonacute:  IMPRESSION: 1. Left pelvic hematoma similar to prior CT with associated mild mass effect on the bladder. No extravasation of contrast or evidence of active bleed. 2. Advanced atherosclerotic disease of the abdominal aorta and its branches as described. There is chronic thrombus and scarring of the proximal left common iliac artery with reconstitution of flow. 3. Indeterminate pancreatic and left renal lesions. Further characterization with MRI without and with contrast on a nonemergent/outpatient basis recommended  Patient treated with Tylenol, fentanyl and Lidoderm patch.  Hospitalist consulted for admission.   Review of Systems: As mentioned in the history of present illness. All other systems reviewed and are negative.  Past Medical  History:  Diagnosis Date   Allergies    hay fever   Arthritis    Breast cancer, left ([place]) 2015   Left total mastectomy   COPD (chronic obstructive pulmonary disease) ([place])    GERD (gastroesophageal reflux disease)    Hyperlipemia    Hypertension    Stroke ([place]) 2002   TIA   Past Surgical History:  Procedure Laterality Date   BREAST BIOPSY  2014   BREAST LUMPECTOMY  2014   left side   ENDARTERECTOMY Left 02/28/2019   Procedure: ENDARTERECTOMY CAROTID;  Surgeon: Katha Cabal, MD;  Location: [facility] ORS;  Service: Vascular;  Laterality: Left;   EVACUATION OF CERVICAL HEMATOMA Left 02/28/2019   Procedure: EVACUATION OF HEMATOMA;  Surgeon: Katha Cabal, MD;  Location: [facility] ORS;  Service: Vascular;  Laterality: Left;   EYE SURGERY Bilateral    cataract extractions   JOINT REPLACEMENT     MASTECTOMY Left May 9th 2017   [place]   TOTAL HIP ARTHROPLASTY Right    head of the femur replaced due to fracture   Social History:  reports that she has quit smoking. She has never used smokeless tobacco. She reports current alcohol use. She reports that she does not use drugs.  Allergies  Allergen Reactions   Influenza Vaccines Shortness Of Breath    Family History  Problem Relation Age of Onset   Breast cancer Mother    Lung cancer Father     Prior to Admission medications   Medication Sig Start Date End Date Taking? Authorizing Provider  amLODipine (NORVASC) 10 MG tablet TAKE 1 TABLET BY MOUTH EVERY DAY 03/24/21  [name], FNP  aspirin EC 81 MG tablet Take 81 mg by mouth daily. In preparation for surgery 12/18/ After stopping plavix    [provider]  atorvastatin (LIPITOR) 40 MG tablet TAKE 1 TABLET BY MOUTH EVERY DAY 07/08/21   [name], FNP  Calcium Citrate-Vitamin D 250-200 MG-UNIT TABS Take 250 mg by mouth every morning.    [provider]  cholecalciferol (VITAMIN D3) 25 MCG (1000 UT) tablet Take  1,000 Units by mouth daily.     [provider]  fluticasone (FLONASE) 50 MCG/ACT nasal spray Place 2 sprays into both nostrils daily.    [provider]  ipratropium-albuterol (DUONEB) 0.5-2.5 (3) MG/3ML SOLN Take 3 mLs by nebulization 4 (four) times daily as needed. 07/27/21   [name], FNP  lisinopril (ZESTRIL) 5 MG tablet TAKE 1 TABLET BY MOUTH EVERY DAY 07/05/21   [name], FNP  memantine (NAMENDA) 5 MG tablet Take 1 tablet (5 mg at night) for 2 weeks, then increase to 1 tablet (5 mg) twice a day 08/05/21   Rondel Jumbo, PA-C  metoprolol tartrate (LOPRESSOR) 50 MG tablet TAKE 1/2 TABLET BY MOUTH TWICE A DAY 07/08/21   [name], FNP  Tiotropium Bromide Monohydrate 2.5 MCG/ACT AERS Inhale 2 puffs into the lungs daily. 07/20/21   [name], FNP  traZODone (DESYREL) 50 MG tablet Take 0.5 tablets (25 mg total) by mouth at bedtime. Patient taking differently: Take 25 mg by mouth at bedtime as needed. 06/15/21   [name], FNP    Physical Exam: Vitals:   08/22/21 1538 08/22/21 2046 08/22/21 2224 08/22/21 2327  BP: (!) 168/72 (!) 180/65 (!) 191/70 (!) 160/98  Pulse: 96 91 98 95  Resp: '20 20 19 19  '$ Temp: (!) 97.5 F (36.4 C) 99.1 F (37.3 C)    TempSrc: Oral Oral    SpO2: 95% 96% 96% 96%  Weight:      Height:       Physical Exam Vitals and nursing note reviewed.  Constitutional:      General: She is not in acute distress. HENT:     Head: Normocephalic and atraumatic.  Cardiovascular:     Rate and Rhythm: Normal rate and regular rhythm.     Heart sounds: Normal heart sounds.  Pulmonary:     Effort: Pulmonary effort is normal.     Breath sounds: Normal breath sounds.  Abdominal:     Palpations: Abdomen is soft.     Tenderness: There is no abdominal tenderness.  Neurological:     Mental Status: Mental status is at baseline.     Labs on Admission: I have personally reviewed following labs and imaging  studies  CBC: Recent Labs  Lab 08/22/21 2203  WBC 11.2*  NEUTROABS 9.6*  HGB 10.6*  HCT 31.8*  MCV 93.0  PLT 458   Basic Metabolic Panel: Recent Labs  Lab 08/22/21 2203  NA 137  K 4.1  CL 107  CO2 25  GLUCOSE 134*  BUN 24*  CREATININE 0.93  CALCIUM 9.8   GFR: Estimated Creatinine Clearance: 36 mL/min (by C-G formula based on SCr of 0.93 mg/dL). Liver Function Tests: Recent Labs  Lab 08/22/21 2203  AST 23  ALT 19  ALKPHOS 55  BILITOT 1.0  PROT 7.2  ALBUMIN 4.1   No results for input(s): "LIPASE", "AMYLASE" in the last 168 hours. No results for input(s): "AMMONIA" in the last 168 hours. Coagulation Profile: Recent  Labs  Lab 08/22/21 2203  INR 1.1   Cardiac Enzymes: No results for input(s): "CKTOTAL", "CKMB", "CKMBINDEX", "TROPONINI" in the last 168 hours. BNP (last 3 results) No results for input(s): "PROBNP" in the last 8760 hours. HbA1C: No results for input(s): "HGBA1C" in the last 72 hours. CBG: No results for input(s): "GLUCAP" in the last 168 hours. Lipid Profile: No results for input(s): "CHOL", "HDL", "LDLCALC", "TRIG", "CHOLHDL", "LDLDIRECT" in the last 72 hours. Thyroid Function Tests: No results for input(s): "TSH", "T4TOTAL", "FREET4", "T3FREE", "THYROIDAB" in the last 72 hours. Anemia Panel: No results for input(s): "VITAMINB12", "FOLATE", "FERRITIN", "TIBC", "IRON", "RETICCTPCT" in the last 72 hours. Urine analysis:    Component Value Date/Time   COLORURINE YELLOW 11/16/2020 1558   APPEARANCEUR CLEAR 11/16/2020 1558   LABSPEC 1.020 11/16/2020 1558   PHURINE 5.5 11/16/2020 1558   GLUCOSEU NEGATIVE 11/16/2020 1558   GLUCOSEU NEGATIVE 05/13/2019 0940   HGBUR TRACE (A) 11/16/2020 1558   BILIRUBINUR NEGATIVE 11/16/2020 1558   BILIRUBINUR small 03/21/2019 1540   KETONESUR NEGATIVE 11/16/2020 1558   PROTEINUR NEGATIVE 11/16/2020 1558   UROBILINOGEN 0.2 05/13/2019 0940   NITRITE NEGATIVE 11/16/2020 1558   LEUKOCYTESUR SMALL (A)  11/16/2020 1558    Radiological Exams on Admission: CT Angio Abd/Pel W and/or Wo Contrast  Result Date: 08/23/2021 CLINICAL DATA:  Pelvic fracture.  Rule out arterial bleed. EXAM: CTA ABDOMEN AND PELVIS WITHOUT AND WITH CONTRAST TECHNIQUE: Multidetector CT imaging of the abdomen and pelvis was performed using the standard protocol during bolus administration of intravenous contrast. Multiplanar reconstructed images and MIPs were obtained and reviewed to evaluate the vascular anatomy. RADIATION DOSE REDUCTION: This exam was performed according to the departmental dose-optimization program which includes automated exposure control, adjustment of the mA and/or kV according to patient size and/or use of iterative reconstruction technique. CONTRAST:  66m OMNIPAQUE IOHEXOL 350 MG/ML SOLN COMPARISON:  Pelvic CT dated 08/22/2021. FINDINGS: VASCULAR Aorta: Advanced atherosclerotic calcification of the aorta. No aneurysmal dilatation or dissection. No periaortic fluid collection. Celiac: The celiac trunk and its major branches appear patent. SMA: Atherosclerotic calcification of the SMA.  The SMA is patent. Renals: Atherosclerotic calcification of the renal arteries. Focal high-grade narrowing of the right renal artery. The renal arteries are patent. IMA: The IMA is patent. Inflow: Advanced atherosclerotic calcification of the iliac arteries. There is chronic thrombus and scarring of the proximal left common iliac artery with reconstitution of flow. The external iliac arteries are patent bilaterally. Proximal Outflow: Atherosclerotic calcification. The visualized proximal outflow are patent. Veins: The IVC is unremarkable.  No portal venous gas. Review of the MIP images confirms the above findings. NON-VASCULAR Lower chest: Bibasilar linear atelectasis/scarring. Partially visualized traced pericardial effusion anterior to the heart. No intra-abdominal free air or free fluid. Hepatobiliary: The liver is unremarkable.  The gallbladder is unremarkable. Pancreas: There is a 1.4 cm hypodense lesion in the head of the pancreas, not characterized on this CT but may represent a side branch IPMN. This can be better evaluated with pancreatic protocol MRI on a nonemergent/outpatient basis. No acute inflammatory changes. Spleen: Normal in size without focal abnormality. Adrenals/Urinary Tract: Mild bilateral adrenal thickening/hyperplasia. There is a 2.5 x 2.3 cm exophytic lesion from the superior pole of the left kidney which is indeterminate and not characterized on this CT. Further initial characterization with ultrasound on a nonemergent/outpatient basis recommended. MRI may provide additional evaluation depending on ultrasound findings. There is no hydronephrosis on either side. The visualized ureters appear unremarkable. Mild thickened and  trabeculated appearance of the bladder wall. Stomach/Bowel: Small hiatal hernia. There is moderate stool throughout the colon. No bowel obstruction or active inflammation. The appendix is not visualized with certainty. No inflammatory changes identified in the right lower quadrant. Lymphatic: No adenopathy. Reproductive: The uterus is anteverted. Small calcified fibroid. No obvious adnexal masses. Other: Left pelvic hematoma similar to prior CT with associated mild mass effect on the bladder. No extravasation of contrast or evidence of active bleed. Musculoskeletal: Osteopenia with degenerative changes of the spine. Total right hip arthroplasty with associated streak artifact limiting evaluation of the pelvic structure. Acute left pubic bone fractures as seen on the earlier CT. Old-appearing compression fracture of superior endplate of L1. IMPRESSION: 1. Left pelvic hematoma similar to prior CT with associated mild mass effect on the bladder. No extravasation of contrast or evidence of active bleed. 2. Advanced atherosclerotic disease of the abdominal aorta and its branches as described. There is  chronic thrombus and scarring of the proximal left common iliac artery with reconstitution of flow. 3. Indeterminate pancreatic and left renal lesions. Further characterization with MRI without and with contrast on a nonemergent/outpatient basis recommended. Electronically Signed   By: Anner Crete M.D.   On: 08/23/2021 00:03   CT HEAD WO CONTRAST (5MM)  Result Date: 08/22/2021 CLINICAL DATA:  Fall EXAM: CT HEAD WITHOUT CONTRAST CT CERVICAL SPINE WITHOUT CONTRAST TECHNIQUE: Multidetector CT imaging of the head and cervical spine was performed following the standard protocol without intravenous contrast. Multiplanar CT image reconstructions of the cervical spine were also generated. RADIATION DOSE REDUCTION: This exam was performed according to the departmental dose-optimization program which includes automated exposure control, adjustment of the mA and/or kV according to patient size and/or use of iterative reconstruction technique. COMPARISON:  Brain MRI 06/15/2021 FINDINGS: CT HEAD FINDINGS Brain: No acute territorial infarction, hemorrhage or intracranial mass. Moderate atrophy. Extensive white matter hypodensity consistent with chronic small vessel ischemic change. Prominent ventricles, but felt secondary to atrophy Vascular: No hyperdense vessels.  Carotid vascular calcification. Skull: Normal. Negative for fracture or focal lesion. Sinuses/Orbits: No acute finding. Old appearing right nasal bone fracture. Other: None CT CERVICAL SPINE FINDINGS Alignment: Mild reversal of cervical lordosis. Trace anterolisthesis C2 on C3. Trace retrolisthesis C4 on C5 and C5 on C6. Facet alignment within normal limits Skull base and vertebrae: No acute fracture. No primary bone lesion or focal pathologic process. Soft tissues and spinal canal: No prevertebral fluid or swelling. No visible canal hematoma. Disc levels: Multilevel degenerative change. Advanced disc space narrowing C3 through C7. Facet degenerative changes  at multiple levels with foraminal stenosis. Advanced C1-C2 degenerative change. Chronic appearing calcification superior and anterior to the tip of the dens. Upper chest: Emphysema. Scarring and calcification at the right apex Other: None IMPRESSION: 1. No CT evidence for acute intracranial abnormality. Atrophy and advanced chronic small vessel ischemic changes of the white matter 2. Mild reversal of cervical lordosis with multilevel degenerative changes. No acute fracture 3. Emphysema Electronically Signed   By: Donavan Foil M.D.   On: 08/22/2021 21:04   CT Cervical Spine Wo Contrast  Result Date: 08/22/2021 CLINICAL DATA:  Fall EXAM: CT HEAD WITHOUT CONTRAST CT CERVICAL SPINE WITHOUT CONTRAST TECHNIQUE: Multidetector CT imaging of the head and cervical spine was performed following the standard protocol without intravenous contrast. Multiplanar CT image reconstructions of the cervical spine were also generated. RADIATION DOSE REDUCTION: This exam was performed according to the departmental dose-optimization program which includes automated exposure control, adjustment of the  mA and/or kV according to patient size and/or use of iterative reconstruction technique. COMPARISON:  Brain MRI 06/15/2021 FINDINGS: CT HEAD FINDINGS Brain: No acute territorial infarction, hemorrhage or intracranial mass. Moderate atrophy. Extensive white matter hypodensity consistent with chronic small vessel ischemic change. Prominent ventricles, but felt secondary to atrophy Vascular: No hyperdense vessels.  Carotid vascular calcification. Skull: Normal. Negative for fracture or focal lesion. Sinuses/Orbits: No acute finding. Old appearing right nasal bone fracture. Other: None CT CERVICAL SPINE FINDINGS Alignment: Mild reversal of cervical lordosis. Trace anterolisthesis C2 on C3. Trace retrolisthesis C4 on C5 and C5 on C6. Facet alignment within normal limits Skull base and vertebrae: No acute fracture. No primary bone lesion or  focal pathologic process. Soft tissues and spinal canal: No prevertebral fluid or swelling. No visible canal hematoma. Disc levels: Multilevel degenerative change. Advanced disc space narrowing C3 through C7. Facet degenerative changes at multiple levels with foraminal stenosis. Advanced C1-C2 degenerative change. Chronic appearing calcification superior and anterior to the tip of the dens. Upper chest: Emphysema. Scarring and calcification at the right apex Other: None IMPRESSION: 1. No CT evidence for acute intracranial abnormality. Atrophy and advanced chronic small vessel ischemic changes of the white matter 2. Mild reversal of cervical lordosis with multilevel degenerative changes. No acute fracture 3. Emphysema Electronically Signed   By: Donavan Foil M.D.   On: 08/22/2021 21:04   CT PELVIS WO CONTRAST  Result Date: 08/22/2021 CLINICAL DATA:  Left hip pain EXAM: CT PELVIS WITHOUT CONTRAST TECHNIQUE: Multidetector CT imaging of the pelvis was performed following the standard protocol without intravenous contrast. RADIATION DOSE REDUCTION: This exam was performed according to the departmental dose-optimization program which includes automated exposure control, adjustment of the mA and/or kV according to patient size and/or use of iterative reconstruction technique. COMPARISON:  None Available. FINDINGS: Bones/Joint/Cartilage Generalized osteopenia. No left hip fracture or dislocation. Mildly comminuted fracture of the left superior and inferior pubic rami without significant displacement. Large perivesicular hematoma adjacent to the left anterolateral bladder extending cephalad. Right total hip arthroplasty without hardware failure or complication. Severe degenerative disease with disc height loss at L5-S1. Degenerative disease with disc height loss at L4-5. Normal alignment. No joint effusion. Ligaments Ligaments are suboptimally evaluated by CT. Muscles and Tendons Muscles are normal. No muscle atrophy.  No intramuscular fluid collection or hematoma. Soft tissue No other fluid collection or hematoma. No soft tissue mass. Abdominal aortic atherosclerosis. IMPRESSION: 1. Mildly comminuted fracture of the left superior and inferior pubic rami without significant displacement. Mildly comminuted fracture of the left superior and inferior pubic rami without significant displacement. Large perivesicular hematoma adjacent to the left anterolateral bladder extending cephalad. Electronically Signed   By: Kathreen Devoid M.D.   On: 08/22/2021 20:59   DG Hip Unilat With Pelvis 2-3 Views Left  Result Date: 08/22/2021 CLINICAL DATA:  Tripped and fell EXAM: DG HIP (WITH OR WITHOUT PELVIS) 2-3V LEFT COMPARISON:  None Available. FINDINGS: SI joints are non widened. Pubic symphysis is intact. There is a right hip replacement with normal alignment. Probable acute nondisplaced fracture involving the left superior and inferior pubic rami. IMPRESSION: Findings suspicious for nondisplaced fractures involving the left superior and inferior pubic rami Electronically Signed   By: Donavan Foil M.D.   On: 08/22/2021 16:12     Data Reviewed: Relevant notes from primary care and specialist visits, past discharge summaries as available in EHR, including Care Everywhere. Prior diagnostic testing as pertinent to current admission diagnoses Updated medications and problem  lists for reconciliation ED course, including vitals, labs, imaging, treatment and response to treatment Triage notes, nursing and pharmacy notes and ED provider's notes Notable results as noted in HPI   Assessment and Plan: * Closed fracture of multiple pubic rami, left, initial encounter ([place]) Accidental fall PT eval Pain control Ortho consult to follow  ABLA (acute blood loss anemia) Pelvic hematoma CTA pelvis showed no active bleeding Hemoglobin 12.5-10.6 Gentle hydration to up to 500 mils Continue to trend hemoglobin and transfuse if necessary SCDs  for DVT prophylaxis  Dementia (HCC) Continue Namenda and trazodone  History of TIA (transient ischemic attack) Will hold aspirin until sure bleeding is controlled Continue statin  COPD (chronic obstructive pulmonary disease) ([place]) Not acutely exacerbated. Continue Spiriva.  As needed DuoNebs  Essential hypertension BP elevated.  Continue home amlodipine, lisinopril and metoprolol        DVT prophylaxis: SCDs  Consults: Dr. Mack Guise, orthopedics  Advance Care Planning:   Code Status: Prior   Family Communication: none  Disposition Plan: Back to previous home environment  Severity of Illness: The appropriate patient status for this patient is INPATIENT. Inpatient status is judged to be reasonable and necessary in order to provide the required intensity of service to ensure the patient's safety. The patient's presenting symptoms, physical exam findings, and initial radiographic and laboratory data in the context of their chronic comorbidities is felt to place them at high risk for further clinical deterioration. Furthermore, it is not anticipated that the patient will be medically stable for discharge from the hospital within 2 midnights of admission.   * I certify that at the point of admission it is my clinical judgment that the patient will require inpatient hospital care spanning beyond 2 midnights from the point of admission due to high intensity of service, high risk for further deterioration and high frequency of surveillance required.*  Author: Athena Masse, MD 08/23/2021 12:51 AM  For on call review [email or web address].

## 2021-08-23 NOTE — Progress Notes (Signed)
Admission profile updated. ?

## 2021-08-24 DIAGNOSIS — F0390 Unspecified dementia without behavioral disturbance: Secondary | ICD-10-CM

## 2021-08-24 DIAGNOSIS — S32592A Other specified fracture of left pubis, initial encounter for closed fracture: Secondary | ICD-10-CM | POA: Diagnosis not present

## 2021-08-24 DIAGNOSIS — D62 Acute posthemorrhagic anemia: Secondary | ICD-10-CM

## 2021-08-24 DIAGNOSIS — I5032 Chronic diastolic (congestive) heart failure: Secondary | ICD-10-CM | POA: Diagnosis not present

## 2021-08-24 LAB — CBC
HCT: 33.3 % — ABNORMAL LOW (ref 36.0–46.0)
Hemoglobin: 11 g/dL — ABNORMAL LOW (ref 12.0–15.0)
MCH: 30.6 pg (ref 26.0–34.0)
MCHC: 33 g/dL (ref 30.0–36.0)
MCV: 92.5 fL (ref 80.0–100.0)
Platelets: 148 10*3/uL — ABNORMAL LOW (ref 150–400)
RBC: 3.6 MIL/uL — ABNORMAL LOW (ref 3.87–5.11)
RDW: 12.6 % (ref 11.5–15.5)
WBC: 11 10*3/uL — ABNORMAL HIGH (ref 4.0–10.5)
nRBC: 0 % (ref 0.0–0.2)

## 2021-08-24 LAB — BASIC METABOLIC PANEL
Anion gap: 7 (ref 5–15)
BUN: 21 mg/dL (ref 8–23)
CO2: 24 mmol/L (ref 22–32)
Calcium: 9.2 mg/dL (ref 8.9–10.3)
Chloride: 105 mmol/L (ref 98–111)
Creatinine, Ser: 0.92 mg/dL (ref 0.44–1.00)
GFR, Estimated: 59 mL/min — ABNORMAL LOW (ref >60)
Glucose, Bld: 123 mg/dL — ABNORMAL HIGH (ref 70–99)
Potassium: 4.2 mmol/L (ref 3.5–5.1)
Sodium: 136 mmol/L (ref 135–145)

## 2021-08-24 LAB — PHOSPHORUS: Phosphorus: 3.6 mg/dL (ref 2.5–4.6)

## 2021-08-24 LAB — MAGNESIUM: Magnesium: 2 mg/dL (ref 1.7–2.4)

## 2021-08-24 NOTE — Progress Notes (Signed)
Physical Therapy Treatment Patient Details Name: Natalie [name] MRN: [id number] DOB: Aug 01, 1929 Today's Date: 08/24/2021   History of Present Illness Pt is a 88 y/o F who presents with L hip fx. PMH significant for Dementia hypertension, COPD, prior TIA, history of breast cancer.    PT Comments    Pt received for second session this afternoon. Pt upright in recliner pleasant and agreeable to session with minor L hip and pelvis pain. Pt provided HEP packet performing exercises. Pt requiring frequent multimodal cues for correct completion. Pt standing with minguard but requiring min VC's for scooting anteriorly in recliner prior to attempts. Pt does demonstrate some SOB in standing with SPO2 noted at 87-88%. Pt returned to sitting donning 2 L/min via [place]. Spo2 returned to 95% with pt returning to slow but steady step pivot transfer to EoB. MinA to return to supine for LE's. Deferred further mobility today due to minor dizziness and clear fatigue with transfer. Pt left with family and all needs in reach resting on 2L/min. RN made aware. D/c recs remain appropriate.     Recommendations for follow up therapy are one component of a multi-disciplinary discharge planning process, led by the attending physician.  Recommendations may be updated based on patient status, additional functional criteria and insurance authorization.  Follow Up Recommendations  Skilled nursing-short term rehab (<3 hours/day)     Assistance Recommended at Discharge Frequent or constant Supervision/Assistance  Patient can return home with the following A little help with walking and/or transfers;Assistance with cooking/housework;A little help with bathing/dressing/bathroom;Help with stairs or ramp for entrance   Equipment Recommendations  BSC/3in1 (shower seat)    Recommendations for Other Services       Precautions / Restrictions Precautions Precautions: Fall Restrictions Weight Bearing Restrictions: Yes LLE Weight  Bearing: Weight bearing as tolerated     Mobility  Bed Mobility Overal bed mobility: Needs Assistance Bed Mobility: Sit to Supine       Sit to supine: Min assist   General bed mobility comments: at LE's Patient Response: Cooperative  Transfers Overall transfer level: Needs assistance Equipment used: Rolling walker (2 wheels) Transfers: Bed to chair/wheelchair/BSC Sit to Stand: Min guard   Step pivot transfers: Min guard       General transfer comment: from recliner to EOB    Ambulation/Gait Ambulation/Gait assistance: Supervision Gait Distance (Feet): 20 Feet Assistive device: Rolling walker (2 wheels) Gait Pattern/deviations: Step-to pattern, Narrow base of support, Trunk flexed, Decreased step length - right, Decreased step length - left, Decreased stance time - left       General Gait Details: deferred due to mild dizziness   Stairs             Wheelchair Mobility    Modified Rankin (Stroke Patients Only)       Balance Overall balance assessment: Needs assistance Sitting-balance support: No upper extremity supported, Feet supported Sitting balance-Leahy Scale: Fair     Standing balance support: Bilateral upper extremity supported, During functional activity Standing balance-Leahy Scale: Fair Standing balance comment: Relies on RW                            Cognition Arousal/Alertness: Awake/alert Behavior During Therapy: WFL for tasks assessed/performed Overall Cognitive Status: Within Functional Limits for tasks assessed  Exercises General Exercises - Lower Extremity Ankle Circles/Pumps: AROM, Strengthening, Both, 10 reps, Seated Quad Sets: AROM, Strengthening, Seated, Left, 10 reps Gluteal Sets: AROM, Strengthening, Both, 10 reps, Seated Short Arc Quad: AROM, Strengthening, Left, 10 reps, Seated Heel Slides: AAROM, Strengthening, Left, 10 reps, Seated Hip  ABduction/ADduction: AROM, Strengthening, Seated, Left, 10 reps Straight Leg Raises: AAROM, Strengthening, Left, 5 reps, Seated Other Exercises Other Exercises: Role of PT in acute setting, d/c recs, DME needs if pt were to return home    General Comments General comments (skin integrity, edema, etc.): desat to 88% on RA. 2L/min applied with SPo2 at 95%. RN aware.      Pertinent Vitals/Pain Pain Assessment Pain Assessment: Faces Pain Score: 4  Faces Pain Scale: Hurts a little bit Pain Location: L hip/pelvis Pain Descriptors / Indicators: Discomfort Pain Intervention(s): Limited activity within patient's tolerance, Monitored during session, Repositioned    Home Living                          Prior Function            PT Goals (current goals can now be found in the care plan section) Acute Rehab PT Goals Patient Stated Goal: to improve overall mobility PT Goal Formulation: With patient/family Time For Goal Achievement: 09/07/21 Potential to Achieve Goals: Good Progress towards PT goals: Progressing toward goals    Frequency    7X/week      PT Plan Current plan remains appropriate    Co-evaluation              AM-PAC PT "6 Clicks" Mobility   Outcome Measure  Help needed turning from your back to your side while in a flat bed without using bedrails?: A Little Help needed moving from lying on your back to sitting on the side of a flat bed without using bedrails?: A Little Help needed moving to and from a bed to a chair (including a wheelchair)?: A Little Help needed standing up from a chair using your arms (e.g., wheelchair or bedside chair)?: A Little Help needed to walk in hospital room?: A Lot Help needed climbing 3-5 steps with a railing? : Total 6 Click Score: 15    End of Session Equipment Utilized During Treatment: Gait belt Activity Tolerance: Patient tolerated treatment well Patient left: in chair;with call bell/phone within reach;with  chair alarm set Nurse Communication: Mobility status PT Visit Diagnosis: Other abnormalities of gait and mobility (R26.89);Muscle weakness (generalized) (M62.81);History of falling (Z91.81);Pain Pain - Right/Left: Left Pain - part of body: Hip     Time: 6720-9470 PT Time Calculation (min) (ACUTE ONLY): 26 min  Charges:  $Therapeutic Exercise: 23-37 mins                     Takuma Cifelli M. Fairly IV, PT, DPT Physical Therapist- [facility]  08/24/2021, 4:07 PM

## 2021-08-24 NOTE — Hospital Course (Addendum)
88 year old female with past medical history of dementia, hypertension, COPD and previous breast cancer presented to the emergency room the night of 6/12 after mechanical fall landing on her left side and unable to stand.  Work-up in the emergency room revealed left superior and inferior pubic rami fractures along with a perivesical hematoma.  CT angiogram and follow-up noted no active bleeding.  Patient was admitted to the hospitalist service.  Orthopedic surgery was consulted who fractures were minimally displaced and would not require surgical intervention.  From an orthopedic standpoint, patient felt to be weightbearing as tolerated and physical therapy was recommended with follow-up in the office in 1 month for repeat x-rays.  Patient was brought in for pain control and PT and OT evaluation who recommended skilled nursing facility.  Patient's perivesical hematoma felt to be stable with actual increase in her hemoglobin on 6/14.  Accepts for SNF on 6/15.

## 2021-08-24 NOTE — Progress Notes (Signed)
[facility] Progress Note  Patient: Natalie [name]    CMK:[id number]  DOA: 08/22/2021    Date of Service: the patient was seen and examined on 08/24/2021  Brief hospital course: 88 year old female with past medical history of dementia, hypertension, COPD and previous breast cancer presented to the emergency room the night of 6/12 after mechanical fall landing on her left side and unable to stand.  Work-up in the emergency room revealed left superior and inferior pubic rami fractures along with a perivesical hematoma.  CT angiogram and follow-up noted no active bleeding.  Patient was admitted to the hospitalist service.  Orthopedic surgery was consulted who fractures were minimally displaced and would not require surgical intervention.  From an orthopedic standpoint, patient felt to be weightbearing as tolerated and physical therapy was recommended with follow-up in the office in 1 month for repeat x-rays.  Patient was brought in for pain control and PT and OT evaluation who recommended skilled nursing facility.  Patient's perivesical hematoma felt to be stable with actual increase in her hemoglobin on 6/14.  Assessment and Plan: Assessment and Plan: Closed fracture of multiple pubic rami, left, initial encounter ([place]) Secondary to accidental fall.  Fortunately, not felt to be requiring surgical intervention.  Needs physical therapy through PT and OT.  ABLA (acute blood loss anemia) Pelvic hematoma CTA pelvis showed no active bleeding.  Patient's baseline hemoglobin around 12.5 and had dropped to 10.6 on admission.  No further evidence of bleeding.  SCDs for DVT prophylaxis.  COPD (chronic obstructive pulmonary disease) (HCC) Not acutely exacerbated. Continue Spiriva.  As needed DuoNebs  Chronic diastolic CHF (congestive heart failure) ([place]) Initially appeared euvolemic to dry given blood loss anemia.  Over the last 24 hours, patient has noted some mild hypoxia so 2 L of oxygen were  added.  Checking BNP as she did receive IV fluids.  Senile dementia without behavioral disturbance (HCC) Continue Namenda and trazodone  Essential hypertension BP elevated.  Continue home amlodipine, lisinopril and metoprolol.  Likely secondary to pain.  Accidental fall PT and OT  History of TIA (transient ischemic attack) Will hold aspirin until sure bleeding is controlled Continue statin       Body mass index is 20.8 kg/m.        Consultants: Orthopedic surgery  Procedures: None  Antimicrobials: None  Code Status: Full code   Subjective: Patient resting comfortably  Objective: Noted some elevated blood pressures Vitals:   08/24/21 0758 08/24/21 1612  BP: (!) 161/116 (!) 149/57  Pulse: 87 74  Resp: 17 19  Temp: 98.9 F (37.2 C) 98 F (36.7 C)  SpO2: 92% 95%    Intake/Output Summary (Last 24 hours) at 08/24/2021 1703 Last data filed at 08/24/2021 1615 Gross per 24 hour  Intake 120 ml  Output 150 ml  Net -30 ml   Filed Weights   08/22/21 1536  Weight: 56.7 kg   Body mass index is 20.8 kg/m.  Exam:  General: Resting comfortably, no acute distress HEENT: Normocephalic, atraumatic, mucous membranes are moist Cardiovascular: Regular rate and rhythm, S1-S2 Respiratory: Clear to auscultation bilaterally Abdomen: Soft, nontender, nondistended, positive bowel sounds Musculoskeletal: No clubbing or cyanosis, trace pitting edema Skin: Bruising, but no skin breaks, tears or lesions Psychiatry: Appropriate, no evidence of psychoses Neurology: No focal deficits  Data Reviewed: Stable hemoglobin actually improved from previous day  Disposition:  Status is: Inpatient Remains inpatient appropriate because: Need for skilled nursing    Anticipated discharge date: 6/16  Family Communication: Left message for family DVT Prophylaxis: SCDs Start: 08/23/21 0111    Author: Annita Brod ,MD 08/24/2021 5:03 PM  To reach On-call, see care  teams to locate the attending and reach out via [email or web address]. Between 7PM-7AM, please contact night-coverage If you still have difficulty reaching the attending provider, please page the [facility] (Director on Call) for [facility] on amion for assistance.

## 2021-08-24 NOTE — Assessment & Plan Note (Signed)
-   PT and OT

## 2021-08-24 NOTE — Evaluation (Signed)
Occupational Therapy Evaluation Patient Details Name: Natalie [name] MRN: [id number] DOB: [birth year] Today's Date: 08/24/2021   History of Present Illness Pt is a 88 y/o F who presents with L hip fx. PMH significant for Dementia hypertension, COPD, prior TIA, history of breast cancer.   Clinical Impression   Pt was seen for OT evaluation this date. Prior to hospital admission, pt was independent with ADLs. Pt lives with family in a one level townhome with a level entry. Family reports available to assist PRN at home. Pt presents to acute OT demonstrating impaired ADL performance and functional mobility 2/2 decreased activity tolerance and L hip fx - functional strength/ROM/balance deficits. Pt currently requires MIN A to don/doff socks for LLE; CGA + RW with min vcs for safe hand placement for toilet t/f - pt limited by nausea, needed prolonged seated rest break; SUPERVISION for pericare while seated. Pt reported 3/10 at rest and 5/10 pain with ambulation. Nurse notified of nausea and provided medication. Pt left in bathroom with PT. Noted poor insight into abilities and safety awareness throughout session. Pt motivated to participate in therapy. Pt would benefit from skilled OT to address noted impairments and functional limitations (see below for any additional details). Upon hospital discharge, recommend STR to maximize pt safety and return to PLOF. However, anticipate progression to [place].      Recommendations for follow up therapy are one component of a multi-disciplinary discharge planning process, led by the attending physician.  Recommendations may be updated based on patient status, additional functional criteria and insurance authorization.   Follow Up Recommendations  Skilled nursing-short term rehab (<3 hours/day)    Assistance Recommended at Discharge Intermittent Supervision/Assistance  Patient can return home with the following A little help with walking and/or transfers;A little  help with bathing/dressing/bathroom;Assistance with cooking/housework;Assist for transportation    Functional Status Assessment  Patient has had a recent decline in their functional status and demonstrates the ability to make significant improvements in function in a reasonable and predictable amount of time.  Equipment Recommendations  BSC/3in1    Recommendations for Other Services       Precautions / Restrictions Precautions Precautions: Fall Restrictions Weight Bearing Restrictions: Yes LLE Weight Bearing: Weight bearing as tolerated      Mobility Bed Mobility               General bed mobility comments: NT, pt was in chair at start of session    Transfers Overall transfer level: Needs assistance Equipment used: Rolling walker (2 wheels) Transfers: Sit to/from Stand Sit to Stand: Min guard                  Balance Overall balance assessment: Needs assistance Sitting-balance support: Feet supported, No upper extremity supported Sitting balance-Leahy Scale: Good     Standing balance support: Bilateral upper extremity supported, During functional activity, Reliant on assistive device for balance Standing balance-Leahy Scale: Fair                             ADL either performed or assessed with clinical judgement   ADL Overall ADL's : Needs assistance/impaired                                       General ADL Comments: MIN A to don/doff socks for LLE. CGA + RW with min vcs for  safe hand placement for toilet t/f - pt limited by nausea, needed 1 rest break. SUPERVISION for pericare while seated.      Pertinent Vitals/Pain Pain Assessment Pain Assessment: 0-10 Pain Score: 5  (Pt reported 3/10 pain at start of session and 5/10 with ambulation.) Pain Location: L hip Pain Descriptors / Indicators: Discomfort Pain Intervention(s): Limited activity within patient's tolerance, Monitored during session        Extremity/Trunk  Assessment Upper Extremity Assessment Upper Extremity Assessment: Generalized weakness   Lower Extremity Assessment Lower Extremity Assessment: Generalized weakness;LLE deficits/detail LLE Deficits / Details: L pelvic fracture LLE Sensation: WNL   Cervical / Trunk Assessment Cervical / Trunk Assessment: Normal   Communication Communication Communication: No difficulties;HOH   Cognition Arousal/Alertness: Awake/alert Behavior During Therapy: WFL for tasks assessed/performed Overall Cognitive Status: Within Functional Limits for tasks assessed                                                  Home Living Family/patient expects to be discharged to:: Private residence Living Arrangements: Children Available Help at Discharge: Family;Available PRN/intermittently Type of Home: House Home Access: Stairs to enter;Level entry Entrance Stairs-Number of Steps: 1 from garage, level entry in the front door   Home Layout: One level     Bathroom Shower/Tub: Occupational psychologist: Standard Bathroom Accessibility: Yes   Home Equipment: Conservation officer, nature (2 wheels);Rollator (4 wheels)   Additional Comments: Rollator outside when walking the dog      Prior Functioning/Environment Prior Level of Function : Independent/Modified Independent               ADLs Comments: Family assists in transportation        OT Problem List: Decreased strength;Decreased activity tolerance;Decreased range of motion      OT Treatment/Interventions: Self-care/ADL training;DME and/or AE instruction;Energy conservation;Balance training;Patient/family education;Therapeutic activities;Therapeutic exercise    OT Goals(Current goals can be found in the care plan section) Acute Rehab OT Goals Patient Stated Goal: To improve pain OT Goal Formulation: With patient Time For Goal Achievement: 09/07/21 Potential to Achieve Goals: Good ADL Goals Pt Will Perform Grooming:  standing;with modified independence Pt Will Perform Lower Body Dressing: with modified independence;sit to/from stand;with adaptive equipment Pt Will Transfer to Toilet: with modified independence;ambulating;regular height toilet (elevated height)  OT Frequency: Min 2X/week       AM-PAC OT "6 Clicks" Daily Activity     Outcome Measure Help from another person eating meals?: None Help from another person taking care of personal grooming?: A Little Help from another person toileting, which includes using toliet, bedpan, or urinal?: A Little Help from another person bathing (including washing, rinsing, drying)?: A Little Help from another person to put on and taking off regular upper body clothing?: A Little Help from another person to put on and taking off regular lower body clothing?: A Little 6 Click Score: 19   End of Session Equipment Utilized During Treatment: Gait belt;Rolling walker (2 wheels) Nurse Communication: Other (comment) (Nausea)  Activity Tolerance: Patient tolerated treatment well;Other (comment) (Pt limited by nausea) Patient left: Other (comment) (Pt left in bathroom with PT)  OT Visit Diagnosis: Unsteadiness on feet (R26.81)                Time: 1610-9604 OT Time Calculation (min): 31 min Charges:  OT General Charges $OT  Visit: 1 Visit OT Evaluation $OT Eval Moderate Complexity: 1 Mod OT Treatments $Self Care/Home Management : 23-37 mins  [employer], OTDS  [employer] 08/24/2021, 1:19 PM

## 2021-08-24 NOTE — Assessment & Plan Note (Addendum)
Initially appeared euvolemic to dry given blood loss anemia.  Over the last 24 hours, patient has noted some mild hypoxia so 2 L of oxygen were added.  BNP minimally elevated at 11 and she received 1 dose of Lasix before discharge.

## 2021-08-24 NOTE — TOC Progression Note (Addendum)
Transition of Care [facility]) - Progression Note    Patient Details  Name: Natalie [name] MRN: [id number] Date of Birth: 11/25/1929  Transition of [facility]) CM/SW Contact  Eileen Stanford, LCSW Phone Number: 08/24/2021, 1:56 PM  Clinical Narrative:   Bed offers provided to daughter and she chooses [facility]. Pt is managed by [facility] asked Magda Paganini to start auth.    Expected Discharge Plan: [place] Barriers to Discharge: Continued Medical Work up  Expected Discharge Plan and Services Expected Discharge Plan: [place] In-house Referral: Clinical Social Work   Post Acute Care Choice: [place] Living arrangements for the past 2 months: Single Family Home                                       Social Determinants of Health (SDOH) Interventions    Readmission Risk Interventions     No data to display

## 2021-08-24 NOTE — Evaluation (Signed)
Physical Therapy Evaluation Patient Details Name: Natalie [name] MRN: [id number] DOB: [birth year] Today's Date: 08/24/2021  History of Present Illness  Pt is a 88 y/o F who presents with L hip fx. PMH significant for Dementia hypertension, COPD, prior TIA, history of breast cancer.   Clinical Impression  Pt admitted with above diagnosis. Pt received seated on toilet in bathroom with family present. Pt agreeable to PT eval. Per family, at baseline pt is independent in ADL/IADL completion and household ambulation. Does rely on rollator outside of home. Pt lives with family but reports they all work so pt does not have constant assist/supervision if needed.   To date, Pt able to stand to RW from elevated 3 in 1 with minguard and VC's for safe hand placement. Pt ambulating step to and antalgic gait on LLE with decreased stance time and need for support from RW to maintain stability. Pt ambulating ~20-25' to recliner requiring supervision level of assistance returning to recliner with further cuing for hand placement with heavy need for UE's for eccentric control. RLE 5/5 MMT via hip flex, knee extension, ankle DF with LLE grossly 4/5 MMT hip flexion and knee extension and 5/5 ankle DF. Most likely hip flexion limited 2/2 pain at this time. Pain recorded at 4/10 NPS during gait. Plan to rec STR at discharge as pt has limited assist/supervision at home from family due to work constraints. If pt is able to improve independence in improved gait distances, toileting, transfers with LRAD, anticipate pt could make progress to d/c home with needed DME. Family does endorse pt has significantly elevated bed that pt would have trouble with entering/exiting if she were to return home but they do have a recliner that pt could sleep in if she is d/c'd home if improvement noted with overall safe mobility. Pt left in recliner with all needs in reach with family in room. Pt currently with functional limitations due to the  deficits listed below (see PT Problem List). Pt will benefit from skilled PT to increase their independence and safety with mobility to allow discharge to the venue listed below.       Recommendations for follow up therapy are one component of a multi-disciplinary discharge planning process, led by the attending physician.  Recommendations may be updated based on patient status, additional functional criteria and insurance authorization.  Follow Up Recommendations Skilled nursing-short term rehab (<3 hours/day)    Assistance Recommended at Discharge Frequent or constant Supervision/Assistance  Patient can return home with the following  A little help with walking and/or transfers;Assistance with cooking/housework;A little help with bathing/dressing/bathroom;Help with stairs or ramp for entrance    Equipment Recommendations BSC/3in1 (shower seat)  Recommendations for Other Services       Functional Status Assessment Patient has had a recent decline in their functional status and demonstrates the ability to make significant improvements in function in a reasonable and predictable amount of time.     Precautions / Restrictions Precautions Precautions: Fall Restrictions Weight Bearing Restrictions: Yes LLE Weight Bearing: Weight bearing as tolerated      Mobility  Bed Mobility               General bed mobility comments: NT. Patient Response: Cooperative  Transfers Overall transfer level: Needs assistance Equipment used: Rolling walker (2 wheels) Transfers: Sit to/from Stand Sit to Stand: Min guard           General transfer comment: from 3 in 1 in bathroom.    Ambulation/Gait Ambulation/Gait  assistance: Supervision Gait Distance (Feet): 20 Feet Assistive device: Rolling walker (2 wheels) Gait Pattern/deviations: Step-to pattern, Narrow base of support, Trunk flexed, Decreased step length - right, Decreased step length - left, Decreased stance time - left        General Gait Details: Steady gait with RW but slowed and antalgic.  Stairs            Wheelchair Mobility    Modified Rankin (Stroke Patients Only)       Balance Overall balance assessment: Needs assistance Sitting-balance support: No upper extremity supported, Feet supported Sitting balance-Leahy Scale: Fair     Standing balance support: Bilateral upper extremity supported, During functional activity Standing balance-Leahy Scale: Fair Standing balance comment: Relies on RW                             Pertinent Vitals/Pain Pain Assessment Pain Assessment: 0-10 Pain Score: 4  Pain Location: L hip/pelvis Pain Descriptors / Indicators: Discomfort Pain Intervention(s): Limited activity within patient's tolerance, Monitored during session, Repositioned    Home Living Family/patient expects to be discharged to:: Private residence Living Arrangements: Children Available Help at Discharge: Family;Available PRN/intermittently Type of Home: House Home Access: Stairs to enter;Level entry   Entrance Stairs-Number of Steps: 1 from garage, level entry in the front door   Home Layout: One level Home Equipment: Conservation officer, nature (2 wheels);Rollator (4 wheels) Additional Comments: Rollator outside when walking the dog    Prior Function Prior Level of Function : Independent/Modified Independent               ADLs Comments: Family assists in transportation     Hand Dominance        Extremity/Trunk Assessment   Upper Extremity Assessment Upper Extremity Assessment: Generalized weakness    Lower Extremity Assessment Lower Extremity Assessment: Generalized weakness;LLE deficits/detail LLE Deficits / Details: L pelvic fracture LLE Sensation: WNL    Cervical / Trunk Assessment Cervical / Trunk Assessment: Normal  Communication   Communication: No difficulties;HOH  Cognition Arousal/Alertness: Awake/alert Behavior During Therapy: WFL for tasks  assessed/performed Overall Cognitive Status: Within Functional Limits for tasks assessed                                          General Comments      Exercises Other Exercises Other Exercises: Role of PT in acute setting, d/c recs, DME needs if pt were to return home   Assessment/Plan    PT Assessment Patient needs continued PT services  PT Problem List Decreased strength;Decreased mobility;Decreased range of motion;Decreased activity tolerance;Decreased balance;Pain       PT Treatment Interventions DME instruction;Therapeutic exercise;Gait training;Balance training;Stair training;Neuromuscular re-education;Functional mobility training;Therapeutic activities;Patient/family education    PT Goals (Current goals can be found in the Care Plan section)  Acute Rehab PT Goals Patient Stated Goal: to improve overall mobility PT Goal Formulation: With patient/family Time For Goal Achievement: 09/07/21 Potential to Achieve Goals: Good    Frequency 7X/week     Co-evaluation               AM-PAC PT "6 Clicks" Mobility  Outcome Measure Help needed turning from your back to your side while in a flat bed without using bedrails?: A Little Help needed moving from lying on your back to sitting on the side of a flat bed without  using bedrails?: A Little Help needed moving to and from a bed to a chair (including a wheelchair)?: A Little Help needed standing up from a chair using your arms (e.g., wheelchair or bedside chair)?: A Little Help needed to walk in hospital room?: A Lot Help needed climbing 3-5 steps with a railing? : Total 6 Click Score: 15    End of Session Equipment Utilized During Treatment: Gait belt Activity Tolerance: Patient tolerated treatment well Patient left: in chair;with call bell/phone within reach;with chair alarm set Nurse Communication: Mobility status PT Visit Diagnosis: Other abnormalities of gait and mobility (R26.89);Muscle weakness  (generalized) (M62.81);History of falling (Z91.81);Pain Pain - Right/Left: Left Pain - part of body: Hip    Time: 4695-0722 PT Time Calculation (min) (ACUTE ONLY): 20 min   Charges:   PT Evaluation $PT Eval Low Complexity: 1 Low PT Treatments $Therapeutic Activity: 8-22 mins       Sharalee Witman M. Fairly IV, PT, DPT Physical Therapist- [facility]  08/24/2021, 11:13 AM

## 2021-08-24 NOTE — Telephone Encounter (Signed)
noted 

## 2021-08-25 DIAGNOSIS — S3289XA Fracture of other parts of pelvis, initial encounter for closed fracture: Secondary | ICD-10-CM | POA: Diagnosis not present

## 2021-08-25 DIAGNOSIS — S32592A Other specified fracture of left pubis, initial encounter for closed fracture: Secondary | ICD-10-CM | POA: Diagnosis not present

## 2021-08-25 DIAGNOSIS — K219 Gastro-esophageal reflux disease without esophagitis: Secondary | ICD-10-CM | POA: Diagnosis not present

## 2021-08-25 DIAGNOSIS — J309 Allergic rhinitis, unspecified: Secondary | ICD-10-CM | POA: Diagnosis not present

## 2021-08-25 DIAGNOSIS — I5042 Chronic combined systolic (congestive) and diastolic (congestive) heart failure: Secondary | ICD-10-CM | POA: Diagnosis not present

## 2021-08-25 DIAGNOSIS — I10 Essential (primary) hypertension: Secondary | ICD-10-CM | POA: Diagnosis not present

## 2021-08-25 DIAGNOSIS — W19XXXD Unspecified fall, subsequent encounter: Secondary | ICD-10-CM | POA: Diagnosis not present

## 2021-08-25 DIAGNOSIS — I110 Hypertensive heart disease with heart failure: Secondary | ICD-10-CM | POA: Diagnosis not present

## 2021-08-25 DIAGNOSIS — D62 Acute posthemorrhagic anemia: Secondary | ICD-10-CM | POA: Diagnosis not present

## 2021-08-25 DIAGNOSIS — F320 Major depressive disorder, single episode, mild: Secondary | ICD-10-CM | POA: Diagnosis not present

## 2021-08-25 DIAGNOSIS — S32810D Multiple fractures of pelvis with stable disruption of pelvic ring, subsequent encounter for fracture with routine healing: Secondary | ICD-10-CM | POA: Diagnosis not present

## 2021-08-25 DIAGNOSIS — E7800 Pure hypercholesterolemia, unspecified: Secondary | ICD-10-CM | POA: Diagnosis not present

## 2021-08-25 DIAGNOSIS — E162 Hypoglycemia, unspecified: Secondary | ICD-10-CM | POA: Diagnosis not present

## 2021-08-25 DIAGNOSIS — Z853 Personal history of malignant neoplasm of breast: Secondary | ICD-10-CM | POA: Diagnosis not present

## 2021-08-25 DIAGNOSIS — E559 Vitamin D deficiency, unspecified: Secondary | ICD-10-CM | POA: Diagnosis not present

## 2021-08-25 DIAGNOSIS — K661 Hemoperitoneum: Secondary | ICD-10-CM | POA: Diagnosis not present

## 2021-08-25 DIAGNOSIS — Z743 Need for continuous supervision: Secondary | ICD-10-CM | POA: Diagnosis not present

## 2021-08-25 DIAGNOSIS — F0393 Unspecified dementia, unspecified severity, with mood disturbance: Secondary | ICD-10-CM | POA: Diagnosis not present

## 2021-08-25 DIAGNOSIS — R1311 Dysphagia, oral phase: Secondary | ICD-10-CM | POA: Diagnosis not present

## 2021-08-25 DIAGNOSIS — I5032 Chronic diastolic (congestive) heart failure: Secondary | ICD-10-CM | POA: Diagnosis not present

## 2021-08-25 DIAGNOSIS — E161 Other hypoglycemia: Secondary | ICD-10-CM | POA: Diagnosis not present

## 2021-08-25 DIAGNOSIS — E538 Deficiency of other specified B group vitamins: Secondary | ICD-10-CM | POA: Diagnosis not present

## 2021-08-25 DIAGNOSIS — J449 Chronic obstructive pulmonary disease, unspecified: Secondary | ICD-10-CM | POA: Diagnosis not present

## 2021-08-25 DIAGNOSIS — Z96641 Presence of right artificial hip joint: Secondary | ICD-10-CM | POA: Diagnosis not present

## 2021-08-25 DIAGNOSIS — S32592D Other specified fracture of left pubis, subsequent encounter for fracture with routine healing: Secondary | ICD-10-CM | POA: Diagnosis not present

## 2021-08-25 LAB — BRAIN NATRIURETIC PEPTIDE: B Natriuretic Peptide: 111.6 pg/mL — ABNORMAL HIGH (ref 0.0–100.0)

## 2021-08-25 MED ORDER — FUROSEMIDE 10 MG/ML IJ SOLN
20.0000 mg | Freq: Once | INTRAMUSCULAR | Status: AC
  Administered 2021-08-25: 20 mg via INTRAVENOUS
  Filled 2021-08-25: qty 4

## 2021-08-25 MED ORDER — TRAMADOL HCL 50 MG PO TABS: 25.0000 mg | ORAL_TABLET | Freq: Three times a day (TID) | ORAL | 0 refills | Status: DC | PRN

## 2021-08-25 MED ORDER — LIDOCAINE 5 % EX PTCH: 1.0000 | MEDICATED_PATCH | CUTANEOUS | 0 refills | Status: DC

## 2021-08-25 MED ORDER — TRAZODONE HCL 50 MG PO TABS: 25.0000 mg | ORAL_TABLET | Freq: Every evening | ORAL | 0 refills | Status: DC | PRN

## 2021-08-25 MED ORDER — TRAZODONE HCL 50 MG PO TABS: 25.0000 mg | ORAL_TABLET | Freq: Every evening | ORAL | Status: DC | PRN

## 2021-08-25 NOTE — Progress Notes (Signed)
Physical Therapy Treatment Patient Details Name: Natalie [name] MRN: [id number] DOB: Feb 28, 1930 Today's Date: 08/25/2021   History of Present Illness Pt is a 88 y/o F who presents with L hip fx. PMH significant for Dementia hypertension, COPD, prior TIA, history of breast cancer.    PT Comments    Pt received upright in recliner agreeable to PT services. Pt standing to RW with VC's for hand placement and minguard. Pt demonstrating decreased gait speed today and increased antalgic gait on LLE. Pt overall able to ambulate to <> from bathroom independently performing toielting tasks in sitting. Pt unable to progress gait this day duye to reports of 7/10 Pain in L pelvis in [place]. Pt returning to recliner with RN at bedside for pain meds. D/cs recs remain appropriate.     Recommendations for follow up therapy are one component of a multi-disciplinary discharge planning process, led by the attending physician.  Recommendations may be updated based on patient status, additional functional criteria and insurance authorization.  Follow Up Recommendations  Skilled nursing-short term rehab (<3 hours/day)     Assistance Recommended at Discharge Frequent or constant Supervision/Assistance  Patient can return home with the following A little help with walking and/or transfers;Assistance with cooking/housework;A little help with bathing/dressing/bathroom;Help with stairs or ramp for entrance   Equipment Recommendations  BSC/3in1;Other (comment) (shower seat)    Recommendations for Other Services       Precautions / Restrictions Precautions Precautions: Fall Restrictions Weight Bearing Restrictions: Yes LLE Weight Bearing: Weight bearing as tolerated     Mobility  Bed Mobility               General bed mobility comments: NT. Pt in recliner pre and post session Patient Response: Cooperative  Transfers Overall transfer level: Needs assistance Equipment used: Rolling walker (2  wheels) Transfers: Sit to/from Stand Sit to Stand: Min guard                Ambulation/Gait Ambulation/Gait assistance: Supervision Gait Distance (Feet): 40 Feet Assistive device: Rolling walker (2 wheels) Gait Pattern/deviations: Step-to pattern, Narrow base of support, Trunk flexed, Decreased step length - right, Decreased step length - left, Decreased stance time - left           Stairs             Wheelchair Mobility    Modified Rankin (Stroke Patients Only)       Balance Overall balance assessment: Needs assistance Sitting-balance support: No upper extremity supported, Feet supported Sitting balance-Leahy Scale: Fair     Standing balance support: Bilateral upper extremity supported, During functional activity Standing balance-Leahy Scale: Fair Standing balance comment: Relies on RW                            Cognition Arousal/Alertness: Awake/alert Behavior During Therapy: WFL for tasks assessed/performed Overall Cognitive Status: Within Functional Limits for tasks assessed                                          Exercises General Exercises - Lower Extremity Ankle Circles/Pumps: AROM, Strengthening, Both, 10 reps, Seated Long Arc Quad: AROM, Strengthening, Both, 15 reps, Seated    General Comments        Pertinent Vitals/Pain Pain Assessment Pain Assessment: 0-10 Pain Score: 7  Pain Location: L hip/pelvis Pain Descriptors / Indicators: Discomfort,  Grimacing Pain Intervention(s): Limited activity within patient's tolerance, Monitored during session, Repositioned    Home Living                          Prior Function            PT Goals (current goals can now be found in the care plan section) Acute Rehab PT Goals Patient Stated Goal: to improve overall mobility PT Goal Formulation: With patient/family Time For Goal Achievement: 09/07/21 Potential to Achieve Goals: Good Progress towards PT  goals: Progressing toward goals    Frequency    7X/week      PT Plan Current plan remains appropriate    Co-evaluation              AM-PAC PT "6 Clicks" Mobility   Outcome Measure  Help needed turning from your back to your side while in a flat bed without using bedrails?: A Little Help needed moving from lying on your back to sitting on the side of a flat bed without using bedrails?: A Little Help needed moving to and from a bed to a chair (including a wheelchair)?: A Little Help needed standing up from a chair using your arms (e.g., wheelchair or bedside chair)?: A Little Help needed to walk in hospital room?: A Little Help needed climbing 3-5 steps with a railing? : A Lot 6 Click Score: 17    End of Session Equipment Utilized During Treatment: Gait belt Activity Tolerance: Patient tolerated treatment well;Patient limited by pain Patient left: in chair;with call bell/phone within reach;with chair alarm set;with nursing/sitter in room Nurse Communication: Mobility status PT Visit Diagnosis: Other abnormalities of gait and mobility (R26.89);Muscle weakness (generalized) (M62.81);History of falling (Z91.81);Pain Pain - Right/Left: Left Pain - part of body: Hip     Time: 3299-2426 PT Time Calculation (min) (ACUTE ONLY): 23 min  Charges:  $Therapeutic Exercise: 23-37 mins                     Vikki Gains M. Fairly IV, PT, DPT Physical Therapist- [facility]  08/25/2021, 1:30 PM

## 2021-08-25 NOTE — TOC Progression Note (Signed)
Transition of Care [facility]) - Progression Note    Patient Details  Name: Vonnetta Akey MRN: [id number] Date of Birth: Nov 04, 1929  [facility] CM/SW Contact  Eileen Stanford, LCSW Phone Number: 08/25/2021, 12:02 PM  Clinical Narrative: Josem Kaufmann obtained, pt can dc to [facility] today.      Expected Discharge Plan: [place] Barriers to Discharge: Continued Medical Work up  Expected Discharge Plan and Services Expected Discharge Plan: [place] In-house Referral: Clinical Social Work   Post Acute Care Choice: [place] Living arrangements for the past 2 months: Single Family Home                                       Social Determinants of Health (SDOH) Interventions    Readmission Risk Interventions     No data to display

## 2021-08-25 NOTE — Discharge Summary (Signed)
Physician Discharge Summary   Patient: Natalie [name] MRN: [id number] DOB: [birth year]  Admit date:     08/22/2021  Discharge date: 08/25/21  Discharge Physician: Annita Brod   PCP: [name], FNP   Recommendations at discharge:    New medication: Ultram 25 mg po q 8 hrs prn for pain New medication: lidoderm 5% on for 12 hours/off for 12 hours, stop after 30 days  Discharge Diagnoses: Active Problems:   Closed fracture of multiple pubic rami, left, initial encounter (HCC)   ABLA (acute blood loss anemia)   Pelvic hematoma, traumatic   COPD (chronic obstructive pulmonary disease) (HCC)   Senile dementia without behavioral disturbance (HCC)   Chronic diastolic CHF (congestive heart failure) (HCC)   Essential hypertension   Accidental fall   History of TIA (transient ischemic attack)   Personal history of malignant neoplasm of breast  Resolved Problems:   * No resolved hospital problems. *  Hospital Course: 88 year old female with past medical history of dementia, hypertension, COPD and previous breast cancer presented to the emergency room the night of 6/12 after mechanical fall landing on her left side and unable to stand.  Work-up in the emergency room revealed left superior and inferior pubic rami fractures along with a perivesical hematoma.  CT angiogram and follow-up noted no active bleeding.  Patient was admitted to the hospitalist service.  Orthopedic surgery was consulted who fractures were minimally displaced and would not require surgical intervention.  From an orthopedic standpoint, patient felt to be weightbearing as tolerated and physical therapy was recommended with follow-up in the office in 1 month for repeat x-rays.  Patient was brought in for pain control and PT and OT evaluation who recommended skilled nursing facility.  Patient's perivesical hematoma felt to be stable with actual increase in her hemoglobin on 6/14.  Accepts for SNF on  6/15.  Assessment and Plan: Closed fracture of multiple pubic rami, left, initial encounter ([place]) Secondary to accidental fall.  Fortunately, not felt to be requiring surgical intervention.  Seen by PT & OT who recommended skilled nursing.  ABLA (acute blood loss anemia) Pelvic hematoma CTA pelvis showed no active bleeding.  Patient's baseline hemoglobin around 12.5 and had dropped to 10.6 on admission.  No further evidence of bleeding.  SCDs for DVT prophylaxis.  COPD (chronic obstructive pulmonary disease) (HCC) Not acutely exacerbated. Continue Spiriva.  As needed DuoNebs  Chronic diastolic CHF (congestive heart failure) ([place]) Initially appeared euvolemic to dry given blood loss anemia.  Over the last 24 hours, patient has noted some mild hypoxia so 2 L of oxygen were added.  BNP minimally elevated at 11 and she received 1 dose of Lasix before discharge.  Senile dementia without behavioral disturbance (HCC) Continue Namenda and trazodone  Essential hypertension BP elevated.  Continue home amlodipine, lisinopril and metoprolol.  Likely secondary to pain.  Accidental fall PT and OT  History of TIA (transient ischemic attack) Will hold aspirin until sure bleeding is controlled.  Resumed on discharge. Continue statin        Pain control - [facility] Controlled Substance Reporting System database was reviewed. and patient was instructed, not to drive, operate heavy machinery, perform activities at heights, swimming or participation in water activities or provide baby-sitting services while on Pain, Sleep and Anxiety Medications; until their outpatient Physician has advised to do so again. Also recommended to not to take more than prescribed Pain, Sleep and Anxiety Medications.  Consultants: Orthopedic surgery   Procedures:  None Disposition: Skilled nursing facility Diet recommendation:  Discharge Diet Orders (From admission, onward)     Start     Ordered   08/25/21  0000  Diet - low sodium heart healthy        08/25/21 1323           Cardiac diet DISCHARGE MEDICATION: Allergies as of 08/25/2021       Reactions   Influenza Vaccines Shortness Of Breath        Medication List     TAKE these medications    amLODipine 10 MG tablet Commonly known as: NORVASC TAKE 1 TABLET BY MOUTH EVERY DAY   aspirin EC 81 MG tablet Take 81 mg by mouth daily. In preparation for surgery 12/18/ After stopping plavix   atorvastatin 40 MG tablet Commonly known as: LIPITOR TAKE 1 TABLET BY MOUTH EVERY DAY   Calcium Citrate-Vitamin D 250-200 MG-UNIT Tabs Take 250 mg by mouth every morning.   cholecalciferol 25 MCG (1000 UNIT) tablet Commonly known as: VITAMIN D3 Take 1,000 Units by mouth daily.   cyanocobalamin 1000 MCG/ML injection Commonly known as: (VITAMIN B-12) Inject 1,000 mcg into the muscle every 30 (thirty) days.   fluticasone 50 MCG/ACT nasal spray Commonly known as: FLONASE Place 2 sprays into both nostrils daily as needed for allergies.   ipratropium-albuterol 0.5-2.5 (3) MG/3ML Soln Commonly known as: DUONEB Take 3 mLs by nebulization 4 (four) times daily as needed.   lidocaine 5 % Commonly known as: LIDODERM Place 1 patch onto the skin daily. Remove & Discard patch within 12 hours or as directed by MD.  Stop after 30 days   lisinopril 5 MG tablet Commonly known as: ZESTRIL TAKE 1 TABLET BY MOUTH EVERY DAY   memantine 5 MG tablet Commonly known as: NAMENDA Take 1 tablet (5 mg at night) for 2 weeks, then increase to 1 tablet (5 mg) twice a day   metoprolol tartrate 50 MG tablet Commonly known as: LOPRESSOR TAKE 1/2 TABLET BY MOUTH TWICE A DAY   Tiotropium Bromide Monohydrate 2.5 MCG/ACT Aers Inhale 2 puffs into the lungs daily.   traMADol 50 MG tablet Commonly known as: Ultram Take 0.5 tablets (25 mg total) by mouth every 8 (eight) hours as needed.   traZODone 50 MG tablet Commonly known as: DESYREL Take 0.5 tablets  (25 mg total) by mouth at bedtime as needed.        Discharge Exam: Filed Weights   08/22/21 1536  Weight: 56.7 kg   General: no acute distress Cardiovascular: regular rate and rhythm Lungs: clear to auscultation bilaterally  Condition at discharge: fair  The results of significant diagnostics from this hospitalization (including imaging, microbiology, ancillary and laboratory) are listed below for reference.   Imaging Studies: CT Angio Abd/Pel W and/or Wo Contrast  Result Date: 08/23/2021 CLINICAL DATA:  Pelvic fracture.  Rule out arterial bleed. EXAM: CTA ABDOMEN AND PELVIS WITHOUT AND WITH CONTRAST TECHNIQUE: Multidetector CT imaging of the abdomen and pelvis was performed using the standard protocol during bolus administration of intravenous contrast. Multiplanar reconstructed images and MIPs were obtained and reviewed to evaluate the vascular anatomy. RADIATION DOSE REDUCTION: This exam was performed according to the departmental dose-optimization program which includes automated exposure control, adjustment of the mA and/or kV according to patient size and/or use of iterative reconstruction technique. CONTRAST:  36m OMNIPAQUE IOHEXOL 350 MG/ML SOLN COMPARISON:  Pelvic CT dated 08/22/2021. FINDINGS: VASCULAR Aorta: Advanced atherosclerotic calcification of the aorta. No aneurysmal dilatation or dissection.  No periaortic fluid collection. Celiac: The celiac trunk and its major branches appear patent. SMA: Atherosclerotic calcification of the SMA.  The SMA is patent. Renals: Atherosclerotic calcification of the renal arteries. Focal high-grade narrowing of the right renal artery. The renal arteries are patent. IMA: The IMA is patent. Inflow: Advanced atherosclerotic calcification of the iliac arteries. There is chronic thrombus and scarring of the proximal left common iliac artery with reconstitution of flow. The external iliac arteries are patent bilaterally. Proximal Outflow:  Atherosclerotic calcification. The visualized proximal outflow are patent. Veins: The IVC is unremarkable.  No portal venous gas. Review of the MIP images confirms the above findings. NON-VASCULAR Lower chest: Bibasilar linear atelectasis/scarring. Partially visualized traced pericardial effusion anterior to the heart. No intra-abdominal free air or free fluid. Hepatobiliary: The liver is unremarkable. The gallbladder is unremarkable. Pancreas: There is a 1.4 cm hypodense lesion in the head of the pancreas, not characterized on this CT but may represent a side branch IPMN. This can be better evaluated with pancreatic protocol MRI on a nonemergent/outpatient basis. No acute inflammatory changes. Spleen: Normal in size without focal abnormality. Adrenals/Urinary Tract: Mild bilateral adrenal thickening/hyperplasia. There is a 2.5 x 2.3 cm exophytic lesion from the superior pole of the left kidney which is indeterminate and not characterized on this CT. Further initial characterization with ultrasound on a nonemergent/outpatient basis recommended. MRI may provide additional evaluation depending on ultrasound findings. There is no hydronephrosis on either side. The visualized ureters appear unremarkable. Mild thickened and trabeculated appearance of the bladder wall. Stomach/Bowel: Small hiatal hernia. There is moderate stool throughout the colon. No bowel obstruction or active inflammation. The appendix is not visualized with certainty. No inflammatory changes identified in the right lower quadrant. Lymphatic: No adenopathy. Reproductive: The uterus is anteverted. Small calcified fibroid. No obvious adnexal masses. Other: Left pelvic hematoma similar to prior CT with associated mild mass effect on the bladder. No extravasation of contrast or evidence of active bleed. Musculoskeletal: Osteopenia with degenerative changes of the spine. Total right hip arthroplasty with associated streak artifact limiting evaluation of  the pelvic structure. Acute left pubic bone fractures as seen on the earlier CT. Old-appearing compression fracture of superior endplate of L1. IMPRESSION: 1. Left pelvic hematoma similar to prior CT with associated mild mass effect on the bladder. No extravasation of contrast or evidence of active bleed. 2. Advanced atherosclerotic disease of the abdominal aorta and its branches as described. There is chronic thrombus and scarring of the proximal left common iliac artery with reconstitution of flow. 3. Indeterminate pancreatic and left renal lesions. Further characterization with MRI without and with contrast on a nonemergent/outpatient basis recommended. Electronically Signed   By: Anner Crete M.D.   On: 08/23/2021 00:03   CT HEAD WO CONTRAST (5MM)  Result Date: 08/22/2021 CLINICAL DATA:  Fall EXAM: CT HEAD WITHOUT CONTRAST CT CERVICAL SPINE WITHOUT CONTRAST TECHNIQUE: Multidetector CT imaging of the head and cervical spine was performed following the standard protocol without intravenous contrast. Multiplanar CT image reconstructions of the cervical spine were also generated. RADIATION DOSE REDUCTION: This exam was performed according to the departmental dose-optimization program which includes automated exposure control, adjustment of the mA and/or kV according to patient size and/or use of iterative reconstruction technique. COMPARISON:  Brain MRI 06/15/2021 FINDINGS: CT HEAD FINDINGS Brain: No acute territorial infarction, hemorrhage or intracranial mass. Moderate atrophy. Extensive white matter hypodensity consistent with chronic small vessel ischemic change. Prominent ventricles, but felt secondary to atrophy Vascular: No hyperdense  vessels.  Carotid vascular calcification. Skull: Normal. Negative for fracture or focal lesion. Sinuses/Orbits: No acute finding. Old appearing right nasal bone fracture. Other: None CT CERVICAL SPINE FINDINGS Alignment: Mild reversal of cervical lordosis. Trace  anterolisthesis C2 on C3. Trace retrolisthesis C4 on C5 and C5 on C6. Facet alignment within normal limits Skull base and vertebrae: No acute fracture. No primary bone lesion or focal pathologic process. Soft tissues and spinal canal: No prevertebral fluid or swelling. No visible canal hematoma. Disc levels: Multilevel degenerative change. Advanced disc space narrowing C3 through C7. Facet degenerative changes at multiple levels with foraminal stenosis. Advanced C1-C2 degenerative change. Chronic appearing calcification superior and anterior to the tip of the dens. Upper chest: Emphysema. Scarring and calcification at the right apex Other: None IMPRESSION: 1. No CT evidence for acute intracranial abnormality. Atrophy and advanced chronic small vessel ischemic changes of the white matter 2. Mild reversal of cervical lordosis with multilevel degenerative changes. No acute fracture 3. Emphysema Electronically Signed   By: Donavan Foil M.D.   On: 08/22/2021 21:04   CT Cervical Spine Wo Contrast  Result Date: 08/22/2021 CLINICAL DATA:  Fall EXAM: CT HEAD WITHOUT CONTRAST CT CERVICAL SPINE WITHOUT CONTRAST TECHNIQUE: Multidetector CT imaging of the head and cervical spine was performed following the standard protocol without intravenous contrast. Multiplanar CT image reconstructions of the cervical spine were also generated. RADIATION DOSE REDUCTION: This exam was performed according to the departmental dose-optimization program which includes automated exposure control, adjustment of the mA and/or kV according to patient size and/or use of iterative reconstruction technique. COMPARISON:  Brain MRI 06/15/2021 FINDINGS: CT HEAD FINDINGS Brain: No acute territorial infarction, hemorrhage or intracranial mass. Moderate atrophy. Extensive white matter hypodensity consistent with chronic small vessel ischemic change. Prominent ventricles, but felt secondary to atrophy Vascular: No hyperdense vessels.  Carotid vascular  calcification. Skull: Normal. Negative for fracture or focal lesion. Sinuses/Orbits: No acute finding. Old appearing right nasal bone fracture. Other: None CT CERVICAL SPINE FINDINGS Alignment: Mild reversal of cervical lordosis. Trace anterolisthesis C2 on C3. Trace retrolisthesis C4 on C5 and C5 on C6. Facet alignment within normal limits Skull base and vertebrae: No acute fracture. No primary bone lesion or focal pathologic process. Soft tissues and spinal canal: No prevertebral fluid or swelling. No visible canal hematoma. Disc levels: Multilevel degenerative change. Advanced disc space narrowing C3 through C7. Facet degenerative changes at multiple levels with foraminal stenosis. Advanced C1-C2 degenerative change. Chronic appearing calcification superior and anterior to the tip of the dens. Upper chest: Emphysema. Scarring and calcification at the right apex Other: None IMPRESSION: 1. No CT evidence for acute intracranial abnormality. Atrophy and advanced chronic small vessel ischemic changes of the white matter 2. Mild reversal of cervical lordosis with multilevel degenerative changes. No acute fracture 3. Emphysema Electronically Signed   By: Donavan Foil M.D.   On: 08/22/2021 21:04   CT PELVIS WO CONTRAST  Result Date: 08/22/2021 CLINICAL DATA:  Left hip pain EXAM: CT PELVIS WITHOUT CONTRAST TECHNIQUE: Multidetector CT imaging of the pelvis was performed following the standard protocol without intravenous contrast. RADIATION DOSE REDUCTION: This exam was performed according to the departmental dose-optimization program which includes automated exposure control, adjustment of the mA and/or kV according to patient size and/or use of iterative reconstruction technique. COMPARISON:  None Available. FINDINGS: Bones/Joint/Cartilage Generalized osteopenia. No left hip fracture or dislocation. Mildly comminuted fracture of the left superior and inferior pubic rami without significant displacement. Large  perivesicular hematoma adjacent  to the left anterolateral bladder extending cephalad. Right total hip arthroplasty without hardware failure or complication. Severe degenerative disease with disc height loss at L5-S1. Degenerative disease with disc height loss at L4-5. Normal alignment. No joint effusion. Ligaments Ligaments are suboptimally evaluated by CT. Muscles and Tendons Muscles are normal. No muscle atrophy. No intramuscular fluid collection or hematoma. Soft tissue No other fluid collection or hematoma. No soft tissue mass. Abdominal aortic atherosclerosis. IMPRESSION: 1. Mildly comminuted fracture of the left superior and inferior pubic rami without significant displacement. Mildly comminuted fracture of the left superior and inferior pubic rami without significant displacement. Large perivesicular hematoma adjacent to the left anterolateral bladder extending cephalad. Electronically Signed   By: Kathreen Devoid M.D.   On: 08/22/2021 20:59   DG Hip Unilat With Pelvis 2-3 Views Left  Result Date: 08/22/2021 CLINICAL DATA:  Tripped and fell EXAM: DG HIP (WITH OR WITHOUT PELVIS) 2-3V LEFT COMPARISON:  None Available. FINDINGS: SI joints are non widened. Pubic symphysis is intact. There is a right hip replacement with normal alignment. Probable acute nondisplaced fracture involving the left superior and inferior pubic rami. IMPRESSION: Findings suspicious for nondisplaced fractures involving the left superior and inferior pubic rami Electronically Signed   By: Donavan Foil M.D.   On: 08/22/2021 16:12   VAS US CAROTID  Result Date: 08/04/2021 Carotid Arterial Duplex Study Patient Name:  Eloisa ANN Vanaken  Date of Exam:   08/03/2021 Medical Rec #: [id number]          Accession #:    [id number] Date of Birth: Dec 21, 1929          Patient Gender: F Patient Age:   66 years Exam Location:  [facility] Procedure:      VAS US CAROTID Referring Phys: [name]  --------------------------------------------------------------------------------  Indications: Left CEA 02/2019. Performing Technologist: Concha Norway RVT  Examination Guidelines: A complete evaluation includes B-mode imaging, spectral Doppler, color Doppler, and power Doppler as needed of all accessible portions of each vessel. Bilateral testing is considered an integral part of a complete examination. Limited examinations for reoccurring indications may be performed as noted.  Right Carotid Findings: +----------+--------+--------+--------+----------------------+--------+           PSV cm/sEDV cm/sStenosisPlaque Description    Comments +----------+--------+--------+--------+----------------------+--------+ CCA Prox  55      5                                              +----------+--------+--------+--------+----------------------+--------+ CCA Mid   62      11                                             +----------+--------+--------+--------+----------------------+--------+ CCA Distal53      8                                              +----------+--------+--------+--------+----------------------+--------+ ICA Prox  69      15      1-39%   calcific and irregular         +----------+--------+--------+--------+----------------------+--------+ ICA Mid   56      11                                             +----------+--------+--------+--------+----------------------+--------+  ICA Distal44      11                                             +----------+--------+--------+--------+----------------------+--------+ ECA       64      3                                              +----------+--------+--------+--------+----------------------+--------+ +----------+--------+-------+---------+-------------------+           PSV cm/sEDV cmsDescribe Arm Pressure (mmHG) +----------+--------+-------+---------+-------------------+ [id number]             Turbulent                    +----------+--------+-------+---------+-------------------+ +---------+--------+--+--------+-+---------+ VertebralPSV cm/s12EDV cm/s2Antegrade +---------+--------+--+--------+-+---------+  Left Carotid Findings: +----------+--------+--------+--------+------------------+--------+           PSV cm/sEDV cm/sStenosisPlaque DescriptionComments +----------+--------+--------+--------+------------------+--------+ CCA Prox  51      10                                         +----------+--------+--------+--------+------------------+--------+ CCA Mid   72      10                                         +----------+--------+--------+--------+------------------+--------+ CCA Distal78      9                                          +----------+--------+--------+--------+------------------+--------+ ICA Prox  106     18                                         +----------+--------+--------+--------+------------------+--------+ ICA Mid   70      9                                          +----------+--------+--------+--------+------------------+--------+ ICA Distal53      12                                         +----------+--------+--------+--------+------------------+--------+ ECA       97      7                                          +----------+--------+--------+--------+------------------+--------+ +----------+--------+--------+----------------+-------------------+           PSV cm/sEDV cm/sDescribe        Arm Pressure (mmHG) +----------+--------+--------+----------------+-------------------+ [id number]              Multiphasic, WNL                    +----------+--------+--------+----------------+-------------------+ +---------+--------+--+--------+-+---------+  VertebralPSV cm/s30EDV cm/s7Antegrade +---------+--------+--+--------+-+---------+   Summary: Right Carotid: Non-hemodynamically significant plaque  <50% noted in the CCA. The                ECA appears <50% stenosed. Left Carotid: Velocities in the left ICA are consistent with a 1-39% stenosis.               The ECA appears <50% stenosed. Vertebrals:  Bilateral vertebral arteries demonstrate antegrade flow. Subclavians: Right subclavian artery flow was disturbed. Normal flow              hemodynamics were seen in the left subclavian artery. *See table(s) above for measurements and observations.  Electronically signed by [name] MD on 08/04/2021 at 7:16:41 PM.    Final     Microbiology: Results for orders placed or performed during the hospital encounter of 11/16/20  Urine Culture     Status: Abnormal   Collection Time: 11/16/20 11:02 PM   Specimen: Urine, Clean Catch  Result Value Ref Range Status   Specimen Description   Final    URINE, CLEAN CATCH Performed at [facility], [address], [place], [place] [address]    Special Requests   Final    NONE Performed at [facility], [place] [place],  [address]    Culture MULTIPLE SPECIES PRESENT, SUGGEST RECOLLECTION (A)  Final   Report Status 11/18/2020 FINAL  Final    Labs: CBC: Recent Labs  Lab 08/22/21 2203 08/23/21 1721 08/24/21 0329  WBC 11.2*  --  11.0*  NEUTROABS 9.6*  --   --   HGB 10.6* 10.3* 11.0*  HCT 31.8* 30.3* 33.3*  MCV 93.0  --  92.5  PLT 160  --  559*   Basic Metabolic Panel: Recent Labs  Lab 08/22/21 2203 08/24/21 0329  NA 137 136  K 4.1 4.2  CL 107 105  CO2 25 24  GLUCOSE 134* 123*  BUN 24* 21  CREATININE 0.93 0.92  CALCIUM 9.8 9.2  MG  --  2.0  PHOS  --  3.6   Liver Function Tests: Recent Labs  Lab 08/22/21 2203  AST 23  ALT 19  ALKPHOS 55  BILITOT 1.0  PROT 7.2  ALBUMIN 4.1   CBG: No results for input(s): "GLUCAP" in the last 168 hours.  Discharge time spent: less than 30 minutes.  Signed: Annita Brod, MD [facility] 08/25/2021

## 2021-08-25 NOTE — Progress Notes (Signed)
Occupational Therapy Treatment Patient Details Name: Natalie [name] MRN: [id number] DOB: August 29, 1929 Today's Date: 08/25/2021   History of present illness Pt is a 88 y/o F who presents with L hip fx. PMH significant for Dementia hypertension, COPD, prior TIA, history of breast cancer.   OT comments  Pt seen for OT treatment on this date. Upon arrival to room pt awake and alert, seated in recliner. Pt agreeable to tx focusing on lower body dressing, ADL t/f, and grooming tasks. Pt reported pain increases with movement. Pt required SUPERVISION for threading mesh underpants while seated and CGA + RW to pull underpants into place - needs extra time. CGA + RW with min vcs for RW management for ADL t/f. CGA + RW for brushing teeth and brushing hair at sink in standing ~5 minutes with intermittent single UE support on sink counter. Pt left in recliner, with all needs met and family present. Pt making good progress toward goals. Pt continues to benefit from skilled OT services to maximize return to PLOF and minimize risk of future falls, injury. Will continue to follow POC. Discharge recommendation remains appropriate.     Recommendations for follow up therapy are one component of a multi-disciplinary discharge planning process, led by the attending physician.  Recommendations may be updated based on patient status, additional functional criteria and insurance authorization.    Follow Up Recommendations  Skilled nursing-short term rehab (<3 hours/day)    Assistance Recommended at Discharge Intermittent Supervision/Assistance  Patient can return home with the following  A little help with walking and/or transfers;A little help with bathing/dressing/bathroom;Assistance with cooking/housework;Assist for transportation   Equipment Recommendations  BSC/3in1    Recommendations for Other Services      Precautions / Restrictions Precautions Precautions: Fall Restrictions Weight Bearing Restrictions:  Yes LLE Weight Bearing: Weight bearing as tolerated       Mobility Bed Mobility               General bed mobility comments: NT; pt in recliner at start and end of session    Transfers Overall transfer level: Needs assistance Equipment used: Rolling walker (2 wheels) Transfers: Sit to/from Stand Sit to Stand: Min guard                 Balance Overall balance assessment: Needs assistance Sitting-balance support: No upper extremity supported, Feet supported Sitting balance-Leahy Scale: Good     Standing balance support: Bilateral upper extremity supported, During functional activity Standing balance-Leahy Scale: Fair                             ADL either performed or assessed with clinical judgement   ADL Overall ADL's : Needs assistance/impaired                                       General ADL Comments: SUPERVISION for threading mesh underpants while seated and CGA + RW to pull underpants into place - needs extra time. CGA + RW with min vcs for RW management for ADL t/f. CGA + RW for brushing teeth and brushing hair at sink in standing ~5 minutes with intermittent single UE support on sink counter.      Cognition Arousal/Alertness: Awake/alert Behavior During Therapy: WFL for tasks assessed/performed Overall Cognitive Status: Within Functional Limits for tasks assessed  Pertinent Vitals/ Pain       Pain Assessment Pain Assessment: Faces Faces Pain Scale: Hurts a little bit Pain Location: L hip/pelvis Pain Descriptors / Indicators: Discomfort, Grimacing, Guarding Pain Intervention(s): Limited activity within patient's tolerance, Monitored during session   Frequency  Min 2X/week        Progress Toward Goals  OT Goals(current goals can now be found in the care plan section)  Progress towards OT goals: Progressing toward goals  Acute Rehab OT  Goals Patient Stated Goal: to improve pain OT Goal Formulation: With patient Time For Goal Achievement: 09/07/21 Potential to Achieve Goals: Good ADL Goals Pt Will Perform Grooming: standing;with modified independence Pt Will Perform Lower Body Dressing: with modified independence;sit to/from stand;with adaptive equipment Pt Will Transfer to Toilet: with modified independence;ambulating;regular height toilet  Plan Discharge plan remains appropriate;Frequency remains appropriate       AM-PAC OT "6 Clicks" Daily Activity     Outcome Measure   Help from another person eating meals?: None Help from another person taking care of personal grooming?: A Little Help from another person toileting, which includes using toliet, bedpan, or urinal?: A Little Help from another person bathing (including washing, rinsing, drying)?: A Little Help from another person to put on and taking off regular upper body clothing?: A Little Help from another person to put on and taking off regular lower body clothing?: A Little 6 Click Score: 19    End of Session Equipment Utilized During Treatment: Gait belt;Rolling walker (2 wheels)  OT Visit Diagnosis: Unsteadiness on feet (R26.81)   Activity Tolerance Patient tolerated treatment well   Patient Left in chair;with call bell/phone within reach;with chair alarm set;with family/visitor present   Nurse Communication          Time: 6286-3817 OT Time Calculation (min): 23 min  Charges: OT General Charges $OT Visit: 1 Visit OT Treatments $Self Care/Home Management : 23-37 mins  [employer] OTDS  [employer] 08/25/2021, 1:53 PM

## 2021-08-25 NOTE — Plan of Care (Signed)

## 2021-08-26 ENCOUNTER — Telehealth: Payer: Self-pay

## 2021-08-26 DIAGNOSIS — I5042 Chronic combined systolic (congestive) and diastolic (congestive) heart failure: Secondary | ICD-10-CM | POA: Diagnosis not present

## 2021-08-26 DIAGNOSIS — D62 Acute posthemorrhagic anemia: Secondary | ICD-10-CM | POA: Diagnosis not present

## 2021-08-26 DIAGNOSIS — S32810D Multiple fractures of pelvis with stable disruption of pelvic ring, subsequent encounter for fracture with routine healing: Secondary | ICD-10-CM | POA: Diagnosis not present

## 2021-08-26 DIAGNOSIS — K661 Hemoperitoneum: Secondary | ICD-10-CM | POA: Diagnosis not present

## 2021-08-26 NOTE — Telephone Encounter (Signed)
Called Sybil. Mrs Rishel daughter to check on her since she was released from hospital. [name] stated that  she was released to [facility] as of 08/25/21 probably for about a week or two. Mrs Pala walked today for the first time and seems to be making it work. Sybil stated that it is going to be a process. She is enthusiastic about it. Sybil stated to me that she wants to come by and pick up her Mom's inhaler today. I told her to just let me know when she arrived and I would bring it up front to her.

## 2021-08-27 ENCOUNTER — Other Ambulatory Visit: Payer: Self-pay | Admitting: Physician Assistant

## 2021-08-29 DIAGNOSIS — D62 Acute posthemorrhagic anemia: Secondary | ICD-10-CM | POA: Diagnosis not present

## 2021-08-29 DIAGNOSIS — I5042 Chronic combined systolic (congestive) and diastolic (congestive) heart failure: Secondary | ICD-10-CM | POA: Diagnosis not present

## 2021-08-29 DIAGNOSIS — S32810D Multiple fractures of pelvis with stable disruption of pelvic ring, subsequent encounter for fracture with routine healing: Secondary | ICD-10-CM | POA: Diagnosis not present

## 2021-08-29 DIAGNOSIS — K661 Hemoperitoneum: Secondary | ICD-10-CM | POA: Diagnosis not present

## 2021-08-30 DIAGNOSIS — D62 Acute posthemorrhagic anemia: Secondary | ICD-10-CM | POA: Diagnosis not present

## 2021-08-30 DIAGNOSIS — K661 Hemoperitoneum: Secondary | ICD-10-CM | POA: Diagnosis not present

## 2021-08-30 DIAGNOSIS — I5042 Chronic combined systolic (congestive) and diastolic (congestive) heart failure: Secondary | ICD-10-CM | POA: Diagnosis not present

## 2021-08-30 DIAGNOSIS — S32810D Multiple fractures of pelvis with stable disruption of pelvic ring, subsequent encounter for fracture with routine healing: Secondary | ICD-10-CM | POA: Diagnosis not present

## 2021-09-02 DIAGNOSIS — K661 Hemoperitoneum: Secondary | ICD-10-CM | POA: Diagnosis not present

## 2021-09-02 DIAGNOSIS — S32810D Multiple fractures of pelvis with stable disruption of pelvic ring, subsequent encounter for fracture with routine healing: Secondary | ICD-10-CM | POA: Diagnosis not present

## 2021-09-02 DIAGNOSIS — D62 Acute posthemorrhagic anemia: Secondary | ICD-10-CM | POA: Diagnosis not present

## 2021-09-02 DIAGNOSIS — I5042 Chronic combined systolic (congestive) and diastolic (congestive) heart failure: Secondary | ICD-10-CM | POA: Diagnosis not present

## 2021-09-05 DIAGNOSIS — K661 Hemoperitoneum: Secondary | ICD-10-CM | POA: Diagnosis not present

## 2021-09-05 DIAGNOSIS — D62 Acute posthemorrhagic anemia: Secondary | ICD-10-CM | POA: Diagnosis not present

## 2021-09-05 DIAGNOSIS — I5042 Chronic combined systolic (congestive) and diastolic (congestive) heart failure: Secondary | ICD-10-CM | POA: Diagnosis not present

## 2021-09-05 DIAGNOSIS — S32810D Multiple fractures of pelvis with stable disruption of pelvic ring, subsequent encounter for fracture with routine healing: Secondary | ICD-10-CM | POA: Diagnosis not present

## 2021-09-09 ENCOUNTER — Telehealth: Payer: Self-pay | Admitting: Family

## 2021-09-09 NOTE — Telephone Encounter (Signed)
Call home care   Message confusing   What orders do they need?

## 2021-09-09 NOTE — Telephone Encounter (Signed)
Kelsey from [facility] called asking if provider will sign off on orders to follow pt

## 2021-09-09 NOTE — Telephone Encounter (Signed)
Please see previous message Get more info  Is this home health?  Yes, I will follow pt

## 2021-09-09 NOTE — Telephone Encounter (Signed)
Natalie [name], [phone] wanted to know if [name] will follow patient.

## 2021-09-11 ENCOUNTER — Other Ambulatory Visit: Payer: Self-pay | Admitting: Family

## 2021-09-11 DIAGNOSIS — Z0000 Encounter for general adult medical examination without abnormal findings: Secondary | ICD-10-CM

## 2021-09-11 DIAGNOSIS — F320 Major depressive disorder, single episode, mild: Secondary | ICD-10-CM

## 2021-09-12 NOTE — Telephone Encounter (Signed)
Spoke to [facility] in regards to Natalie [name] she stated that she would pass the info along to [facility] who was out of the office today.

## 2021-09-14 NOTE — Telephone Encounter (Signed)
Pt daughter called stating that she has not heard from the home health agency about them coming to see patient at home  Pt daughter number [phone]

## 2021-09-14 NOTE — Telephone Encounter (Signed)
Spoke to patient's daughter [name] in regards to her message. Gave her the information of the lady that called from home health by the name of Merleen Nicely so she could reach out to them.

## 2021-09-17 DIAGNOSIS — K661 Hemoperitoneum: Secondary | ICD-10-CM | POA: Diagnosis not present

## 2021-09-17 DIAGNOSIS — S3792XD Contusion of unspecified urinary and pelvic organ, subsequent encounter: Secondary | ICD-10-CM | POA: Diagnosis not present

## 2021-09-17 DIAGNOSIS — S32502D Unspecified fracture of left pubis, subsequent encounter for fracture with routine healing: Secondary | ICD-10-CM | POA: Diagnosis not present

## 2021-09-17 DIAGNOSIS — H9190 Unspecified hearing loss, unspecified ear: Secondary | ICD-10-CM | POA: Diagnosis not present

## 2021-09-17 DIAGNOSIS — I5042 Chronic combined systolic (congestive) and diastolic (congestive) heart failure: Secondary | ICD-10-CM | POA: Diagnosis not present

## 2021-09-17 DIAGNOSIS — F0150 Vascular dementia without behavioral disturbance: Secondary | ICD-10-CM | POA: Diagnosis not present

## 2021-09-17 DIAGNOSIS — E782 Mixed hyperlipidemia: Secondary | ICD-10-CM | POA: Diagnosis not present

## 2021-09-17 DIAGNOSIS — J449 Chronic obstructive pulmonary disease, unspecified: Secondary | ICD-10-CM | POA: Diagnosis not present

## 2021-09-17 DIAGNOSIS — D62 Acute posthemorrhagic anemia: Secondary | ICD-10-CM | POA: Diagnosis not present

## 2021-09-17 DIAGNOSIS — Z9181 History of falling: Secondary | ICD-10-CM | POA: Diagnosis not present

## 2021-09-17 DIAGNOSIS — M1990 Unspecified osteoarthritis, unspecified site: Secondary | ICD-10-CM | POA: Diagnosis not present

## 2021-09-17 DIAGNOSIS — K219 Gastro-esophageal reflux disease without esophagitis: Secondary | ICD-10-CM | POA: Diagnosis not present

## 2021-09-17 DIAGNOSIS — I110 Hypertensive heart disease with heart failure: Secondary | ICD-10-CM | POA: Diagnosis not present

## 2021-09-17 DIAGNOSIS — K5900 Constipation, unspecified: Secondary | ICD-10-CM | POA: Diagnosis not present

## 2021-09-17 DIAGNOSIS — Z7982 Long term (current) use of aspirin: Secondary | ICD-10-CM | POA: Diagnosis not present

## 2021-09-17 DIAGNOSIS — R32 Unspecified urinary incontinence: Secondary | ICD-10-CM | POA: Diagnosis not present

## 2021-09-21 ENCOUNTER — Ambulatory Visit (INDEPENDENT_AMBULATORY_CARE_PROVIDER_SITE_OTHER): Payer: Medicare Other | Admitting: Family

## 2021-09-21 DIAGNOSIS — S32591A Other specified fracture of right pubis, initial encounter for closed fracture: Secondary | ICD-10-CM | POA: Diagnosis not present

## 2021-09-21 DIAGNOSIS — M8580 Other specified disorders of bone density and structure, unspecified site: Secondary | ICD-10-CM | POA: Diagnosis not present

## 2021-09-21 DIAGNOSIS — K869 Disease of pancreas, unspecified: Secondary | ICD-10-CM

## 2021-09-21 DIAGNOSIS — N2889 Other specified disorders of kidney and ureter: Secondary | ICD-10-CM | POA: Diagnosis not present

## 2021-09-21 DIAGNOSIS — S32592A Other specified fracture of left pubis, initial encounter for closed fracture: Secondary | ICD-10-CM | POA: Diagnosis not present

## 2021-09-21 DIAGNOSIS — M7989 Other specified soft tissue disorders: Secondary | ICD-10-CM

## 2021-09-21 DIAGNOSIS — I700 Atherosclerosis of aorta: Secondary | ICD-10-CM | POA: Insufficient documentation

## 2021-09-21 DIAGNOSIS — D510 Vitamin B12 deficiency anemia due to intrinsic factor deficiency: Secondary | ICD-10-CM

## 2021-09-21 MED ORDER — TRAMADOL HCL 50 MG PO TABS: 50.0000 mg | ORAL_TABLET | Freq: Two times a day (BID) | ORAL | 1 refills | Status: DC | PRN

## 2021-09-21 NOTE — Assessment & Plan Note (Signed)
Discuss atherosclerosis seen on CT.  Advised patient to continue ASA '81mg'$ , lipitor '40mg'$ . Pending cardiology follow-up based on pericardial effusion again noted on CT, previously noted on echocardiogram, atherosclerosis, diastolic dysfunction as seen on echocardiogram.

## 2021-09-21 NOTE — Patient Instructions (Addendum)
As discussed, let's start by scheduling Tylenol Arthritis which is a '650mg'$  tablet .   You may take 1-2 tablets every 8 hours ( scheduled) with maximum of 6 tablets per day.   For example , you could take two tablets in the morning ( 8am) and then two tablets again at 4pm.   Maximum daily dose of acetaminophen 4 g per day from all sources.  If you are taking another medication which includes acetaminophen (Tylenol) which may be in cough and cold preparations or pain medication such as Percocet, you will need to factor that into your total daily dose to be safe.  Please let me know if any questions  I have refilled tramadol for you take twice daily if needed.  Please use sparingly however I want to control your pain.  If you feel excessive sedation on this medication, please stop using tramadol  As discussed, I think in the future a bone density will be very helpful to evaluate for osteoporosis  I have ordered MRI of your abdomen and ultrasound of your legs.  I have also placed a referral to cardiology Let us know if you dont hear back within a week in regards to an appointment being scheduled.

## 2021-09-21 NOTE — Progress Notes (Signed)
Subjective:    Patient ID: Natalie [name], female    DOB: Aug 21, 1929, 88 y.o.   MRN: [id number]  CC: Natalie [name] is a 88 y.o. female who presents today for follow up.   HPI: Accompanied by daughter  She is using walker at home. No recurrence of fall.   She was seen this morning by orthopedics with repeat pelvis xray ; daughter states they were informed  fractures improved. She will continue PT. She continues to have left posterior hip pain, left posterior thigh pain. No groin pain, numbness. Pain has improved  She is using lidocaine.She has been taking tramadol '50mg'$  very sparingly.   Over the past couple weeks she had bilateral leg swelling.  It is improved.  More noticeable in her right foot today.  Denies orthopnea, shortness of breath She is weighing herself daily Pernicious anemia-upcoming appointment with gastroenterology, Dr. Allen Norris in August  Admitted to the hospital 08/22/2021 and discharged 3 days later mechanical fall subsequent left superior and inferior pubic rami fractures along with perivesical hematoma.  Fracture is minimally displaced and would not require surgical intervention. SNF  History of TIA-she resumed on aspirin which was held during hospital course.  She is compliant with lipitor '40mg'$   She is compliant with b12 po  CT angio abdominal pelvis 08/22/2021 advanced atherosclerotic calcification of aorta.  Atherosclerotic calcification of SMA.  Chronic thrombus and scarring of the proximal left common iliac artery.  Trace pericardial effusion.  0.4 cm hypodense lesion head of the pancreas.  Mild bilateral adrenal thickening/hyperplasia.  Left kidney lesion.  CT pelvis 08/22/2021 shows generalized osteopenia left superior and inferior pubic rami  without significant displacement.  Large perivesicular hematoma.  Severe degenerative disease with disc height loss L5-S1.   07/15/2021 echocardiogram which showed left ventricular ejection fraction 65 to 70%.  Severe  left ventricular hypertrophy, grade 1 diastolic dysfunction, small pericardial effusion HISTORY:  Past Medical History:  Diagnosis Date   Allergies    hay fever   Arthritis    Breast cancer, left ([place]) 2015   Left total mastectomy   COPD (chronic obstructive pulmonary disease) ([place])    GERD (gastroesophageal reflux disease)    Hyperlipemia    Hypertension    Stroke ([place]) 2002   TIA   Past Surgical History:  Procedure Laterality Date   BREAST BIOPSY  2014   BREAST LUMPECTOMY  2014   left side   ENDARTERECTOMY Left 02/28/2019   Procedure: ENDARTERECTOMY CAROTID;  Surgeon: Katha Cabal, MD;  Location: [facility] ORS;  Service: Vascular;  Laterality: Left;   EVACUATION OF CERVICAL HEMATOMA Left 02/28/2019   Procedure: EVACUATION OF HEMATOMA;  Surgeon: Katha Cabal, MD;  Location: [facility] ORS;  Service: Vascular;  Laterality: Left;   EYE SURGERY Bilateral    cataract extractions   JOINT REPLACEMENT     MASTECTOMY Left May 9th 2017   [place]   TOTAL HIP ARTHROPLASTY Right    head of the femur replaced due to fracture   Family History  Problem Relation Age of Onset   Breast cancer Mother    Lung cancer Father     Allergies: Influenza vaccines Current Outpatient Medications on File Prior to Visit  Medication Sig Dispense Refill   amLODipine (NORVASC) 10 MG tablet TAKE 1 TABLET BY MOUTH EVERY DAY 90 tablet 1   aspirin EC 81 MG tablet Take 81 mg by mouth daily. In preparation for surgery 12/18/ After stopping plavix  atorvastatin (LIPITOR) 40 MG tablet TAKE 1 TABLET BY MOUTH EVERY DAY 90 tablet 3   Calcium Citrate-Vitamin D 250-200 MG-UNIT TABS Take 250 mg by mouth every morning.     cholecalciferol (VITAMIN D3) 25 MCG (1000 UT) tablet Take 1,000 Units by mouth daily.      cyanocobalamin (,VITAMIN B-12,) 1000 MCG/ML injection Inject 1,000 mcg into the muscle every 30 (thirty) days.     fluticasone (FLONASE) 50 MCG/ACT nasal spray Place 2  sprays into both nostrils daily as needed for allergies.     ipratropium-albuterol (DUONEB) 0.5-2.5 (3) MG/3ML SOLN Take 3 mLs by nebulization 4 (four) times daily as needed. 360 mL 3   lidocaine (LIDODERM) 5 % Place 1 patch onto the skin daily. Remove & Discard patch within 12 hours or as directed by MD.  Stop after 30 days 30 patch 0   lisinopril (ZESTRIL) 5 MG tablet TAKE 1 TABLET BY MOUTH EVERY DAY 90 tablet 0   memantine (NAMENDA) 5 MG tablet TAKE 1 TABLET BY MOUTH FOR 2 WEEKS, THEN INCREASE TO 1 TABLET BY MOUTH TWICE A DAY 180 tablet 4   metoprolol tartrate (LOPRESSOR) 50 MG tablet TAKE 1/2 TABLET BY MOUTH TWICE A DAY 90 tablet 0   Tiotropium Bromide Monohydrate 2.5 MCG/ACT AERS Inhale 2 puffs into the lungs daily. 4 g 3   traZODone (DESYREL) 50 MG tablet TAKE 1/2 TABLETS (25 MG) BY MOUTH AT BEDTIME. 45 tablet 3   Current Facility-Administered Medications on File Prior to Visit  Medication Dose Route Frequency Provider Last Rate Last Admin   ipratropium-albuterol (DUONEB) 0.5-2.5 (3) MG/3ML nebulizer solution 3 mL  3 mL Nebulization Once [name], FNP        Social History   Tobacco Use   Smoking status: Former   Smokeless tobacco: Never   Tobacco comments:    20 years ago  Vaping Use   Vaping Use: Never used  Substance Use Topics   Alcohol use: Yes    Comment: occassionally   Drug use: Never    Review of Systems  Constitutional:  Negative for chills and fever.  Respiratory:  Negative for cough and shortness of breath.   Cardiovascular:  Positive for leg swelling. Negative for chest pain and palpitations.  Gastrointestinal:  Negative for nausea and vomiting.  Musculoskeletal:  Positive for arthralgias (hip pain).      Objective:    There were no vitals taken for this visit. BP Readings from Last 3 Encounters:  08/25/21 (!) 126/48  08/05/21 (!) 159/63  08/03/21 (!) 116/55   Wt Readings from Last 3 Encounters:  08/22/21 125 lb (56.7 kg)  08/05/21 125 lb  (56.7 kg)  08/03/21 123 lb 3.2 oz (55.9 kg)    Physical Exam Vitals reviewed.  Constitutional:      Appearance: She is well-developed.  Eyes:     Conjunctiva/sclera: Conjunctivae normal.  Cardiovascular:     Rate and Rhythm: Normal rate and regular rhythm.     Pulses: Normal pulses.     Heart sounds: Normal heart sounds.     Comments: RLE +1 edema. No palpable cords or masses in either calf.  No erythema or increased warmth.  No discoloration or varicosities noted. LE warm   Pulmonary:     Effort: Pulmonary effort is normal.     Breath sounds: Normal breath sounds. No wheezing, rhonchi or rales.  Skin:    General: Skin is warm and dry.  Neurological:     Mental Status:  She is alert.  Psychiatric:        Speech: Speech normal.        Behavior: Behavior normal.        Thought Content: Thought content normal.        Assessment & Plan:   Problem List Items Addressed This Visit       Cardiovascular and Mediastinum   Aortic atherosclerosis ([place])    Discuss atherosclerosis seen on CT.  Advised patient to continue ASA '81mg'$ , lipitor '40mg'$ . Pending cardiology follow-up based on pericardial effusion again noted on CT, previously noted on echocardiogram, atherosclerosis, diastolic dysfunction as seen on echocardiogram.        Musculoskeletal and Integument   Closed fracture of multiple pubic rami, left, initial encounter ([place]) - Primary    Reviewed hospitalization with patient today.  Medications reconciled.  Discussed incidental findings on CT pelvis which warrant further evaluation of pancreatic lesion, adrenal hyperplasia, left renal lesion. MRI abdomen has been ordered.  Discussed ongoing pain when she is currently following orthopedics, Dr. Nicola Police.  She will continue physical therapy.  Advised that she may use tramadol 50 mg twice daily prn for moderate to severe pain.  Encouraged her to schedule Tylenol arthritis for pain control.  She will let me know how she is doing.  Refilled tramadol. Counseled on risk of addiction and sedation on tramadol. I looked up patient on [place] Controlled Substances Reporting System PMP AWARE and saw no activity that raised concern of inappropriate use.        Relevant Medications   traMADol (ULTRAM) 50 MG tablet   Osteopenia    Discussed osteopenia seen on CT angio abdominal pelvis.  Patient declines bone density at this time.  She will consider this in the future        Other   Leg swelling    Pending [name] , particularly after fracture, decreased mobility to rule out fracture.       Relevant Orders   US Venous Img Lower Bilateral (DVT)   Ambulatory referral to Cardiology   Pernicious anemia    She is compliant with p.o. B12 supplementation at home.  Upcoming appointment with Dr. Allen Norris, gastroenterology. will follow      Other Visit Diagnoses     Other specified disorders of kidney and ureter       Relevant Orders   MR Abdomen W Wo Contrast   Pancreatic lesion       Relevant Orders   MR Abdomen W Wo Contrast        I have changed Joaquim Lai A. Lezcano's traMADol. I am also having her maintain her Calcium Citrate-Vitamin D, cholecalciferol, fluticasone, aspirin EC, amLODipine, lisinopril, atorvastatin, metoprolol tartrate, Tiotropium Bromide Monohydrate, ipratropium-albuterol, cyanocobalamin, lidocaine, memantine, and traZODone. We will continue to administer ipratropium-albuterol.   Meds ordered this encounter  Medications   traMADol (ULTRAM) 50 MG tablet    Sig: Take 1 tablet (50 mg total) by mouth 2 (two) times daily as needed.    Dispense:  60 tablet    Refill:  1    Order Specific Question:   Supervising Provider    Answer:   Crecencio Mc [[id number]]    Return precautions given.   Risks, benefits, and alternatives of the medications and treatment plan prescribed today were discussed, and patient expressed understanding.   Education regarding symptom management and diagnosis given to patient on  AVS.  Continue to follow with [name], FNP for routine health maintenance.   Briscoe Burns  Wynes and I agreed with plan.   Mable Paris, FNP  I have spent 40 minutes with a patient including precharting, exam, reviewing medical records during hospitalization and explaining findings to patient and daughter, and discussion plan of care.

## 2021-09-21 NOTE — Assessment & Plan Note (Addendum)
Reviewed hospitalization with patient today.  Medications reconciled.  Discussed incidental findings on CT pelvis which warrant further evaluation of pancreatic lesion, adrenal hyperplasia, left renal lesion. MRI abdomen has been ordered.  Discussed ongoing pain when she is currently following orthopedics, Dr. Nicola Police.  She will continue physical therapy.  Advised that she may use tramadol 50 mg twice daily prn for moderate to severe pain.  Encouraged her to schedule Tylenol arthritis for pain control.  She will let me know how she is doing. Refilled tramadol. Counseled on risk of addiction and sedation on tramadol. I looked up patient on [place] Controlled Substances Reporting System PMP AWARE and saw no activity that raised concern of inappropriate use.

## 2021-09-21 NOTE — Assessment & Plan Note (Signed)
Discussed osteopenia seen on CT angio abdominal pelvis.  Patient declines bone density at this time.  She will consider this in the future

## 2021-09-21 NOTE — Assessment & Plan Note (Signed)
She is compliant with p.o. B12 supplementation at home.  Upcoming appointment with Dr. Allen Norris, gastroenterology. will follow

## 2021-09-21 NOTE — Assessment & Plan Note (Signed)
Pending [name] , particularly after fracture, decreased mobility to rule out fracture.

## 2021-09-22 ENCOUNTER — Ambulatory Visit
Admission: RE | Admit: 2021-09-22 | Discharge: 2021-09-22 | Disposition: A | Discharge status: Home / Self Care | Payer: Medicare Other | Source: Ambulatory Visit | Attending: Family | Admitting: Family

## 2021-09-22 DIAGNOSIS — M7121 Synovial cyst of popliteal space [Baker], right knee: Secondary | ICD-10-CM | POA: Diagnosis not present

## 2021-09-22 DIAGNOSIS — M7989 Other specified soft tissue disorders: Secondary | ICD-10-CM | POA: Diagnosis not present

## 2021-09-29 ENCOUNTER — Ambulatory Visit
Admission: RE | Admit: 2021-09-29 | Discharge: 2021-09-29 | Disposition: A | Discharge status: Home / Self Care | Payer: Medicare Other | Source: Ambulatory Visit | Attending: Family | Admitting: Family

## 2021-09-29 DIAGNOSIS — N281 Cyst of kidney, acquired: Secondary | ICD-10-CM | POA: Diagnosis not present

## 2021-09-29 DIAGNOSIS — K869 Disease of pancreas, unspecified: Secondary | ICD-10-CM | POA: Diagnosis not present

## 2021-09-29 DIAGNOSIS — N2889 Other specified disorders of kidney and ureter: Secondary | ICD-10-CM | POA: Diagnosis not present

## 2021-09-29 DIAGNOSIS — E278 Other specified disorders of adrenal gland: Secondary | ICD-10-CM | POA: Diagnosis not present

## 2021-09-29 DIAGNOSIS — K862 Cyst of pancreas: Secondary | ICD-10-CM | POA: Diagnosis not present

## 2021-09-29 MED ORDER — GADOBUTROL 1 MMOL/ML IV SOLN
5.0000 mL | Freq: Once | INTRAVENOUS | Status: AC | PRN
  Administered 2021-09-29: 5 mL via INTRAVENOUS

## 2021-10-04 DIAGNOSIS — Z9842 Cataract extraction status, left eye: Secondary | ICD-10-CM | POA: Diagnosis not present

## 2021-10-04 DIAGNOSIS — Z9841 Cataract extraction status, right eye: Secondary | ICD-10-CM | POA: Diagnosis not present

## 2021-10-04 DIAGNOSIS — H5203 Hypermetropia, bilateral: Secondary | ICD-10-CM | POA: Diagnosis not present

## 2021-10-04 DIAGNOSIS — H353132 Nonexudative age-related macular degeneration, bilateral, intermediate dry stage: Secondary | ICD-10-CM | POA: Diagnosis not present

## 2021-10-07 ENCOUNTER — Other Ambulatory Visit: Payer: Self-pay | Admitting: Family

## 2021-10-07 DIAGNOSIS — I10 Essential (primary) hypertension: Secondary | ICD-10-CM

## 2021-10-11 ENCOUNTER — Ambulatory Visit: Payer: Medicare Other | Admitting: Family

## 2021-10-17 DIAGNOSIS — S3792XD Contusion of unspecified urinary and pelvic organ, subsequent encounter: Secondary | ICD-10-CM | POA: Diagnosis not present

## 2021-10-17 DIAGNOSIS — Z7982 Long term (current) use of aspirin: Secondary | ICD-10-CM | POA: Diagnosis not present

## 2021-10-17 DIAGNOSIS — I5042 Chronic combined systolic (congestive) and diastolic (congestive) heart failure: Secondary | ICD-10-CM | POA: Diagnosis not present

## 2021-10-17 DIAGNOSIS — K219 Gastro-esophageal reflux disease without esophagitis: Secondary | ICD-10-CM | POA: Diagnosis not present

## 2021-10-17 DIAGNOSIS — Z9181 History of falling: Secondary | ICD-10-CM | POA: Diagnosis not present

## 2021-10-17 DIAGNOSIS — H9190 Unspecified hearing loss, unspecified ear: Secondary | ICD-10-CM | POA: Diagnosis not present

## 2021-10-17 DIAGNOSIS — S32502D Unspecified fracture of left pubis, subsequent encounter for fracture with routine healing: Secondary | ICD-10-CM | POA: Diagnosis not present

## 2021-10-17 DIAGNOSIS — R32 Unspecified urinary incontinence: Secondary | ICD-10-CM | POA: Diagnosis not present

## 2021-10-17 DIAGNOSIS — E782 Mixed hyperlipidemia: Secondary | ICD-10-CM | POA: Diagnosis not present

## 2021-10-17 DIAGNOSIS — K5900 Constipation, unspecified: Secondary | ICD-10-CM | POA: Diagnosis not present

## 2021-10-17 DIAGNOSIS — F0150 Vascular dementia without behavioral disturbance: Secondary | ICD-10-CM | POA: Diagnosis not present

## 2021-10-17 DIAGNOSIS — J449 Chronic obstructive pulmonary disease, unspecified: Secondary | ICD-10-CM | POA: Diagnosis not present

## 2021-10-17 DIAGNOSIS — K661 Hemoperitoneum: Secondary | ICD-10-CM | POA: Diagnosis not present

## 2021-10-17 DIAGNOSIS — I110 Hypertensive heart disease with heart failure: Secondary | ICD-10-CM | POA: Diagnosis not present

## 2021-10-17 DIAGNOSIS — D62 Acute posthemorrhagic anemia: Secondary | ICD-10-CM | POA: Diagnosis not present

## 2021-10-17 DIAGNOSIS — M1990 Unspecified osteoarthritis, unspecified site: Secondary | ICD-10-CM | POA: Diagnosis not present

## 2021-10-19 ENCOUNTER — Encounter: Payer: Self-pay | Admitting: Family

## 2021-10-19 ENCOUNTER — Telehealth: Payer: Self-pay | Admitting: Family

## 2021-10-19 ENCOUNTER — Ambulatory Visit (INDEPENDENT_AMBULATORY_CARE_PROVIDER_SITE_OTHER): Payer: Medicare Other | Admitting: Family

## 2021-10-19 VITALS — BP 140/55 | HR 67 | Temp 97.8°F | Ht 65.0 in | Wt 120.6 lb

## 2021-10-19 DIAGNOSIS — F0390 Unspecified dementia without behavioral disturbance: Secondary | ICD-10-CM | POA: Diagnosis not present

## 2021-10-19 DIAGNOSIS — E278 Other specified disorders of adrenal gland: Secondary | ICD-10-CM

## 2021-10-19 DIAGNOSIS — I10 Essential (primary) hypertension: Secondary | ICD-10-CM

## 2021-10-19 DIAGNOSIS — K862 Cyst of pancreas: Secondary | ICD-10-CM

## 2021-10-19 DIAGNOSIS — Z0000 Encounter for general adult medical examination without abnormal findings: Secondary | ICD-10-CM

## 2021-10-19 DIAGNOSIS — S32592A Other specified fracture of left pubis, initial encounter for closed fracture: Secondary | ICD-10-CM

## 2021-10-19 DIAGNOSIS — F320 Major depressive disorder, single episode, mild: Secondary | ICD-10-CM

## 2021-10-19 MED ORDER — AMLODIPINE BESYLATE 10 MG PO TABS: 10.0000 mg | ORAL_TABLET | Freq: Every day | ORAL | 3 refills | Status: DC

## 2021-10-19 MED ORDER — TRAMADOL HCL 50 MG PO TABS: 50.0000 mg | ORAL_TABLET | Freq: Two times a day (BID) | ORAL | 1 refills | Status: DC | PRN

## 2021-10-19 MED ORDER — MEMANTINE HCL 5 MG PO TABS: 5.0000 mg | ORAL_TABLET | Freq: Two times a day (BID) | ORAL | 3 refills | Status: DC

## 2021-10-19 MED ORDER — TRAZODONE HCL 50 MG PO TABS: ORAL_TABLET | ORAL | 3 refills | Status: DC

## 2021-10-19 NOTE — Telephone Encounter (Signed)
Call pt  I consulted with dr Erlene Quan, urology whom didn't think patient needed follow up /surveillance for either renal cyst or adrenal hyperplasia.   This is good news!

## 2021-10-19 NOTE — Assessment & Plan Note (Addendum)
MR abdomen 09/30/21:  Multiple pancreatic cystic lesions measuring up to 1.4 cm. Possible cysts and/or side branch IPMNs. No high risk features. Consider follow-up MRI abdomen with contrast in 2 years, if appropriate given the patient's age. Discussed MRI abdomen and pancreatic cyst.  Informed patient that I have sent message to gastroenterology, Dr. Allen Norris for further advice and as a relates to surveillance of lesion.  She has  follow-up with him later this month.  Patient and daughter verbalized understanding and are will plan to bring MRI report with him to share with Dr. Allen Norris in detail.  Will follow

## 2021-10-19 NOTE — Assessment & Plan Note (Addendum)
Discussed hemorrhagic renal cyst and adrenal hyperplasia with patient and daughter at length today.  Patient reluctant to undergo any further medical evaluation with urology.  I  Consulted via staff message to Dr. Hollice Espy, urology in regards to hormonal evaluation,  Surveillance of renal cysts , adrenal hyperplasia whom didn't feel she didn't follow up for either  Adrenal hyperplasia or hemorrhagic renal cysts.

## 2021-10-19 NOTE — Progress Notes (Signed)
Subjective:    Patient ID: Natalie [name], female    DOB: 11/10/1929, 88 y.o.   MRN: [id number]  CC: Natalie [name] is a 88 y.o. female who presents today for follow up.   HPI: Accompanied by daughter   Feels well today.  No new complaints  Hypertension-compliant amlodipine 10 mg, lisinopril 5 mg, metroprolol tartrate 50 mg bid. No cp, ha, vision changes.      Pernicious anemia- Follow up with Dr. Allen [name] 11/09/2021.  She continues to follow with Dr. Mack [name], orthopedics for closed fracture of multiple pubic rami  She is compliant with tramadol '50mg'$   qd; she continues to have left posterior low back pain. She only received 15 tablets of tramadol with last prescription. Tramadol is helpful for pain. She doesn't feel confused, fatigued tramadol.   follow up Dr. Charlestine [name] next month.  H/o ckd   CT a/p 03/14/2012 -Prominence of both adrenal glands likely hyperplasia kidneys are  normal. No hydronephrosis. No ureteral calculi seen  HISTORY:  Past Medical History:  Diagnosis Date   Allergies    hay fever   Arthritis    Breast cancer, left ([place]) 2015   Left total mastectomy   COPD (chronic obstructive pulmonary disease) ([place])    GERD (gastroesophageal reflux disease)    Hyperlipemia    Hypertension    Stroke ([place]) 2002   TIA   Past Surgical History:  Procedure Laterality Date   BREAST BIOPSY  2014   BREAST LUMPECTOMY  2014   left side   ENDARTERECTOMY Left 02/28/2019   Procedure: ENDARTERECTOMY CAROTID;  Surgeon: Katha Cabal, MD;  Location: [facility] ORS;  Service: Vascular;  Laterality: Left;   EVACUATION OF CERVICAL HEMATOMA Left 02/28/2019   Procedure: EVACUATION OF HEMATOMA;  Surgeon: Katha Cabal, MD;  Location: [facility] ORS;  Service: Vascular;  Laterality: Left;   EYE SURGERY Bilateral    cataract extractions   JOINT REPLACEMENT     MASTECTOMY Left May 9th 2017   [place]   TOTAL HIP ARTHROPLASTY Right    head of the femur  replaced due to fracture   Family History  Problem Relation Age of Onset   Breast cancer Mother    Lung cancer Father     Allergies: Influenza vaccines Current Outpatient Medications on File Prior to Visit  Medication Sig Dispense Refill   aspirin EC 81 MG tablet Take 81 mg by mouth daily. In preparation for surgery 12/18/ After stopping plavix     atorvastatin (LIPITOR) 40 MG tablet TAKE 1 TABLET BY MOUTH EVERY DAY 90 tablet 3   Calcium Citrate-Vitamin D 250-200 MG-UNIT TABS Take 250 mg by mouth every morning.     cholecalciferol (VITAMIN D3) 25 MCG (1000 UT) tablet Take 1,000 Units by mouth daily.      cyanocobalamin (,VITAMIN B-12,) 1000 MCG/ML injection Inject 1,000 mcg into the muscle every 30 (thirty) days.     fluticasone (FLONASE) 50 MCG/ACT nasal spray Place 2 sprays into both nostrils daily as needed for allergies.     ipratropium-albuterol (DUONEB) 0.5-2.5 (3) MG/3ML SOLN Take 3 mLs by nebulization 4 (four) times daily as needed. 360 mL 3   lidocaine (LIDODERM) 5 % Place 1 patch onto the skin daily. Remove & Discard patch within 12 hours or as directed by MD.  Stop after 30 days 30 patch 0   lisinopril (ZESTRIL) 5 MG tablet TAKE 1 TABLET BY MOUTH EVERY DAY 90 tablet 0  metoprolol tartrate (LOPRESSOR) 50 MG tablet TAKE 1/2 TABLET BY MOUTH TWICE A DAY 90 tablet 0   Tiotropium Bromide Monohydrate 2.5 MCG/ACT AERS Inhale 2 puffs into the lungs daily. 4 g 3   Current Facility-Administered Medications on File Prior to Visit  Medication Dose Route Frequency Provider Last Rate Last Admin   ipratropium-albuterol (DUONEB) 0.5-2.5 (3) MG/3ML nebulizer solution 3 mL  3 mL Nebulization Once [name], FNP        Social History   Tobacco Use   Smoking status: Former   Smokeless tobacco: Never   Tobacco comments:    20 years ago  Vaping Use   Vaping Use: Never used  Substance Use Topics   Alcohol use: Yes    Comment: occassionally   Drug use: Never    Review of  Systems  Constitutional:  Negative for chills and fever.  Respiratory:  Negative for cough.   Cardiovascular:  Negative for chest pain and palpitations.  Gastrointestinal:  Negative for nausea and vomiting.      Objective:    BP (!) 140/55   Pulse 67   Temp 97.8 F (36.6 C) (Oral)   Ht '5\' 5"'$  (1.651 m)   Wt 120 lb 9.6 oz (54.7 kg)   SpO2 94%   BMI 20.07 kg/m  BP Readings from Last 3 Encounters:  10/19/21 (!) 140/55  08/25/21 (!) 126/48  08/05/21 (!) 159/63   Wt Readings from Last 3 Encounters:  10/19/21 120 lb 9.6 oz (54.7 kg)  08/22/21 125 lb (56.7 kg)  08/05/21 125 lb (56.7 kg)    Physical Exam Vitals reviewed.  Constitutional:      Appearance: She is well-developed.  Eyes:     Conjunctiva/sclera: Conjunctivae normal.  Cardiovascular:     Rate and Rhythm: Normal rate and regular rhythm.     Pulses: Normal pulses.     Heart sounds: Normal heart sounds.  Pulmonary:     Effort: Pulmonary effort is normal.     Breath sounds: Normal breath sounds. No wheezing, rhonchi or rales.  Skin:    General: Skin is warm and dry.  Neurological:     Mental Status: She is alert.  Psychiatric:        Speech: Speech normal.        Behavior: Behavior normal.        Thought Content: Thought content normal.        Assessment & Plan:   Problem List Items Addressed This Visit       Cardiovascular and Mediastinum   Essential hypertension - Primary    Improved as patient rested in room.  Continue amlodipine 10 mg, lisinopril 5 mg, metoprolol tartrate 50 mg twice daily      Relevant Medications   amLODipine (NORVASC) 10 MG tablet     Digestive   Pancreatic cyst    MR abdomen 09/30/21:  Multiple pancreatic cystic lesions measuring up to 1.4 cm. Possible cysts and/or side branch IPMNs. No high risk features. Consider follow-up MRI abdomen with contrast in 2 years, if appropriate given the patient's age. Discussed MRI abdomen and pancreatic cyst.  Informed patient that I  have sent message to gastroenterology, Dr. Allen [name] for further advice and as a relates to surveillance of lesion.  She has  follow-up with him later this month.  Patient and daughter verbalized understanding and are will plan to bring MRI report with him to share with Dr. Allen [name] in detail.  Will follow  Endocrine   Adrenal hyperplasia ([place])    Discussed hemorrhagic renal cyst and adrenal hyperplasia with patient and daughter at length today.  Patient reluctant to undergo any further medical evaluation with urology.  I  Consulted via staff message to Dr. Hollice Espy, urology in regards to hormonal evaluation,  Surveillance of renal cysts , adrenal hyperplasia whom didn't feel she didn't follow up for either  Adrenal hyperplasia or hemorrhagic renal cysts.         Nervous and Auditory   Senile dementia without behavioral disturbance (HCC) (Chronic)   Relevant Medications   memantine (NAMENDA) 5 MG tablet   traZODone (DESYREL) 50 MG tablet     Musculoskeletal and Integument   Closed fracture of multiple pubic rami, left, initial encounter [facility])    Discussed optimizing pain and quality life for patient.  Currently she has been taking tramadol 50 mg qd prn.   Advised her she is tolerating medication well and tramadol helpful for her pain.  She may increase tramadol to 50 mg twice per day and she will discuss this with pharmacy as previously had only given her 15 tablets.  She may require a prior authorization.      Relevant Medications   traMADol (ULTRAM) 50 MG tablet     Other   Depression, major, single episode, mild (HCC)   Relevant Medications   traZODone (DESYREL) 50 MG tablet   Encounter for medical examination to establish care   Relevant Medications   traZODone (DESYREL) 50 MG tablet     I have changed Joaquim Lai A. Dangerfield's amLODipine and memantine. I am also having her maintain her Calcium Citrate-Vitamin D, cholecalciferol, fluticasone, aspirin EC, atorvastatin, Tiotropium  Bromide Monohydrate, ipratropium-albuterol, cyanocobalamin, lidocaine, lisinopril, metoprolol tartrate, traMADol, and traZODone. We will continue to administer ipratropium-albuterol.   Meds ordered this encounter  Medications   amLODipine (NORVASC) 10 MG tablet    Sig: Take 1 tablet (10 mg total) by mouth daily.    Dispense:  90 tablet    Refill:  3    Order Specific Question:   Supervising Provider    Answer:   Deborra Medina L [[id number]]   memantine (NAMENDA) 5 MG tablet    Sig: Take 1 tablet (5 mg total) by mouth 2 (two) times daily.    Dispense:  180 tablet    Refill:  3    Order Specific Question:   Supervising Provider    Answer:   Deborra Medina L [[id number]]   traMADol (ULTRAM) 50 MG tablet    Sig: Take 1 tablet (50 mg total) by mouth 2 (two) times daily as needed.    Dispense:  60 tablet    Refill:  1    Order Specific Question:   Supervising Provider    Answer:   Deborra Medina L [[id number]]   traZODone (DESYREL) 50 MG tablet    Sig: TAKE 1/2 TABLETS (25 MG) BY MOUTH AT BEDTIME.    Dispense:  45 tablet    Refill:  3    Return precautions given.   Risks, benefits, and alternatives of the medications and treatment plan prescribed today were discussed, and patient expressed understanding.   Education regarding symptom management and diagnosis given to patient on AVS.  Continue to follow with [name], FNP for routine health maintenance.   Natalie [name] and I agreed with plan.   Mable Paris, FNP  I have spent 40  minutes with a patient including precharting, exam, reviewing medical records, consulting with  urology, and discussion plan of care.

## 2021-10-19 NOTE — Assessment & Plan Note (Signed)
Discussed optimizing pain and quality life for patient.  Currently she has been taking tramadol 50 mg qd prn.   Advised her she is tolerating medication well and tramadol helpful for her pain.  She may increase tramadol to 50 mg twice per day and she will discuss this with pharmacy as previously had only given her 15 tablets.  She may require a prior authorization.

## 2021-10-19 NOTE — Assessment & Plan Note (Signed)
Improved as patient rested in room.  Continue amlodipine 10 mg, lisinopril 5 mg, metoprolol tartrate 50 mg twice daily

## 2021-10-19 NOTE — Patient Instructions (Signed)
As discussed, please speak with Dr. Allen Norris ( gastroenterology)  in regards to pancreatic lesion that we have discussed as seen on MRI abdomen and if it warrants any further investigation or further surveillance. I will send a note to urology regarding the kidney findings, adrenal hyperplasia seen on MRI as well to see if urology has a strong feeling that this deserves further workup.  I will get back to you and let you know what they say.

## 2021-10-19 NOTE — Telephone Encounter (Signed)
-----   Message from Hollice Espy, MD sent at 10/19/2021  1:29 PM EDT ----- I don't think she needs further eval or f/u for any of these issues.    Hollice Espy, MD  ----- Message ----- From: [name], FNP Sent: 10/19/2021   1:03 PM EDT To: Hollice Espy, MD  Dr Erlene Quan,   I wanted you advice regarding an especially sweet 88 year old.   She had recent pelvic fracture which resulted in incidental pancreatic, renal, adrenal findings while hospitalized this spring.    I ordered MRI abdomen 09/30/21  for further evaluation and wanted to ask you specifically about   1) Hemorrhagic renal cysts measuring up to 2.7 cm in the left kidney.  2) Adrenal hyperplasia   Sifting through her chart, I found CT a/p at [facility] in 2014 which also showed adrenal hyperplasia.    Understandably, patient is reluctant to undergo further evaluation of the above. Do you feel she need hormonal evaluation for adrenal hyperplasia? Or surveillance if seen in 2014 as well?   Of course, happy to refer her to you.  [name]

## 2021-10-20 NOTE — Telephone Encounter (Signed)
Called and spoke to [place] and informed her that Mom did not have to go back for follow up.[name] stated that they were able to get the 60 tablets with no problem also.

## 2021-10-21 NOTE — Telephone Encounter (Signed)
Noted  

## 2021-10-26 ENCOUNTER — Telehealth: Payer: Self-pay | Admitting: Family

## 2021-10-26 NOTE — Telephone Encounter (Signed)
Allison from [facility] called stating pt has a BP of 168/80. Pt does not have a headache, dizziness or blurred vision. Pt has taken her BP medicine today

## 2021-10-28 ENCOUNTER — Other Ambulatory Visit: Payer: Self-pay

## 2021-10-28 ENCOUNTER — Inpatient Hospital Stay
Admission: EM | Admit: 2021-10-28 | Discharge: 2021-11-07 | DRG: 689 | Disposition: A | Discharge status: Nursing Home (Includes ICF) | Payer: Medicare Other | Attending: Internal Medicine | Admitting: Internal Medicine

## 2021-10-28 ENCOUNTER — Emergency Department: Payer: Medicare Other

## 2021-10-28 ENCOUNTER — Telehealth: Payer: Self-pay

## 2021-10-28 DIAGNOSIS — E785 Hyperlipidemia, unspecified: Secondary | ICD-10-CM | POA: Diagnosis present

## 2021-10-28 DIAGNOSIS — I5032 Chronic diastolic (congestive) heart failure: Secondary | ICD-10-CM | POA: Diagnosis present

## 2021-10-28 DIAGNOSIS — G9341 Metabolic encephalopathy: Secondary | ICD-10-CM | POA: Diagnosis present

## 2021-10-28 DIAGNOSIS — R4182 Altered mental status, unspecified: Principal | ICD-10-CM

## 2021-10-28 DIAGNOSIS — K219 Gastro-esophageal reflux disease without esophagitis: Secondary | ICD-10-CM | POA: Diagnosis not present

## 2021-10-28 DIAGNOSIS — N182 Chronic kidney disease, stage 2 (mild): Secondary | ICD-10-CM | POA: Diagnosis present

## 2021-10-28 DIAGNOSIS — Z91148 Patient's other noncompliance with medication regimen for other reason: Secondary | ICD-10-CM

## 2021-10-28 DIAGNOSIS — Z853 Personal history of malignant neoplasm of breast: Secondary | ICD-10-CM

## 2021-10-28 DIAGNOSIS — N189 Chronic kidney disease, unspecified: Secondary | ICD-10-CM | POA: Diagnosis not present

## 2021-10-28 DIAGNOSIS — F0390 Unspecified dementia without behavioral disturbance: Secondary | ICD-10-CM | POA: Diagnosis present

## 2021-10-28 DIAGNOSIS — F01511 Vascular dementia, unspecified severity, with agitation: Secondary | ICD-10-CM | POA: Diagnosis present

## 2021-10-28 DIAGNOSIS — Z79891 Long term (current) use of opiate analgesic: Secondary | ICD-10-CM | POA: Diagnosis not present

## 2021-10-28 DIAGNOSIS — F01518 Vascular dementia, unspecified severity, with other behavioral disturbance: Secondary | ICD-10-CM | POA: Diagnosis present

## 2021-10-28 DIAGNOSIS — M1990 Unspecified osteoarthritis, unspecified site: Secondary | ICD-10-CM | POA: Diagnosis present

## 2021-10-28 DIAGNOSIS — I6782 Cerebral ischemia: Secondary | ICD-10-CM | POA: Diagnosis not present

## 2021-10-28 DIAGNOSIS — Z9012 Acquired absence of left breast and nipple: Secondary | ICD-10-CM | POA: Diagnosis not present

## 2021-10-28 DIAGNOSIS — Z79899 Other long term (current) drug therapy: Secondary | ICD-10-CM | POA: Diagnosis not present

## 2021-10-28 DIAGNOSIS — N3000 Acute cystitis without hematuria: Secondary | ICD-10-CM | POA: Diagnosis not present

## 2021-10-28 DIAGNOSIS — Z87891 Personal history of nicotine dependence: Secondary | ICD-10-CM | POA: Diagnosis not present

## 2021-10-28 DIAGNOSIS — J439 Emphysema, unspecified: Secondary | ICD-10-CM | POA: Diagnosis not present

## 2021-10-28 DIAGNOSIS — J449 Chronic obstructive pulmonary disease, unspecified: Secondary | ICD-10-CM | POA: Diagnosis present

## 2021-10-28 DIAGNOSIS — S0990XA Unspecified injury of head, initial encounter: Secondary | ICD-10-CM | POA: Diagnosis not present

## 2021-10-28 DIAGNOSIS — T465X6A Underdosing of other antihypertensive drugs, initial encounter: Secondary | ICD-10-CM | POA: Diagnosis present

## 2021-10-28 DIAGNOSIS — Z803 Family history of malignant neoplasm of breast: Secondary | ICD-10-CM | POA: Diagnosis not present

## 2021-10-28 DIAGNOSIS — Z7982 Long term (current) use of aspirin: Secondary | ICD-10-CM | POA: Diagnosis not present

## 2021-10-28 DIAGNOSIS — Z8673 Personal history of transient ischemic attack (TIA), and cerebral infarction without residual deficits: Secondary | ICD-10-CM | POA: Diagnosis not present

## 2021-10-28 DIAGNOSIS — H9190 Unspecified hearing loss, unspecified ear: Secondary | ICD-10-CM

## 2021-10-28 DIAGNOSIS — I130 Hypertensive heart and chronic kidney disease with heart failure and stage 1 through stage 4 chronic kidney disease, or unspecified chronic kidney disease: Secondary | ICD-10-CM | POA: Diagnosis present

## 2021-10-28 DIAGNOSIS — Z887 Allergy status to serum and vaccine status: Secondary | ICD-10-CM | POA: Diagnosis not present

## 2021-10-28 DIAGNOSIS — I6529 Occlusion and stenosis of unspecified carotid artery: Secondary | ICD-10-CM | POA: Diagnosis present

## 2021-10-28 DIAGNOSIS — K5900 Constipation, unspecified: Secondary | ICD-10-CM | POA: Diagnosis not present

## 2021-10-28 DIAGNOSIS — I10 Essential (primary) hypertension: Secondary | ICD-10-CM

## 2021-10-28 DIAGNOSIS — Z96641 Presence of right artificial hip joint: Secondary | ICD-10-CM | POA: Diagnosis present

## 2021-10-28 DIAGNOSIS — N390 Urinary tract infection, site not specified: Secondary | ICD-10-CM | POA: Diagnosis present

## 2021-10-28 DIAGNOSIS — G319 Degenerative disease of nervous system, unspecified: Secondary | ICD-10-CM | POA: Diagnosis not present

## 2021-10-28 DIAGNOSIS — I6523 Occlusion and stenosis of bilateral carotid arteries: Secondary | ICD-10-CM | POA: Diagnosis not present

## 2021-10-28 LAB — URINALYSIS, ROUTINE W REFLEX MICROSCOPIC
Bilirubin Urine: NEGATIVE
Glucose, UA: NEGATIVE mg/dL
Ketones, ur: NEGATIVE mg/dL
Nitrite: NEGATIVE
Protein, ur: NEGATIVE mg/dL
Specific Gravity, Urine: 1.011 (ref 1.005–1.030)
pH: 5 (ref 5.0–8.0)

## 2021-10-28 LAB — CBC WITH DIFFERENTIAL/PLATELET
Abs Immature Granulocytes: 0.02 10*3/uL (ref 0.00–0.07)
Basophils Absolute: 0 10*3/uL (ref 0.0–0.1)
Basophils Relative: 1 %
Eosinophils Absolute: 0.1 10*3/uL (ref 0.0–0.5)
Eosinophils Relative: 2 %
HCT: 32.8 % — ABNORMAL LOW (ref 36.0–46.0)
Hemoglobin: 11 g/dL — ABNORMAL LOW (ref 12.0–15.0)
Immature Granulocytes: 0 %
Lymphocytes Relative: 15 %
Lymphs Abs: 1.1 10*3/uL (ref 0.7–4.0)
MCH: 31.1 pg (ref 26.0–34.0)
MCHC: 33.5 g/dL (ref 30.0–36.0)
MCV: 92.7 fL (ref 80.0–100.0)
Monocytes Absolute: 0.8 10*3/uL (ref 0.1–1.0)
Monocytes Relative: 10 %
Neutro Abs: 5.3 10*3/uL (ref 1.7–7.7)
Neutrophils Relative %: 72 %
Platelets: 178 10*3/uL (ref 150–400)
RBC: 3.54 MIL/uL — ABNORMAL LOW (ref 3.87–5.11)
RDW: 12.7 % (ref 11.5–15.5)
WBC: 7.4 10*3/uL (ref 4.0–10.5)
nRBC: 0 % (ref 0.0–0.2)

## 2021-10-28 LAB — COMPREHENSIVE METABOLIC PANEL
ALT: 15 U/L (ref 0–44)
AST: 20 U/L (ref 15–41)
Albumin: 4 g/dL (ref 3.5–5.0)
Alkaline Phosphatase: 76 U/L (ref 38–126)
Anion gap: 9 (ref 5–15)
BUN: 23 mg/dL (ref 8–23)
CO2: 25 mmol/L (ref 22–32)
Calcium: 9.6 mg/dL (ref 8.9–10.3)
Chloride: 102 mmol/L (ref 98–111)
Creatinine, Ser: 1.1 mg/dL — ABNORMAL HIGH (ref 0.44–1.00)
GFR, Estimated: 48 mL/min — ABNORMAL LOW (ref >60)
Glucose, Bld: 128 mg/dL — ABNORMAL HIGH (ref 70–99)
Potassium: 3.9 mmol/L (ref 3.5–5.1)
Sodium: 136 mmol/L (ref 135–145)
Total Bilirubin: 0.7 mg/dL (ref 0.3–1.2)
Total Protein: 7 g/dL (ref 6.5–8.1)

## 2021-10-28 LAB — TROPONIN I (HIGH SENSITIVITY)
Troponin I (High Sensitivity): 12 ng/L (ref <18)
Troponin I (High Sensitivity): 11 ng/L (ref <18)

## 2021-10-28 MED ORDER — METOPROLOL TARTRATE 25 MG PO TABS
25.0000 mg | ORAL_TABLET | Freq: Two times a day (BID) | ORAL | Status: DC
  Administered 2021-10-28 – 2021-11-07 (×19): 25 mg via ORAL
  Filled 2021-10-28 (×19): qty 1

## 2021-10-28 MED ORDER — TRAZODONE HCL 50 MG PO TABS
50.0000 mg | ORAL_TABLET | Freq: Every day | ORAL | Status: DC
  Administered 2021-10-28 – 2021-11-06 (×10): 50 mg via ORAL
  Filled 2021-10-28 (×10): qty 1

## 2021-10-28 MED ORDER — IPRATROPIUM-ALBUTEROL 0.5-2.5 (3) MG/3ML IN SOLN: 3.0000 mL | Freq: Four times a day (QID) | RESPIRATORY_TRACT | Status: DC | PRN

## 2021-10-28 MED ORDER — AMLODIPINE BESYLATE 5 MG PO TABS
10.0000 mg | ORAL_TABLET | Freq: Once | ORAL | Status: AC
  Administered 2021-10-28: 10 mg via ORAL
  Filled 2021-10-28: qty 2

## 2021-10-28 MED ORDER — SODIUM CHLORIDE 0.9 % IV SOLN
1.0000 g | Freq: Once | INTRAVENOUS | Status: AC
  Administered 2021-10-29: 1 g via INTRAVENOUS
  Filled 2021-10-28: qty 10

## 2021-10-28 MED ORDER — LISINOPRIL 5 MG PO TABS
5.0000 mg | ORAL_TABLET | Freq: Every day | ORAL | Status: DC
  Administered 2021-10-28 – 2021-11-02 (×6): 5 mg via ORAL
  Filled 2021-10-28 (×7): qty 1

## 2021-10-28 MED ORDER — AMLODIPINE BESYLATE 10 MG PO TABS
10.0000 mg | ORAL_TABLET | Freq: Every day | ORAL | Status: DC
  Administered 2021-10-29 – 2021-11-07 (×10): 10 mg via ORAL
  Filled 2021-10-28: qty 1
  Filled 2021-10-28: qty 2
  Filled 2021-10-28 (×9): qty 1

## 2021-10-28 MED ORDER — MEMANTINE HCL 5 MG PO TABS
5.0000 mg | ORAL_TABLET | Freq: Two times a day (BID) | ORAL | Status: DC
  Administered 2021-10-28 – 2021-11-07 (×20): 5 mg via ORAL
  Filled 2021-10-28 (×20): qty 1
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
## 2021-10-28 NOTE — ED Notes (Addendum)
This Rn realizing that Pt had IV placed in LARM during triage. This rn observed in pt history that they had a mastectomy and lymphectomy in 2017. RN was unaware during triage as IV was placed prior to history review by Paramedic. IV has not been used at this time. RN to inform MD

## 2021-10-28 NOTE — ED Notes (Signed)
Pt to MRI

## 2021-10-28 NOTE — ED Provider Notes (Signed)
[facility] Provider Note    Event Date/Time   First MD Initiated Contact with Patient 10/28/21 1644     (approximate)   History   Altered Mental Status  Level v Caveat:  dementia  HPI  Natalie [name] is a 88 y.o. female history of dementia presents to the ER for evaluation of agitation and confusion worsening over the past 24 to 48 hours.  Questionable UTI.  Reportedly not taking her blood pressure medication.  Patient currently calm but reportedly very agitated and almost combative with family.      Physical Exam   Triage Vital Signs: ED Triage Vitals  Enc Vitals Group     BP      Pulse      Resp      Temp      Temp src      SpO2      Weight      Height      Head Circumference      Peak Flow      Pain Score      Pain Loc      Pain Edu?      Excl. in [place]?     Most recent vital signs: Vitals:   10/28/21 2130 10/28/21 2304  BP: (!) 193/92 (!) 184/89  Pulse: 84 86  Resp: 14 20  Temp:  97.7 F (36.5 C)  SpO2: 96% 99%     Constitutional: Alert  Eyes: Conjunctivae are normal.  Head: Atraumatic. Nose: No congestion/rhinnorhea. Mouth/Throat: Mucous membranes are moist.   Neck: Painless ROM.  Cardiovascular:   Good peripheral circulation. Respiratory: Normal respiratory effort.  No retractions.  Gastrointestinal: Soft and nontender.  Musculoskeletal:  no deformity Neurologic:  MAE spontaneously. No gross focal neurologic deficits are appreciated.  Skin:  Skin is warm, dry and intact. No rash noted. Psychiatric: calm and cooperative    ED Results / Procedures / Treatments   Labs (all labs ordered are listed, but only abnormal results are displayed) Labs Reviewed  CBC WITH DIFFERENTIAL/PLATELET - Abnormal; Notable for the following components:      Result Value   RBC 3.54 (*)    Hemoglobin 11.0 (*)    HCT 32.8 (*)    All other components within normal limits  COMPREHENSIVE METABOLIC PANEL - Abnormal; Notable for the  following components:   Glucose, Bld 128 (*)    Creatinine, Ser 1.10 (*)    GFR, Estimated 48 (*)    All other components within normal limits  URINALYSIS, ROUTINE W REFLEX MICROSCOPIC - Abnormal; Notable for the following components:   Color, Urine YELLOW (*)    APPearance CLEAR (*)    Hgb urine dipstick SMALL (*)    Leukocytes,Ua LARGE (*)    Bacteria, UA RARE (*)    All other components within normal limits  URINE CULTURE  TROPONIN I (HIGH SENSITIVITY)  TROPONIN I (HIGH SENSITIVITY)     EKG  ED ECG REPORT I, Merlyn Lot, the attending physician, personally viewed and interpreted this ECG.   Date: 10/28/2021  EKG Time: 16:58  Rate: 90  Rhythm: sinus  Axis: normal  Intervals: normal  ST&T Change: no stemi, no depression    RADIOLOGY Please see ED Course for my review and interpretation.  I personally reviewed all radiographic images ordered to evaluate for the above acute complaints and reviewed radiology reports and findings.  These findings were personally discussed with the patient.  Please see medical record for  radiology report.    PROCEDURES:  Critical Care performed: No  Procedures   MEDICATIONS ORDERED IN ED: Medications  amLODipine (NORVASC) tablet 10 mg (10 mg Oral Not Given 10/28/21 2306)  ipratropium-albuterol (DUONEB) 0.5-2.5 (3) MG/3ML nebulizer solution 3 mL (has no administration in time range)  lisinopril (ZESTRIL) tablet 5 mg (5 mg Oral Given 10/28/21 2306)  memantine (NAMENDA) tablet 5 mg (5 mg Oral Given 10/28/21 2306)  metoprolol tartrate (LOPRESSOR) tablet 25 mg (25 mg Oral Given 10/28/21 2306)  traZODone (DESYREL) tablet 50 mg (50 mg Oral Given 10/28/21 2306)  cefTRIAXone (ROCEPHIN) 1 g in sodium chloride 0.9 % 100 mL IVPB (has no administration in time range)  amLODipine (NORVASC) tablet 10 mg (10 mg Oral Given 10/28/21 1834)     IMPRESSION / MDM / ASSESSMENT AND PLAN / ED COURSE  I reviewed the triage vital signs and the nursing  notes.                              Differential diagnosis includes, but is not limited to, Dehydration, sepsis, pna, uti, hypoglycemia, cva, drug effect, withdrawal, encephalitis  Patient presented to the ER for evaluation of symptoms as described above.  This presenting complaint could reflect a potentially life-threatening illness therefore the patient will be placed on continuous pulse oximetry and telemetry for monitoring.  Laboratory evaluation will be sent to evaluate for the above complaints.  Imaging ordered for the above differential.  Will check urine.   Clinical Course as of 10/29/21 0000  Fri Oct 28, 2021  2346 Given her altered mental status and infected appearing urine will place on Rocephin.  Still awaiting MRI but given her hypertension and medical issues I do believe admission to the hospital clinically indicated.  I consulted hospitalist for admission. [PR]    Clinical Course User Index [PR] Merlyn Lot, MD     FINAL CLINICAL IMPRESSION(S) / ED DIAGNOSES   Final diagnoses:  Altered mental status, unspecified altered mental status type  Acute cystitis without hematuria  Hypertension, unspecified type     Rx / DC Orders   ED Discharge Orders     None        Note:  This document was prepared using Dragon voice recognition software and may include unintentional dictation errors.    Merlyn Lot, MD 10/29/21 0000
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
## 2021-10-28 NOTE — ED Notes (Signed)
Pt placed on posey bed alarm

## 2021-10-28 NOTE — ED Notes (Signed)
Pt assisted to toilet. Pt assisted to bed. Pt undressed and redressed in gown.
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
## 2021-10-28 NOTE — Telephone Encounter (Signed)
Spoke with daughter  Advised concern for acute change in personality, mental status.  Concern for whether delirium/ infectious etiology versus stroke.  She also has underlying dementia.  Advised daughter to go emergency room for further evaluation.  Patient will consider this this afternoon.  She will let me know. She also request a referral to social work so she can start considering assisted living options which I have placed

## 2021-10-28 NOTE — Telephone Encounter (Signed)
Spoke to [facility] and she stated that they do have appropriate BP machine

## 2021-10-28 NOTE — ED Notes (Signed)
Urine sent
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
## 2021-10-28 NOTE — Telephone Encounter (Signed)
Call and triage  Ensure pt has accurate Bp cuff and taking bp correctly
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
## 2021-10-28 NOTE — ED Notes (Signed)
This rn into pt to ask if it is ok If I speak to patient daughter Natalie [name], who has called to give some background to the events of today. Pt stating "you can talk to her but shes going to try to convince you I'm crazy."

## 2021-10-29 DIAGNOSIS — N3000 Acute cystitis without hematuria: Secondary | ICD-10-CM

## 2021-10-29 DIAGNOSIS — F01518 Vascular dementia, unspecified severity, with other behavioral disturbance: Secondary | ICD-10-CM

## 2021-10-29 DIAGNOSIS — E785 Hyperlipidemia, unspecified: Secondary | ICD-10-CM

## 2021-10-29 DIAGNOSIS — I6523 Occlusion and stenosis of bilateral carotid arteries: Secondary | ICD-10-CM | POA: Diagnosis not present

## 2021-10-29 DIAGNOSIS — I5032 Chronic diastolic (congestive) heart failure: Secondary | ICD-10-CM

## 2021-10-29 DIAGNOSIS — K219 Gastro-esophageal reflux disease without esophagitis: Secondary | ICD-10-CM

## 2021-10-29 DIAGNOSIS — G9341 Metabolic encephalopathy: Secondary | ICD-10-CM | POA: Diagnosis not present

## 2021-10-29 DIAGNOSIS — N189 Chronic kidney disease, unspecified: Secondary | ICD-10-CM

## 2021-10-29 DIAGNOSIS — H9190 Unspecified hearing loss, unspecified ear: Secondary | ICD-10-CM

## 2021-10-29 DIAGNOSIS — I10 Essential (primary) hypertension: Secondary | ICD-10-CM

## 2021-10-29 DIAGNOSIS — J449 Chronic obstructive pulmonary disease, unspecified: Secondary | ICD-10-CM

## 2021-10-29 LAB — CBC
HCT: 31.9 % — ABNORMAL LOW (ref 36.0–46.0)
Hemoglobin: 10.7 g/dL — ABNORMAL LOW (ref 12.0–15.0)
MCH: 31.3 pg (ref 26.0–34.0)
MCHC: 33.5 g/dL (ref 30.0–36.0)
MCV: 93.3 fL (ref 80.0–100.0)
Platelets: 169 10*3/uL (ref 150–400)
RBC: 3.42 MIL/uL — ABNORMAL LOW (ref 3.87–5.11)
RDW: 12.8 % (ref 11.5–15.5)
WBC: 6.7 10*3/uL (ref 4.0–10.5)
nRBC: 0 % (ref 0.0–0.2)

## 2021-10-29 LAB — COMPREHENSIVE METABOLIC PANEL
ALT: 12 U/L (ref 0–44)
AST: 18 U/L (ref 15–41)
Albumin: 3.5 g/dL (ref 3.5–5.0)
Alkaline Phosphatase: 63 U/L (ref 38–126)
Anion gap: 6 (ref 5–15)
BUN: 20 mg/dL (ref 8–23)
CO2: 23 mmol/L (ref 22–32)
Calcium: 9 mg/dL (ref 8.9–10.3)
Chloride: 107 mmol/L (ref 98–111)
Creatinine, Ser: 1.08 mg/dL — ABNORMAL HIGH (ref 0.44–1.00)
GFR, Estimated: 49 mL/min — ABNORMAL LOW (ref >60)
Glucose, Bld: 191 mg/dL — ABNORMAL HIGH (ref 70–99)
Potassium: 3.6 mmol/L (ref 3.5–5.1)
Sodium: 136 mmol/L (ref 135–145)
Total Bilirubin: 0.5 mg/dL (ref 0.3–1.2)
Total Protein: 6.2 g/dL — ABNORMAL LOW (ref 6.5–8.1)

## 2021-10-29 MED ORDER — ACETAMINOPHEN 325 MG PO TABS
650.0000 mg | ORAL_TABLET | Freq: Four times a day (QID) | ORAL | Status: DC | PRN
  Administered 2021-11-02: 650 mg via ORAL
  Filled 2021-10-29: qty 2

## 2021-10-29 MED ORDER — ENOXAPARIN SODIUM 40 MG/0.4ML IJ SOSY
40.0000 mg | PREFILLED_SYRINGE | INTRAMUSCULAR | Status: DC
  Administered 2021-10-29 – 2021-11-07 (×10): 40 mg via SUBCUTANEOUS
  Filled 2021-10-29 (×10): qty 0.4

## 2021-10-29 MED ORDER — TIOTROPIUM BROMIDE MONOHYDRATE 18 MCG IN CAPS
1.0000 | ORAL_CAPSULE | Freq: Every day | RESPIRATORY_TRACT | Status: DC
Start: 2021-10-29 — End: 2021-11-07
  Administered 2021-10-29 – 2021-11-07 (×10): 18 ug via RESPIRATORY_TRACT
  Filled 2021-10-29 (×2): qty 5

## 2021-10-29 MED ORDER — SODIUM CHLORIDE 0.9 % IV SOLN
1.0000 g | INTRAVENOUS | Status: DC
Start: 2021-10-30 — End: 2021-10-31
  Administered 2021-10-30 – 2021-10-31 (×2): 1 g via INTRAVENOUS
  Filled 2021-10-29: qty 1
  Filled 2021-10-29: qty 10

## 2021-10-29 MED ORDER — DIVALPROEX SODIUM 125 MG PO DR TAB
125.0000 mg | DELAYED_RELEASE_TABLET | Freq: Two times a day (BID) | ORAL | Status: DC
  Administered 2021-10-29 – 2021-11-07 (×19): 125 mg via ORAL
  Filled 2021-10-29 (×19): qty 1

## 2021-10-29 MED ORDER — RISPERIDONE 0.5 MG PO TABS: 0.2500 mg | ORAL_TABLET | Freq: Every day | ORAL | Status: DC | PRN

## 2021-10-29 MED ORDER — ONDANSETRON HCL 4 MG PO TABS: 4.0000 mg | ORAL_TABLET | Freq: Four times a day (QID) | ORAL | Status: DC | PRN

## 2021-10-29 MED ORDER — ONDANSETRON HCL 4 MG/2ML IJ SOLN: 4.0000 mg | Freq: Four times a day (QID) | INTRAMUSCULAR | Status: DC | PRN

## 2021-10-29 MED ORDER — ATORVASTATIN CALCIUM 20 MG PO TABS
40.0000 mg | ORAL_TABLET | Freq: Every day | ORAL | Status: DC
  Administered 2021-10-29 – 2021-11-07 (×10): 40 mg via ORAL
  Filled 2021-10-29 (×10): qty 2

## 2021-10-29 MED ORDER — DEXTROSE-NACL 5-0.9 % IV SOLN
INTRAVENOUS | Status: DC
Start: 2021-10-29 — End: 2021-10-30

## 2021-10-29 MED ORDER — ACETAMINOPHEN 650 MG RE SUPP: 650.0000 mg | Freq: Four times a day (QID) | RECTAL | Status: DC | PRN

## 2021-10-29 MED ORDER — ASPIRIN 81 MG PO TBEC
81.0000 mg | DELAYED_RELEASE_TABLET | Freq: Every day | ORAL | Status: DC
  Administered 2021-10-29 – 2021-11-07 (×10): 81 mg via ORAL
  Filled 2021-10-29 (×10): qty 1
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
## 2021-10-29 NOTE — Assessment & Plan Note (Addendum)
Noted. Possibly due to UTI, although the patient also carries a diagnosis of vascular dementia. The patient has been started on IV antibiotics. Psychiatry has also seen the patient and added depakote and risperdal.  The patient seems calm and pleasant again this morning.

## 2021-10-29 NOTE — Assessment & Plan Note (Signed)
UA is indicative of UTI. Urine culture thus far has had no growth. The patient is receiving IV Rocephin.

## 2021-10-29 NOTE — Assessment & Plan Note (Signed)
Noted. Patient appears to be euvolemic. No pulmonary edema or central vascular congestion on CXR.

## 2021-10-29 NOTE — Assessment & Plan Note (Signed)
Noted. No evidence of infarct on CT head or MRI brain.

## 2021-10-29 NOTE — Plan of Care (Signed)

## 2021-10-29 NOTE — Assessment & Plan Note (Signed)
Continue lipitor 40 mg daily as at home.

## 2021-10-29 NOTE — Assessment & Plan Note (Signed)
Noted  

## 2021-10-29 NOTE — Assessment & Plan Note (Signed)
Baseline creatinine appears to be about 1. Creatinine upon admission was 1.10. This likely reflects CKD IIa.

## 2021-10-29 NOTE — Assessment & Plan Note (Signed)
Noted. The patient is not on a PPI at home. Defer to PCP.

## 2021-10-29 NOTE — Assessment & Plan Note (Signed)
Noted. Stable. The patient is saturating in the mid-nineties on room air. CXR is unremarkable.

## 2021-10-29 NOTE — Assessment & Plan Note (Signed)
The patient was brought to the ED by family due to increasing agitation and confusion with an outburst of violent behavior on 10/28/2021. The patient is on memantine and trazodone at home. She has not had behavioral issues until very recently. Psychiatry has seen the patient and has started her on depakote and risperdal.
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
## 2021-10-30 DIAGNOSIS — I6523 Occlusion and stenosis of bilateral carotid arteries: Secondary | ICD-10-CM | POA: Diagnosis not present

## 2021-10-30 DIAGNOSIS — N3000 Acute cystitis without hematuria: Secondary | ICD-10-CM | POA: Diagnosis not present

## 2021-10-30 DIAGNOSIS — G9341 Metabolic encephalopathy: Secondary | ICD-10-CM | POA: Diagnosis not present

## 2021-10-30 DIAGNOSIS — I5032 Chronic diastolic (congestive) heart failure: Secondary | ICD-10-CM | POA: Diagnosis not present

## 2021-10-30 LAB — URINE CULTURE: Culture: 30000 — AB

## 2021-10-30 LAB — CBC WITH DIFFERENTIAL/PLATELET
Abs Immature Granulocytes: 0.02 10*3/uL (ref 0.00–0.07)
Basophils Absolute: 0 10*3/uL (ref 0.0–0.1)
Basophils Relative: 1 %
Eosinophils Absolute: 0.2 10*3/uL (ref 0.0–0.5)
Eosinophils Relative: 3 %
HCT: 32.5 % — ABNORMAL LOW (ref 36.0–46.0)
Hemoglobin: 10.7 g/dL — ABNORMAL LOW (ref 12.0–15.0)
Immature Granulocytes: 0 %
Lymphocytes Relative: 16 %
Lymphs Abs: 1.1 10*3/uL (ref 0.7–4.0)
MCH: 30.7 pg (ref 26.0–34.0)
MCHC: 32.9 g/dL (ref 30.0–36.0)
MCV: 93.4 fL (ref 80.0–100.0)
Monocytes Absolute: 0.7 10*3/uL (ref 0.1–1.0)
Monocytes Relative: 10 %
Neutro Abs: 4.8 10*3/uL (ref 1.7–7.7)
Neutrophils Relative %: 70 %
Platelets: 165 10*3/uL (ref 150–400)
RBC: 3.48 MIL/uL — ABNORMAL LOW (ref 3.87–5.11)
RDW: 12.5 % (ref 11.5–15.5)
WBC: 6.9 10*3/uL (ref 4.0–10.5)
nRBC: 0 % (ref 0.0–0.2)

## 2021-10-30 LAB — BASIC METABOLIC PANEL
Anion gap: 6 (ref 5–15)
BUN: 16 mg/dL (ref 8–23)
CO2: 23 mmol/L (ref 22–32)
Calcium: 8.7 mg/dL — ABNORMAL LOW (ref 8.9–10.3)
Chloride: 108 mmol/L (ref 98–111)
Creatinine, Ser: 0.82 mg/dL (ref 0.44–1.00)
GFR, Estimated: >60 mL/min (ref >60)
Glucose, Bld: 103 mg/dL — ABNORMAL HIGH (ref 70–99)
Potassium: 3.8 mmol/L (ref 3.5–5.1)
Sodium: 137 mmol/L (ref 135–145)

## 2021-10-30 MED ORDER — DOCUSATE SODIUM 100 MG PO CAPS
200.0000 mg | ORAL_CAPSULE | Freq: Once | ORAL | Status: AC
  Administered 2021-10-30: 200 mg via ORAL
  Filled 2021-10-30: qty 2

## 2021-10-30 MED ORDER — SODIUM CHLORIDE 0.9 % IV SOLN: INTRAVENOUS | Status: DC

## 2021-10-30 MED ORDER — DOCUSATE SODIUM 100 MG PO CAPS: 200.0000 mg | ORAL_CAPSULE | Freq: Once | ORAL | Status: DC

## 2021-10-30 NOTE — Plan of Care (Signed)
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
## 2021-10-30 NOTE — Assessment & Plan Note (Addendum)
Blood pressures appear to be high, although measurements after morning meds were given are not available. The patient is currently receiving amlodipine 5 mg daily, lisinopril 5 mg daily and metoprolol 25 mg bid. Will increase amlodipine to 10 mg daily. Will wait for more recent information before making further adjustments.

## 2021-10-31 DIAGNOSIS — G9341 Metabolic encephalopathy: Secondary | ICD-10-CM | POA: Diagnosis not present

## 2021-10-31 DIAGNOSIS — I6523 Occlusion and stenosis of bilateral carotid arteries: Secondary | ICD-10-CM | POA: Diagnosis not present

## 2021-10-31 DIAGNOSIS — N3000 Acute cystitis without hematuria: Secondary | ICD-10-CM | POA: Diagnosis not present

## 2021-10-31 DIAGNOSIS — I5032 Chronic diastolic (congestive) heart failure: Secondary | ICD-10-CM | POA: Diagnosis not present

## 2021-10-31 LAB — CBC WITH DIFFERENTIAL/PLATELET
Abs Immature Granulocytes: 0.02 K/uL (ref 0.00–0.07)
Basophils Absolute: 0 K/uL (ref 0.0–0.1)
Basophils Relative: 1 %
Eosinophils Absolute: 0.2 K/uL (ref 0.0–0.5)
Eosinophils Relative: 2 %
HCT: 33.3 % — ABNORMAL LOW (ref 36.0–46.0)
Hemoglobin: 10.8 g/dL — ABNORMAL LOW (ref 12.0–15.0)
Immature Granulocytes: 0 %
Lymphocytes Relative: 10 %
Lymphs Abs: 0.8 K/uL (ref 0.7–4.0)
MCH: 30.2 pg (ref 26.0–34.0)
MCHC: 32.4 g/dL (ref 30.0–36.0)
MCV: 93 fL (ref 80.0–100.0)
Monocytes Absolute: 0.6 K/uL (ref 0.1–1.0)
Monocytes Relative: 8 %
Neutro Abs: 6.1 K/uL (ref 1.7–7.7)
Neutrophils Relative %: 79 %
Platelets: 164 K/uL (ref 150–400)
RBC: 3.58 MIL/uL — ABNORMAL LOW (ref 3.87–5.11)
RDW: 12.6 % (ref 11.5–15.5)
WBC: 7.7 K/uL (ref 4.0–10.5)
nRBC: 0 % (ref 0.0–0.2)

## 2021-10-31 LAB — BASIC METABOLIC PANEL
Anion gap: 5 (ref 5–15)
BUN: 11 mg/dL (ref 8–23)
CO2: 25 mmol/L (ref 22–32)
Calcium: 8.9 mg/dL (ref 8.9–10.3)
Chloride: 108 mmol/L (ref 98–111)
Creatinine, Ser: 0.83 mg/dL (ref 0.44–1.00)
GFR, Estimated: >60 mL/min (ref >60)
Glucose, Bld: 128 mg/dL — ABNORMAL HIGH (ref 70–99)
Potassium: 3.9 mmol/L (ref 3.5–5.1)
Sodium: 138 mmol/L (ref 135–145)

## 2021-10-31 MED ORDER — POLYETHYLENE GLYCOL 3350 17 G PO PACK
17.0000 g | PACK | Freq: Every day | ORAL | Status: DC
  Administered 2021-10-31 – 2021-11-07 (×8): 17 g via ORAL
  Filled 2021-10-31 (×8): qty 1

## 2021-10-31 NOTE — Plan of Care (Signed)

## 2021-10-31 NOTE — TOC Initial Note (Signed)
Transition of Care [facility]) - Initial/Assessment Note    Patient Details  Name: Natalie [name] MRN: [id number] Date of Birth: 13-Nov-1929  Transition of Care [facility]) CM/SW Contact:    Pete Pelt, RN Phone Number: 10/31/2021, 1:25 PM  Clinical Narrative:     Patient typically lives with daughter, who provides care for patient.  Daughter and son expressed that patient has recently expressed that she no longer wants to be in patient's home.  Daughter shared that patient wanders in neighborhood and there are safety concerns.  RNCM provided education to patient and family regarding dementia behaviors, and resources, including Alzheimer's Association 24/7 helpline, how to handle behaviors and what to look for when changing caregivers.  RNCM also referred family to [email or web address] website and provided resources for dementia alliance of [place].  Family would like to place patient in Assisted Living or memory care in [facility] due t o family location.  Resources for [employer] provided to family.  RNCM discussed placement timelines with family and state that patient may be hospitalized for the full time between hospital and facility.  RNCM discussed for family to have a transition plan in case it is needed.  Family plans to tour facilities today and communicate timelines with RNCM upon return            Expected Discharge Plan:  (TBD) Barriers to Discharge: Continued Medical Work up   Patient Goals and CMS Choice     Choice offered to / list presented to : Adult Children  Expected Discharge Plan and Services Expected Discharge Plan:  (TBD)     Post Acute Care Choice:  (TBD, possible ALF/memory care)                                        Prior Living Arrangements/Services   Lives with:: Self, Adult Children Patient language and need for interpreter reviewed:: Yes        Need for Family Participation in Patient Care: Yes (Comment) Care giver support system in place?: Yes  (comment)   Criminal Activity/Legal Involvement Pertinent to Current Situation/Hospitalization: No - Comment as needed  Activities of Daily Living Home Assistive Devices/Equipment: None ADL Screening (condition at time of admission) Patient's cognitive ability adequate to safely complete daily activities?: No Is the patient deaf or have difficulty hearing?: Yes Does the patient have difficulty seeing, even when wearing glasses/contacts?: No Does the patient have difficulty concentrating, remembering, or making decisions?: Yes Patient able to express need for assistance with ADLs?: Yes Does the patient have difficulty dressing or bathing?: Yes Independently performs ADLs?: No Does the patient have difficulty walking or climbing stairs?: Yes Weakness of Legs: Both Weakness of Arms/Hands: None  Permission Sought/Granted Permission sought to share information with : Case Manager, Chartered certified accountant granted to share information with : Yes, Verbal Permission Granted     Permission granted to share info w AGENCY: Potential ALF or home health        Emotional Assessment   Attitude/Demeanor/Rapport: Gracious Affect (typically observed): Pleasant (Dementia, spoke with adult children) Orientation: : Oriented to Self, Oriented to Place Alcohol / Substance Use: Not Applicable Psych Involvement: No (comment)  Admission diagnosis:  Acute cystitis without hematuria [N30.00] Altered mental status, unspecified altered mental status type [I77.82] Acute metabolic encephalopathy [U23.53] Hypertension, unspecified type [I10] Patient Active Problem List   Diagnosis Date Noted   Vascular  dementia of acute onset with behavioral disturbance ([place]) 94/85/4627   Acute metabolic encephalopathy 03/50/0938   UTI (urinary tract infection) 10/28/2021   Pancreatic cyst 10/19/2021   Adrenal hyperplasia ([place]) 10/19/2021   Osteopenia 09/21/2021   Leg swelling 09/21/2021   Aortic  atherosclerosis (HCC) 09/21/2021   Chronic diastolic CHF (congestive heart failure) ([place]) 08/24/2021   Accidental fall 08/23/2021   Closed fracture of multiple pubic rami, left, initial encounter ([place]) 08/23/2021   ABLA (acute blood loss anemia) 08/23/2021   Pelvic hematoma, traumatic 08/23/2021   Pernicious anemia 07/22/2021   Bronchitis 07/18/2021   Headache 06/15/2021   CKD (chronic kidney disease) 10/12/2020   Breast mass, right 10/21/2019   Wound of right ankle 07/17/2019   Dysuria 03/28/2019   Carotid artery stenosis 02/28/2019   Carotid stenosis, symptomatic w/o infarct, left 10/17/2018   Chest wall pain 09/20/2018   Encounter for medical examination to establish care 02/27/2018   Hearing loss 02/27/2018   Screening for osteoporosis 02/27/2018   Depression, major, single episode, mild ([place]) 02/27/2018   HTN (hypertension), malignant 02/27/2018   Memory changes 02/27/2018   COPD (chronic obstructive pulmonary disease) ([place]) 02/27/2018   HLD (hyperlipidemia) 02/27/2018   History of TIA (transient ischemic attack) 02/27/2018   Prediabetes 11/17/2015   Seroma 12/18/2014   Personal history of malignant neoplasm of breast 08/22/2014   History of fracture of right hip 05/26/2014   Peripheral vascular disease ([place]) 05/26/2014   Depressive disorder, not elsewhere classified 07/03/2013   TIA (transient ischemic attack) 08/21/2012   Nail fungus 03/08/2012   At risk for falls 06/15/2011   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   Cancer of breast ([place]) 01/02/2011   Carotid atherosclerosis 01/02/2011   Chronic hoarseness 01/02/2011   Esophageal reflux 01/02/2011   PCP:  [name], FNP Pharmacy:   [id number] [place], [place] [id number]: [phone]: [phone]    Social Determinants of Health (SDOH) Interventions    Readmission Risk Interventions     No data to display
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
## 2021-11-01 DIAGNOSIS — G9341 Metabolic encephalopathy: Secondary | ICD-10-CM | POA: Diagnosis not present

## 2021-11-01 DIAGNOSIS — N3000 Acute cystitis without hematuria: Secondary | ICD-10-CM | POA: Diagnosis not present

## 2021-11-01 DIAGNOSIS — F01518 Vascular dementia, unspecified severity, with other behavioral disturbance: Secondary | ICD-10-CM | POA: Diagnosis not present

## 2021-11-01 LAB — CBC WITH DIFFERENTIAL/PLATELET
Abs Immature Granulocytes: 0.02 10*3/uL (ref 0.00–0.07)
Basophils Absolute: 0.1 10*3/uL (ref 0.0–0.1)
Basophils Relative: 1 %
Eosinophils Absolute: 0.3 10*3/uL (ref 0.0–0.5)
Eosinophils Relative: 4 %
HCT: 32.1 % — ABNORMAL LOW (ref 36.0–46.0)
Hemoglobin: 10.8 g/dL — ABNORMAL LOW (ref 12.0–15.0)
Immature Granulocytes: 0 %
Lymphocytes Relative: 11 %
Lymphs Abs: 0.8 10*3/uL (ref 0.7–4.0)
MCH: 31.3 pg (ref 26.0–34.0)
MCHC: 33.6 g/dL (ref 30.0–36.0)
MCV: 93 fL (ref 80.0–100.0)
Monocytes Absolute: 0.7 10*3/uL (ref 0.1–1.0)
Monocytes Relative: 9 %
Neutro Abs: 5.4 10*3/uL (ref 1.7–7.7)
Neutrophils Relative %: 75 %
Platelets: 158 10*3/uL (ref 150–400)
RBC: 3.45 MIL/uL — ABNORMAL LOW (ref 3.87–5.11)
RDW: 12.7 % (ref 11.5–15.5)
WBC: 7.2 10*3/uL (ref 4.0–10.5)
nRBC: 0 % (ref 0.0–0.2)

## 2021-11-01 LAB — BASIC METABOLIC PANEL
Anion gap: 4 — ABNORMAL LOW (ref 5–15)
BUN: 10 mg/dL (ref 8–23)
CO2: 25 mmol/L (ref 22–32)
Calcium: 9 mg/dL (ref 8.9–10.3)
Chloride: 110 mmol/L (ref 98–111)
Creatinine, Ser: 0.82 mg/dL (ref 0.44–1.00)
GFR, Estimated: >60 mL/min (ref >60)
Glucose, Bld: 106 mg/dL — ABNORMAL HIGH (ref 70–99)
Potassium: 3.8 mmol/L (ref 3.5–5.1)
Sodium: 139 mmol/L (ref 135–145)

## 2021-11-01 NOTE — TOC Progression Note (Signed)
Transition of Care [facility]) - Progression Note    Patient Details  Name: Natalie [name] MRN: [id number] Date of Birth: [birth year]  Transition of Care [facility]) CM/SW [place], RN Phone Number: 11/01/2021, 4:36 PM  Clinical Narrative:   Family chooses [facility] ALF[facility]Memory care in [place] for transfer. Phone [phone],   Family would like to transfer Friday or Monday if able.  They are hesitant to take patient home prior to transfer due to recent events.  RNCM left message for Janett Billow at facility for transfer details [phone]    Expected Discharge Plan:  (TBD) Barriers to Discharge: Continued Medical Work up  Expected Discharge Plan and Services Expected Discharge Plan:  (TBD)     Post Acute Care Choice:  (TBD, possible ALF/memory care)                                         Social Determinants of Health (SDOH) Interventions    Readmission Risk Interventions     No data to display
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
## 2021-11-01 NOTE — Progress Notes (Signed)
Patient pulled IV out and refused to have another placed.

## 2021-11-01 NOTE — Plan of Care (Signed)
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
## 2021-11-02 DIAGNOSIS — G9341 Metabolic encephalopathy: Secondary | ICD-10-CM | POA: Diagnosis not present

## 2021-11-02 DIAGNOSIS — N3000 Acute cystitis without hematuria: Secondary | ICD-10-CM | POA: Diagnosis not present

## 2021-11-02 DIAGNOSIS — F01518 Vascular dementia, unspecified severity, with other behavioral disturbance: Secondary | ICD-10-CM | POA: Diagnosis not present

## 2021-11-02 MED ORDER — LISINOPRIL 10 MG PO TABS
10.0000 mg | ORAL_TABLET | Freq: Every day | ORAL | Status: DC
  Administered 2021-11-03 – 2021-11-07 (×5): 10 mg via ORAL
  Filled 2021-11-02 (×5): qty 1

## 2021-11-02 MED ORDER — TUBERCULIN PPD 5 UNIT/0.1ML ID SOLN
5.0000 [IU] | Freq: Once | INTRADERMAL | Status: AC
Start: 2021-11-02 — End: 2021-11-04
  Administered 2021-11-02: 5 [IU] via INTRADERMAL
  Filled 2021-11-02: qty 0.1

## 2021-11-02 NOTE — TOC Progression Note (Addendum)
Transition of Care [facility]) - Progression Note    Patient Details  Name: Royce Sciara MRN: [id number] Date of Birth: 1929-08-07  Transition of Care [facility]) CM/SW Contact  Laurena Slimmer, RN Phone Number: 11/02/2021, 11:13 AM  Clinical Narrative:    Damaris Schooner with Janett Billow in admissions at [facility] ALF/Memory Care @ [phone]. Janett Billow stated discharge is pending a TB test in any form be complete (stateed this is a state requirement), an onsite assessment, and completed forms and furniture moved by family. The nurse will come today from [facility] later this evening to conduct an onsite assessment. Facility anticipates discharge no earlier than Friday at the earliest but preferably Monday. MD and nurse notified.       Expected Discharge Plan:  (TBD) Barriers to Discharge: Continued Medical Work up  Expected Discharge Plan and Services Expected Discharge Plan:  (TBD)     Post Acute Care Choice:  (TBD, possible ALF/memory care)                                         Social Determinants of Health (SDOH) Interventions    Readmission Risk Interventions     No data to display

## 2021-11-02 NOTE — Progress Notes (Signed)
PT Cancellation Note  Patient Details Name: Natalie [name] MRN: [id number] DOB: 1930/01/26   Cancelled Treatment:    Reason Eval/Treat Not Completed: Other (comment).  Chart reviewed and attempted ot see pt.  Pt currently resting and when awakened says she is too fatigued to participate at this time.  Will re-attempt at later date as medically appropriate.   Christie Nottingham 11/02/2021, 4:58 PM

## 2021-11-02 NOTE — Plan of Care (Signed)
  Problem: Clinical Measurements: Goal: Ability to maintain clinical measurements within normal limits will improve Outcome: Progressing   Problem: Clinical Measurements: Goal: Will remain free from infection Outcome: Progressing   Problem: Clinical Measurements: Goal: Diagnostic test results will improve Outcome: Progressing   

## 2021-11-02 NOTE — Progress Notes (Signed)
TB test administered to right forearm. Site circled. Will need to be read Friday 8/25.

## 2021-11-02 NOTE — Plan of Care (Signed)
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
## 2021-11-03 ENCOUNTER — Inpatient Hospital Stay: Payer: Medicare Other

## 2021-11-03 DIAGNOSIS — N3000 Acute cystitis without hematuria: Secondary | ICD-10-CM | POA: Diagnosis not present

## 2021-11-03 DIAGNOSIS — G9341 Metabolic encephalopathy: Secondary | ICD-10-CM | POA: Diagnosis not present

## 2021-11-03 MED ORDER — SENNOSIDES-DOCUSATE SODIUM 8.6-50 MG PO TABS
1.0000 | ORAL_TABLET | Freq: Two times a day (BID) | ORAL | Status: DC
  Administered 2021-11-03 – 2021-11-07 (×9): 1 via ORAL
  Filled 2021-11-03 (×9): qty 1
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
## 2021-11-03 NOTE — Consult Note (Addendum)
   [facility] Inpatient Consult   11/03/2021  Daytona Hedman [facility] 1929-09-28 [id number]   [place] Organization [ACO] Patient: [facility] Liaison coverage, remote review for high risk for unplanned readmission list for patient at [facility]   Primary Care Provider: [name], FNP, with [place] [facility], is an Independent Embedded provider with a Care Management team and program and is listed for the [facility] follow up needs     Patient screened for 5 length of stay hospitalization with noted high risk score for unplanned readmission risk.   Reviewed to assess for potential [place] Management service needs for post hospital transition for readmission prevention needs.  Review of patient's medical record reveals patient is for a memory care unit, reviewed PT/OT and inpatient [facility] team notes for disposition needs.     Plan:  Continue to follow progress and disposition to assess for post hospital care management needs with the Embedded team, if applicable.  Currently, disposition for an ALF/Memory Care is noted.   For questions contact:    Natividad Brood, RN BSN [facility] Liaison  [phone] business mobile phone Toll free office [phone]  Fax number: [phone] [email or web address]
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
## 2021-11-03 NOTE — Plan of Care (Signed)

## 2021-11-04 ENCOUNTER — Telehealth: Payer: Self-pay

## 2021-11-04 DIAGNOSIS — N3000 Acute cystitis without hematuria: Secondary | ICD-10-CM | POA: Diagnosis not present

## 2021-11-04 DIAGNOSIS — G9341 Metabolic encephalopathy: Secondary | ICD-10-CM | POA: Diagnosis not present

## 2021-11-04 LAB — CREATININE, SERUM
Creatinine, Ser: 1.01 mg/dL — ABNORMAL HIGH (ref 0.44–1.00)
GFR, Estimated: 53 mL/min — ABNORMAL LOW (ref >60)

## 2021-11-04 LAB — CULTURE, BLOOD (ROUTINE X 2)
Culture: NO GROWTH
Culture: NO GROWTH
Special Requests: ADEQUATE

## 2021-11-04 NOTE — Telephone Encounter (Signed)
Spoke with Natalie [name] this morning just doing a well check on Natalie [name]. Natalie [name] stated that she was on her way to facility to do paperwork for mom and that Mom was better than before and she think it was attributed to the UTI. Mom is not having the angry episodes at the present times.
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
## 2021-11-04 NOTE — NC FL2 (Deleted)
[place] LEVEL OF CARE SCREENING TOOL     IDENTIFICATION  Patient Name: Natalie [name] Birthdate: 1930/03/13 Sex: female Admission Date (Current Location): 10/28/2021  [facility] and [place] Number:      Facility and Address:  [facility], [address] [place], [place] [address]      Provider Number: [id number]  Attending Physician Name and Address:  S[place] Ace, MD  Relative Name and Phone Number:  Sybil Newby,[phone]    Current Level of Care: Hospital Recommended Level of Care: [place] Prior Approval Number:    Date Approved/Denied:   PASRR Number: [id number]  Discharge Plan:  (ALF)    Current Diagnoses: Patient Active Problem List   Diagnosis Date Noted   Vascular dementia of acute onset with behavioral disturbance ([place]) 11/94/1740   Acute metabolic encephalopathy 81/44/8185   UTI (urinary tract infection) 10/28/2021   Pancreatic cyst 10/19/2021   Adrenal hyperplasia ([place]) 10/19/2021   Osteopenia 09/21/2021   Leg swelling 09/21/2021   Aortic atherosclerosis ([place]) 09/21/2021   Chronic diastolic CHF (congestive heart failure) ([place]) 08/24/2021   Accidental fall 08/23/2021   Closed fracture of multiple pubic rami, left, initial encounter ([place]) 08/23/2021   ABLA (acute blood loss anemia) 08/23/2021   Pelvic hematoma, traumatic 08/23/2021   Pernicious anemia 07/22/2021   Bronchitis 07/18/2021   Headache 06/15/2021   CKD (chronic kidney disease) 10/12/2020   Breast mass, right 10/21/2019   Wound of right ankle 07/17/2019   Dysuria 03/28/2019   Carotid artery stenosis 02/28/2019   Carotid stenosis, symptomatic w/o infarct, left 10/17/2018   Chest wall pain 09/20/2018   Encounter for medical examination to establish care 02/27/2018   Hearing loss 02/27/2018   Screening for osteoporosis 02/27/2018   Depression, major, single episode, mild ([place]) 02/27/2018   HTN (hypertension), malignant  02/27/2018   Memory changes 02/27/2018   COPD (chronic obstructive pulmonary disease) ([place]) 02/27/2018   HLD (hyperlipidemia) 02/27/2018   History of TIA (transient ischemic attack) 02/27/2018   Prediabetes 11/17/2015   Seroma 12/18/2014   Personal history of malignant neoplasm of breast 08/22/2014   History of fracture of right hip 05/26/2014   Peripheral vascular disease ([place]) 05/26/2014   Depressive disorder, not elsewhere classified 07/03/2013   TIA (transient ischemic attack) 08/21/2012   Nail fungus 03/08/2012   At risk for falls 06/15/2011   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   Cancer of breast ([place]) 01/02/2011   Carotid atherosclerosis 01/02/2011   Chronic hoarseness 01/02/2011   Esophageal reflux 01/02/2011    Orientation RESPIRATION BLADDER Height & Weight     Self, Time  Normal Continent Weight: 59 kg Height:  '5\' 5"'$  (165.1 cm)  BEHAVIORAL SYMPTOMS/MOOD NEUROLOGICAL BOWEL NUTRITION STATUS     (n/a) Continent Diet  AMBULATORY STATUS COMMUNICATION OF NEEDS Skin     Verbally Normal, Other (Comment) (Eccyhmosis bilaterally)                       Personal Care Assistance Level of Assistance  Bathing, Dressing Bathing Assistance: Limited assistance   Dressing Assistance: Limited assistance     Functional Limitations Info  Hearing, Sight Sight Info: Impaired Hearing Info: Impaired      SPECIAL CARE FACTORS FREQUENCY  PT (By licensed PT), OT (By licensed OT)     PT Frequency: Min 2x weekly OT Frequency: Min 2x weekly            Contractures Contractures Info: Not present  Additional Factors Info  Code Status, Allergies Code Status Info: FULL Allergies Info: Influenza Vaccines           Current Medications (11/04/2021):  This is the current hospital active medication list Current Facility-Administered Medications  Medication Dose Route Frequency Provider Last Rate Last Admin   acetaminophen (TYLENOL) tablet 650 mg  650 mg Oral Q6H PRN Elwyn Reach, MD   650 mg at 11/02/21 1824   Or   acetaminophen (TYLENOL) suppository 650 mg  650 mg Rectal Q6H PRN Elwyn Reach, MD       amLODipine (NORVASC) tablet 10 mg  10 mg Oral Daily Merlyn Lot, MD   10 mg at 11/04/21 0840   aspirin EC tablet 81 mg  81 mg Oral Daily Swayze, Ava, DO   81 mg at 11/04/21 0840   atorvastatin (LIPITOR) tablet 40 mg  40 mg Oral Daily Gala Romney L, MD   40 mg at 11/04/21 0840   divalproex (DEPAKOTE) DR tablet 125 mg  125 mg Oral Q12H Patrecia Pour, NP   125 mg at 11/04/21 0840   enoxaparin (LOVENOX) injection 40 mg  40 mg Subcutaneous Q24H Gala Romney L, MD   40 mg at 11/04/21 0843   ipratropium-albuterol (DUONEB) 0.5-2.5 (3) MG/3ML nebulizer solution 3 mL  3 mL Nebulization Q6H PRN Merlyn Lot, MD       lisinopril (ZESTRIL) tablet 10 mg  10 mg Oral Daily Priscella Mann, Sudheer B, MD   10 mg at 11/04/21 0840   memantine (NAMENDA) tablet 5 mg  5 mg Oral BID Merlyn Lot, MD   5 mg at 11/04/21 0840   metoprolol tartrate (LOPRESSOR) tablet 25 mg  25 mg Oral BID Merlyn Lot, MD   25 mg at 11/04/21 0840   ondansetron (ZOFRAN) tablet 4 mg  4 mg Oral Q6H PRN Elwyn Reach, MD       Or   ondansetron (ZOFRAN) injection 4 mg  4 mg Intravenous Q6H PRN Elwyn Reach, MD       polyethylene glycol (MIRALAX / GLYCOLAX) packet 17 g  17 g Oral Daily Swayze, Ava, DO   17 g at 11/04/21 0840   risperiDONE (RISPERDAL) tablet 0.25 mg  0.25 mg Oral Daily PRN Patrecia Pour, NP       senna-docusate (Senokot-S) tablet 1 tablet  1 tablet Oral BID Ralene Muskrat B, MD   1 tablet at 11/04/21 0840   tiotropium [facility]) inhalation capsule ([facility] use ONLY) 18 mcg  1 capsule Inhalation Daily Swayze, Ava, DO   18 mcg at 11/04/21 0842   traZODone (DESYREL) tablet 50 mg  50 mg Oral QHS Merlyn Lot, MD   50 mg at 11/03/21 2028     Discharge Medications: Please see discharge summary for a list of discharge medications.  Relevant Imaging  Results:  Relevant Lab Results:   Additional Information [id number]  Laurena Slimmer, RN

## 2021-11-04 NOTE — Progress Notes (Signed)
PPD placed 8/23  NEGATIVE test  Interpreted by MD 8/25  Ralene Muskrat MD  No charge

## 2021-11-04 NOTE — Progress Notes (Signed)
Physical Therapy Treatment Patient Details Name: Natalie [name] MRN: [id number] DOB: [birth year] Today's Date: 11/04/2021   History of Present Illness Natalie [name] is a 88 y.o. female with medical history significant for Dementia hypertension, COPD, prior TIA, history of breast cancer, and recent minimally displaced L superior and inferior pubic rami fracture. Pt brought to ER due to AMS. Family reporting pt not taking medications, becoming violent and threatening family.    PT Comments    Pt was pleasant and motivated to participate during the session and put forth good effort throughout. Pt required only min extra time and effort with bed mobility tasks and was able to transfer and ambulate with good control and stability.  Pt reported no adverse symptoms during the session with SpO2 and HR WNL on room air.  Pt will benefit from 24/7 supervision at her memory care unit upon discharge.       Recommendations for follow up therapy are one component of a multi-disciplinary discharge planning process, led by the attending physician.  Recommendations may be updated based on patient status, additional functional criteria and insurance authorization.  Follow Up Recommendations  Other (comment): Return to ALF memory care with 24/7 supervision      Assistance Recommended at Discharge Frequent or constant Supervision/Assistance  Patient can return home with the following A little help with walking and/or transfers;A little help with bathing/dressing/bathroom;Assistance with cooking/housework;Direct supervision/assist for medications management;Direct supervision/assist for financial management;Assist for transportation   Equipment Recommendations  None recommended by PT;Other (comment) (Per chart review pt has a RW)    Recommendations for Other Services       Precautions / Restrictions Precautions Precautions: Fall Restrictions Weight Bearing Restrictions: No     Mobility  Bed  Mobility Overal bed mobility: Modified Independent             General bed mobility comments: increased time/effort only    Transfers Overall transfer level: Needs assistance Equipment used: Rolling walker (2 wheels) Transfers: Sit to/from Stand Sit to Stand: Supervision           General transfer comment: Min cues for general sequencing and proper positioning prior to sitting    Ambulation/Gait Ambulation/Gait assistance: Supervision Gait Distance (Feet): 160 Feet Assistive device: Standard walker Gait Pattern/deviations: Step-through pattern, Decreased step length - right, Decreased step length - left, Trunk flexed Gait velocity: decreased     General Gait Details: Slow cadence but steady without LOB   Stairs             Wheelchair Mobility    Modified Rankin (Stroke Patients Only)       Balance Overall balance assessment: Needs assistance Sitting-balance support: No upper extremity supported, Feet supported Sitting balance-Leahy Scale: Good     Standing balance support: During functional activity, Bilateral upper extremity supported, Reliant on assistive device for balance Standing balance-Leahy Scale: Fair                              Cognition Arousal/Alertness: Awake/alert Behavior During Therapy: WFL for tasks assessed/performed Overall Cognitive Status: History of cognitive impairments - at baseline                                          Exercises      General Comments        Pertinent Vitals/Pain  Pain Assessment Pain Assessment: Faces Pain Score: 1  Faces Pain Scale: Hurts a little bit Pain Location: pelvis Pain Descriptors / Indicators: Sore Pain Intervention(s): Monitored during session, Repositioned    Home Living                          Prior Function            PT Goals (current goals can now be found in the care plan section) Progress towards PT goals: Progressing toward  goals    Frequency    Min 2X/week      PT Plan Current plan remains appropriate    Co-evaluation              AM-PAC PT "6 Clicks" Mobility   Outcome Measure  Help needed turning from your back to your side while in a flat bed without using bedrails?: None Help needed moving from lying on your back to sitting on the side of a flat bed without using bedrails?: None Help needed moving to and from a bed to a chair (including a wheelchair)?: A Little Help needed standing up from a chair using your arms (e.g., wheelchair or bedside chair)?: A Little Help needed to walk in hospital room?: A Little Help needed climbing 3-5 steps with a railing? : A Little 6 Click Score: 20    End of Session Equipment Utilized During Treatment: Gait belt Activity Tolerance: Patient tolerated treatment well Patient left: in bed;with call bell/phone within reach;with bed alarm set Nurse Communication: Mobility status PT Visit Diagnosis: Unsteadiness on feet (R26.81);Other abnormalities of gait and mobility (R26.89);Muscle weakness (generalized) (M62.81);Difficulty in walking, not elsewhere classified (R26.2)     Time: 4098-1191 PT Time Calculation (min) (ACUTE ONLY): 19 min  Charges:  $Gait Training: 8-22 mins                     D. Scott Deem Marmol PT, DPT 11/04/21, 12:41 PM

## 2021-11-04 NOTE — TOC Progression Note (Addendum)
Transition of Care [facility]) - Progression Note    Patient Details  Name: Natalie [name] MRN: [id number] Date of Birth: 01-Nov-1929  Transition of Care [facility]) CM/SW Contact  Laurena Slimmer, RN Phone Number: 11/04/2021, 3:59 PM  Clinical Narrative:    Damaris Schooner with [facility] in  admissions. Facility is requesting FL2 and TB results be emailed to [email or web address]. Advised medications listed on FL2 and discharge medications may vary. Discharge likely 8/28.   Spoke with nurse from facility. Nurse inquired about lovenox injections. Advised medication on the FL2 will vary due to inpatient status.   MD verified lovenox would be changed at discharge. Returned call and left message for nurse to inform of change at discharge       Expected Discharge Plan:  (TBD) Barriers to Discharge: Continued Medical Work up  Expected Discharge Plan and Services Expected Discharge Plan:  (TBD)     Post Acute Care Choice:  (TBD, possible ALF/memory care)                                         Social Determinants of Health (SDOH) Interventions    Readmission Risk Interventions     No data to display

## 2021-11-04 NOTE — NC FL2 (Signed)
[place] LEVEL OF CARE SCREENING TOOL     IDENTIFICATION  Patient Name: Natalie [name] Birthdate: 03-04-1930 Sex: female Admission Date (Current Location): 10/28/2021  [facility] and [place] Number:      Facility and Address:  [facility], [address] [place], [place] [address]      Provider Number: [id number]  Attending Physician Name and Address:  S[place]ney Ace, MD  Relative Name and Phone Number:  Sybil [phone]    Current Level of Care: Hospital Recommended Level of Care: [place] Prior Approval Number:    Date Approved/Denied:   PASRR Number: [id number]  Discharge Plan:  (ALF)    Current Diagnoses: Patient Active Problem List   Diagnosis Date Noted   Vascular dementia of acute onset with behavioral disturbance ([place]) 03/15/7251   Acute metabolic encephalopathy 66/44/0347   UTI (urinary tract infection) 10/28/2021   Pancreatic cyst 10/19/2021   Adrenal hyperplasia ([place]) 10/19/2021   Osteopenia 09/21/2021   Leg swelling 09/21/2021   Aortic atherosclerosis ([place]) 09/21/2021   Chronic diastolic CHF (congestive heart failure) ([place]) 08/24/2021   Accidental fall 08/23/2021   Closed fracture of multiple pubic rami, left, initial encounter ([place]) 08/23/2021   ABLA (acute blood loss anemia) 08/23/2021   Pelvic hematoma, traumatic 08/23/2021   Pernicious anemia 07/22/2021   Bronchitis 07/18/2021   Headache 06/15/2021   CKD (chronic kidney disease) 10/12/2020   Breast mass, right 10/21/2019   Wound of right ankle 07/17/2019   Dysuria 03/28/2019   Carotid artery stenosis 02/28/2019   Carotid stenosis, symptomatic w/o infarct, left 10/17/2018   Chest wall pain 09/20/2018   Encounter for medical examination to establish care 02/27/2018   Hearing loss 02/27/2018   Screening for osteoporosis 02/27/2018   Depression, major, single episode, mild ([place]) 02/27/2018   HTN (hypertension), malignant  02/27/2018   Memory changes 02/27/2018   COPD (chronic obstructive pulmonary disease) ([place]) 02/27/2018   HLD (hyperlipidemia) 02/27/2018   History of TIA (transient ischemic attack) 02/27/2018   Prediabetes 11/17/2015   Seroma 12/18/2014   Personal history of malignant neoplasm of breast 08/22/2014   History of fracture of right hip 05/26/2014   Peripheral vascular disease ([place]) 05/26/2014   Depressive disorder, not elsewhere classified 07/03/2013   TIA (transient ischemic attack) 08/21/2012   Nail fungus 03/08/2012   At risk for falls 06/15/2011   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   Cancer of breast ([place]) 01/02/2011   Carotid atherosclerosis 01/02/2011   Chronic hoarseness 01/02/2011   Esophageal reflux 01/02/2011    Orientation RESPIRATION BLADDER Height & Weight     Self, Time  Normal Continent Weight: 59 kg Height:  '5\' 5"'$  (165.1 cm)  BEHAVIORAL SYMPTOMS/MOOD NEUROLOGICAL BOWEL NUTRITION STATUS     (n/a) Continent Diet  AMBULATORY STATUS COMMUNICATION OF NEEDS Skin     Verbally Normal, Other (Comment) (Eccyhmosis bilaterally)                       Personal Care Assistance Level of Assistance  Bathing, Dressing Bathing Assistance: Limited assistance   Dressing Assistance: Limited assistance     Functional Limitations Info  Hearing, Sight Sight Info: Impaired Hearing Info: Impaired      SPECIAL CARE FACTORS FREQUENCY  PT (By licensed PT), OT (By licensed OT)     PT Frequency: Min 2x weekly OT Frequency: Min 2x weekly            Contractures Contractures Info: Not present  Additional Factors Info  Code Status, Allergies Code Status Info: FULL Allergies Info: Influenza Vaccines           Current Medications (11/04/2021):  This is the current hospital active medication list Current Facility-Administered Medications  Medication Dose Route Frequency Provider Last Rate Last Admin   acetaminophen (TYLENOL) tablet 650 mg  650 mg Oral Q6H PRN Elwyn Reach, MD   650 mg at 11/02/21 1824   Or   acetaminophen (TYLENOL) suppository 650 mg  650 mg Rectal Q6H PRN Elwyn Reach, MD       amLODipine (NORVASC) tablet 10 mg  10 mg Oral Daily Merlyn Lot, MD   10 mg at 11/04/21 0840   aspirin EC tablet 81 mg  81 mg Oral Daily Swayze, Ava, DO   81 mg at 11/04/21 0840   atorvastatin (LIPITOR) tablet 40 mg  40 mg Oral Daily Gala Romney L, MD   40 mg at 11/04/21 0840   divalproex (DEPAKOTE) DR tablet 125 mg  125 mg Oral Q12H Patrecia Pour, NP   125 mg at 11/04/21 0840   enoxaparin (LOVENOX) injection 40 mg  40 mg Subcutaneous Q24H Gala Romney L, MD   40 mg at 11/04/21 0843   ipratropium-albuterol (DUONEB) 0.5-2.5 (3) MG/3ML nebulizer solution 3 mL  3 mL Nebulization Q6H PRN Merlyn Lot, MD       lisinopril (ZESTRIL) tablet 10 mg  10 mg Oral Daily Priscella Mann, Sudheer B, MD   10 mg at 11/04/21 0840   memantine (NAMENDA) tablet 5 mg  5 mg Oral BID Merlyn Lot, MD   5 mg at 11/04/21 0840   metoprolol tartrate (LOPRESSOR) tablet 25 mg  25 mg Oral BID Merlyn Lot, MD   25 mg at 11/04/21 0840   ondansetron (ZOFRAN) tablet 4 mg  4 mg Oral Q6H PRN Elwyn Reach, MD       Or   ondansetron (ZOFRAN) injection 4 mg  4 mg Intravenous Q6H PRN Elwyn Reach, MD       polyethylene glycol (MIRALAX / GLYCOLAX) packet 17 g  17 g Oral Daily Swayze, Ava, DO   17 g at 11/04/21 0840   risperiDONE (RISPERDAL) tablet 0.25 mg  0.25 mg Oral Daily PRN Patrecia Pour, NP       senna-docusate (Senokot-S) tablet 1 tablet  1 tablet Oral BID Ralene Muskrat B, MD   1 tablet at 11/04/21 0840   tiotropium [facility]) inhalation capsule ([facility] use ONLY) 18 mcg  1 capsule Inhalation Daily Swayze, Ava, DO   18 mcg at 11/04/21 0842   traZODone (DESYREL) tablet 50 mg  50 mg Oral QHS Merlyn Lot, MD   50 mg at 11/03/21 2028     Discharge Medications: Please see discharge summary for a list of discharge medications.  Relevant Imaging  Results:  Relevant Lab Results:   Additional Information [phone]  Laurena Slimmer, RN

## 2021-11-05 DIAGNOSIS — N3000 Acute cystitis without hematuria: Secondary | ICD-10-CM | POA: Diagnosis not present
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
## 2021-11-06 DIAGNOSIS — N3000 Acute cystitis without hematuria: Secondary | ICD-10-CM | POA: Diagnosis not present

## 2021-11-06 NOTE — Progress Notes (Signed)
Pt refused to have VS take. Pt is not experiencing any acute distress.
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
## 2021-11-07 ENCOUNTER — Inpatient Hospital Stay: Payer: Medicare Other

## 2021-11-07 DIAGNOSIS — N3000 Acute cystitis without hematuria: Secondary | ICD-10-CM | POA: Diagnosis not present

## 2021-11-07 DIAGNOSIS — G9341 Metabolic encephalopathy: Secondary | ICD-10-CM | POA: Diagnosis not present

## 2021-11-07 MED ORDER — SENNOSIDES-DOCUSATE SODIUM 8.6-50 MG PO TABS: 1.0000 | ORAL_TABLET | Freq: Two times a day (BID) | ORAL | 0 refills | Status: AC | PRN

## 2021-11-07 MED ORDER — DIVALPROEX SODIUM 125 MG PO DR TAB: 125.0000 mg | DELAYED_RELEASE_TABLET | Freq: Two times a day (BID) | ORAL | 0 refills | Status: AC

## 2021-11-07 NOTE — Progress Notes (Signed)
Mobility Specialist - Progress Note    11/07/21 1100  Mobility  Activity Ambulated with assistance in room;Stood at bedside;Dangled on edge of bed  Level of Assistance Standby assist, set-up cues, supervision of patient - no hands on  Assistive Device Front wheel walker  Distance Ambulated (ft) 20 ft  Activity Response Tolerated well  $Mobility charge 1 Mobility      Pt is sitting EOB upon arrival. Heavy VC's throughout. Completes all activities with supervision and returns EOB to prepare for d/c.  Merrily Brittle Mobility Specialist 11/07/21, 11:53 AM

## 2021-11-07 NOTE — NC FL2 (Signed)
[place] LEVEL OF CARE SCREENING TOOL     IDENTIFICATION  Patient Name: Natalie [name] Birthdate: 1929/11/15 Sex: female Admission Date (Current Location): 10/28/2021  [facility] and [place] Number:      Facility and Address:  [facility], [address] [place], [place] [address]      Provider Number: [id number]  Attending Physician Name and Address:  Sidney Ace, MD  Relative Name and Phone Number:  Sybil Huyett,[phone]    Current Level of Care: Hospital Recommended Level of Care: [place] Prior Approval Number:    Date Approved/Denied:   PASRR Number: [id number]  Discharge Plan:  (ALF)    Current Diagnoses: Patient Active Problem List   Diagnosis Date Noted   Vascular dementia of acute onset with behavioral disturbance ([place]) 85/27/7824   Acute metabolic encephalopathy 23/53/6144   UTI (urinary tract infection) 10/28/2021   Pancreatic cyst 10/19/2021   Adrenal hyperplasia ([place]) 10/19/2021   Osteopenia 09/21/2021   Leg swelling 09/21/2021   Aortic atherosclerosis ([place]) 09/21/2021   Chronic diastolic CHF (congestive heart failure) ([place]) 08/24/2021   Accidental fall 08/23/2021   Closed fracture of multiple pubic rami, left, initial encounter ([place]) 08/23/2021   ABLA (acute blood loss anemia) 08/23/2021   Pelvic hematoma, traumatic 08/23/2021   Pernicious anemia 07/22/2021   Bronchitis 07/18/2021   Headache 06/15/2021   CKD (chronic kidney disease) 10/12/2020   Breast mass, right 10/21/2019   Wound of right ankle 07/17/2019   Dysuria 03/28/2019   Carotid artery stenosis 02/28/2019   Carotid stenosis, symptomatic w/o infarct, left 10/17/2018   Chest wall pain 09/20/2018   Encounter for medical examination to establish care 02/27/2018   Hearing loss 02/27/2018   Screening for osteoporosis 02/27/2018   Depression, major, single episode, mild ([place]) 02/27/2018   HTN (hypertension), malignant  02/27/2018   Memory changes 02/27/2018   COPD (chronic obstructive pulmonary disease) ([place]) 02/27/2018   HLD (hyperlipidemia) 02/27/2018   History of TIA (transient ischemic attack) 02/27/2018   Prediabetes 11/17/2015   Seroma 12/18/2014   Personal history of malignant neoplasm of breast 08/22/2014   History of fracture of right hip 05/26/2014   Peripheral vascular disease ([place]) 05/26/2014   Depressive disorder, not elsewhere classified 07/03/2013   TIA (transient ischemic attack) 08/21/2012   Nail fungus 03/08/2012   At risk for falls 06/15/2011   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   Cancer of breast ([place]) 01/02/2011   Carotid atherosclerosis 01/02/2011   Chronic hoarseness 01/02/2011   Esophageal reflux 01/02/2011    Orientation RESPIRATION BLADDER Height & Weight     Self, Time  Normal Continent Weight: 59 kg Height:  '5\' 5"'$  (165.1 cm)  BEHAVIORAL SYMPTOMS/MOOD NEUROLOGICAL BOWEL NUTRITION STATUS     (n/a) Continent Diet  AMBULATORY STATUS COMMUNICATION OF NEEDS Skin     Verbally Normal, Other (Comment) (Eccyhmosis bilaterally)                       Personal Care Assistance Level of Assistance  Bathing, Dressing Bathing Assistance: Limited assistance   Dressing Assistance: Limited assistance     Functional Limitations Info  Hearing, Sight Sight Info: Impaired Hearing Info: Impaired      SPECIAL CARE FACTORS FREQUENCY  PT (By licensed PT), OT (By licensed OT)     PT Frequency: Min 2x weekly OT Frequency: Min 2x weekly            Contractures Contractures Info: Not present  Additional Factors Info  Code Status, Allergies Code Status Info: FULL Allergies Info: Influenza Vaccines           Current Medications (11/07/2021):   TAKE these medications     amLODipine 10 MG tablet Commonly known as: NORVASC Take 1 tablet (10 mg total) by mouth daily.    aspirin EC 81 MG tablet Take 81 mg by mouth daily. In preparation for surgery 12/18/ After  stopping plavix    atorvastatin 40 MG tablet Commonly known as: LIPITOR TAKE 1 TABLET BY MOUTH EVERY DAY    Calcium Citrate-Vitamin D 250-200 MG-UNIT Tabs Take 250 mg by mouth every morning.    cholecalciferol 25 MCG (1000 UNIT) tablet Commonly known as: VITAMIN D3 Take 1,000 Units by mouth daily.    cyanocobalamin 1000 MCG/ML injection Commonly known as: VITAMIN B12 Inject 1,000 mcg into the muscle every 30 (thirty) days.    divalproex 125 MG DR tablet Commonly known as: DEPAKOTE Take 1 tablet (125 mg total) by mouth every 12 (twelve) hours.    ipratropium-albuterol 0.5-2.5 (3) MG/3ML Soln Commonly known as: DUONEB Take 3 mLs by nebulization 4 (four) times daily as needed.    lisinopril 5 MG tablet Commonly known as: ZESTRIL TAKE 1 TABLET BY MOUTH EVERY DAY    memantine 5 MG tablet Commonly known as: NAMENDA Take 1 tablet (5 mg total) by mouth 2 (two) times daily.    metoprolol tartrate 50 MG tablet Commonly known as: LOPRESSOR TAKE 1/2 TABLET BY MOUTH TWICE A DAY    senna-docusate 8.6-50 MG tablet Commonly known as: Senokot-S Take 1 tablet by mouth 2 (two) times daily as needed for mild constipation.    Tiotropium Bromide Monohydrate 2.5 MCG/ACT Aers Inhale 2 puffs into the lungs daily.    traZODone 50 MG tablet Commonly known as: DESYREL TAKE 1/2 TABLETS (25 MG) BY MOUTH AT BEDTIME.       Discharge Medications: Please see discharge summary for a list of discharge medications.  Relevant Imaging Results:  Relevant Lab Results:   Additional Information [phone]  Laurena Slimmer, RN

## 2021-11-07 NOTE — Care Management Important Message (Signed)
Important Message  Patient Details  Name: Natalie [name] MRN: [id number] Date of Birth: [birth year]   Medicare Important Message Given:  Yes     Dannette Barbara 11/07/2021, 12:24 PM

## 2021-11-07 NOTE — Progress Notes (Signed)
Occupational Therapy Treatment Patient Details Name: Natalie [name] MRN: [id number] DOB: 1930/03/06 Today's Date: 11/07/2021   History of present illness Natalie [name] is a 88 y.o. female with medical history significant for Dementia hypertension, COPD, prior TIA, history of breast cancer, and recent minimally displaced L superior and inferior pubic rami fracture. Pt brought to ER due to AMS. Family reporting pt not taking medications, becoming violent and threatening family.   OT comments  Pt seen for OT tx this date. Pt pleasant, endorses small headache and no other complaints. Pt completed bed mobility, donned shoes seated EOB (after set up), ambulated w RW to bathroom, completed pericare in sitting, and hand washing standing at sink all with supervision. VC for safety, thoroughness with hand cleansing. Pt set up for donning underwear which she completed with supervision including standing to complete donning over hips. Pt left with nurse tech to prepare for discharge. Pt progressing well towards goals. Demonstrating ability to complete ADL/mobility with supervision and PRN VC for safety but does not require cues for sequencing ADL tasks.    Recommendations for follow up therapy are one component of a multi-disciplinary discharge planning process, led by the attending physician.  Recommendations may be updated based on patient status, additional functional criteria and insurance authorization.    Follow Up Recommendations  Other (comment) (Recommend higher level of care at discharge with follow up therapies as appropriate to maximize safety/indep with ADL/mobility.)    Assistance Recommended at Discharge Frequent or constant Supervision/Assistance  Patient can return home with the following  A little help with walking and/or transfers;Assistance with cooking/housework;Assist for transportation;Direct supervision/assist for medications management;Direct supervision/assist for financial  management;Help with stairs or ramp for entrance;A little help with bathing/dressing/bathroom   Equipment Recommendations  None recommended by OT    Recommendations for Other Services      Precautions / Restrictions Precautions Precautions: Fall Restrictions Weight Bearing Restrictions: No       Mobility Bed Mobility Overal bed mobility: Needs Assistance Bed Mobility: Supine to Sit     Supine to sit: Supervision          Transfers Overall transfer level: Needs assistance Equipment used: Rolling walker (2 wheels) Transfers: Sit to/from Stand Sit to Stand: Supervision                 Balance Overall balance assessment: Needs assistance Sitting-balance support: No upper extremity supported, Feet supported Sitting balance-Leahy Scale: Good     Standing balance support: During functional activity, No upper extremity supported Standing balance-Leahy Scale: Fair                             ADL either performed or assessed with clinical judgement   ADL                                         General ADL Comments: Pt donned shoes seated EOB w set up and supervision. Pt completed toilet transfer and pericare with supervision and [place] for safety and 1 VC for thoroughness with hand cleaning afterwards while standing at sink to wash her hands. Pt set up with underwear and was able to don with SBA for trasnfer to complete donning over hips in standing.    Extremity/Trunk Assessment              Vision  Perception     Praxis      Cognition Arousal/Alertness: Awake/alert Behavior During Therapy: WFL for tasks assessed/performed Overall Cognitive Status: History of cognitive impairments - at baseline                                 General Comments: pleasant, oriented to self, follows commands with PRN cues        Exercises      Shoulder Instructions       General Comments      Pertinent Vitals/  Pain       Pain Assessment Pain Assessment: Faces Faces Pain Scale: No hurt Pain Location: pt reports small headache Pain Intervention(s): Monitored during session  Home Living                                          Prior Functioning/Environment              Frequency  Min 2X/week        Progress Toward Goals  OT Goals(current goals can now be found in the care plan section)  Progress towards OT goals: Progressing toward goals  Acute Rehab OT Goals Patient Stated Goal: get better and go to facility OT Goal Formulation: With patient Time For Goal Achievement: 11/15/21 Potential to Achieve Goals: Good  Plan Discharge plan remains appropriate;Frequency remains appropriate    Co-evaluation                 AM-PAC OT "6 Clicks" Daily Activity     Outcome Measure   Help from another person eating meals?: None Help from another person taking care of personal grooming?: A Little Help from another person toileting, which includes using toliet, bedpan, or urinal?: A Little Help from another person bathing (including washing, rinsing, drying)?: A Little Help from another person to put on and taking off regular upper body clothing?: A Little Help from another person to put on and taking off regular lower body clothing?: A Little 6 Click Score: 19    End of Session Equipment Utilized During Treatment: Rolling walker (2 wheels)  OT Visit Diagnosis: Other abnormalities of gait and mobility (R26.89);Muscle weakness (generalized) (M62.81);Other symptoms and signs involving cognitive function   Activity Tolerance Patient tolerated treatment well   Patient Left in bed;with call bell/phone within reach;Other (comment) (seated EOB with NT to prepare for discharge)   Nurse Communication          Time: 4174-0814 OT Time Calculation (min): 12 min  Charges: OT General Charges $OT Visit: 1 Visit OT Treatments $Self Care/Home Management : 8-22  mins  Ardeth Perfect., MPH, MS, OTR/L ascom [phone] 11/07/21, 10:33 AM

## 2021-11-07 NOTE — TOC Transition Note (Addendum)
Transition of Care [facility]) - CM/SW Discharge Note   Patient Details  Name: Natalie [name] MRN: [id number] Date of Birth: 02/13/1930  [facility] CM/SW Contact:  Laurena Slimmer, RN Phone Number: 11/07/2021, 12:31 PM   Clinical Narrative:    10:08am Spoke with Quintella Baton at ALF to inform of discharge today. Janett Billow stated she was waiting on the [facility] and TB test results. Advised Janett Billow the updated FL2 was pending the discharge summary and MD signature. Advised the TB test results were sent Friday as requested. Janett Billow stated she did not receive results. Advised this CM sent the results in the same email as the previous FL2 the facility nurse called and had questions about. CM will resend the TB test results and updated FL2.   10:30am Spoke with patient and family in the room to advised of discharge. Patient daughter will transport patient to her "Apartment."  12:36pm Updated FL2 and TB test results emailed to facility @ [email or web address]. Called Quintella Baton at [phone] to advised email was sent and to confirm email was received. No answer. Left message requesting email confirmation. Nurse notified everything had been sent. Provided facility phone number for report.  2:02pm Spoke with Olen Cordial from the facility to confirm all needed forms had been received and reviewed. No additional information is needed. Room number could not be provided however Olen Cordial stated patient is cleared to come to facility. [facility] nurse notified. TOC siging off.  .   Final next level of care:  (TBD) Barriers to Discharge: Continued Medical Work up   Patient Goals and CMS Choice     Choice offered to / list presented to : Adult Children  Discharge Placement                       Discharge Plan and Services     Post Acute Care Choice:  (TBD, possible ALF/memory care)                               Social Determinants of Health (SDOH) Interventions      Readmission Risk Interventions     No data to display
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
## 2021-11-08 ENCOUNTER — Telehealth: Payer: Self-pay

## 2021-11-08 NOTE — Significant Event (Addendum)
Received a concern call from patient's daughter on 8/29 at 1630 p.m.  Apparently the assisted living facility that the patient was discharged to is telling the daughter that she needs to pay for 24/7 care due to documentation of agitation and potentially violent behavior prior to the patient coming to the emergency room.  I was not the admitting physician nor the ED physician and have no way of confirming or denying the events that were documented in the history and physical.  However on my time with the patient she was calm, pleasant, not agitated and in no way violent.  In regards to the patient's antipsychotic regimen she was started on scheduled Depakote and as needed risperidone.  She was not requiring risperidone.  This had been discontinued at time of discharge.  In my clinical opinion the patient does not need Risperidone and does not need 24/7 care.  All of the information on her medical documentation was provided to the assisted living facility prior to the patient being discharged.  At no point was any information withheld from the assisted living facility.  Ralene Muskrat MD  No charge

## 2021-11-08 NOTE — Telephone Encounter (Signed)
Transition Care Management Unsuccessful Follow-up Telephone Call  Date of discharge and from where:  11/07/21 [facility]  Attempts:  1st Attempt  Reason for unsuccessful TCM follow-up call:  No answer/busy. Will follow.

## 2021-11-09 ENCOUNTER — Ambulatory Visit: Payer: Medicare Other | Admitting: Gastroenterology

## 2021-11-09 NOTE — Telephone Encounter (Signed)
Transition Care Management Follow-up Telephone Call Date of discharge and from where: 11/07/21 [facility] Spoke with daughter and she reports patient is transferring care from [place] to [facility]. Family will contact [place] to confirm transfer and as needed. Hospital follow up not scheduled at this time per family.

## 2021-11-10 DIAGNOSIS — F01518 Vascular dementia, unspecified severity, with other behavioral disturbance: Secondary | ICD-10-CM | POA: Diagnosis not present

## 2021-11-10 DIAGNOSIS — I110 Hypertensive heart disease with heart failure: Secondary | ICD-10-CM | POA: Diagnosis not present

## 2021-11-10 DIAGNOSIS — J449 Chronic obstructive pulmonary disease, unspecified: Secondary | ICD-10-CM | POA: Diagnosis not present

## 2021-11-10 DIAGNOSIS — E559 Vitamin D deficiency, unspecified: Secondary | ICD-10-CM | POA: Diagnosis not present

## 2021-11-11 ENCOUNTER — Ambulatory Visit: Payer: Medicare Other | Admitting: Cardiology

## 2021-11-21 DIAGNOSIS — F01518 Vascular dementia, unspecified severity, with other behavioral disturbance: Secondary | ICD-10-CM | POA: Diagnosis not present

## 2021-11-21 DIAGNOSIS — I69898 Other sequelae of other cerebrovascular disease: Secondary | ICD-10-CM | POA: Diagnosis not present

## 2021-11-22 DIAGNOSIS — Z887 Allergy status to serum and vaccine status: Secondary | ICD-10-CM | POA: Diagnosis not present

## 2021-11-22 DIAGNOSIS — F0390 Unspecified dementia without behavioral disturbance: Secondary | ICD-10-CM | POA: Diagnosis not present

## 2021-11-22 DIAGNOSIS — E559 Vitamin D deficiency, unspecified: Secondary | ICD-10-CM | POA: Diagnosis not present

## 2021-11-22 DIAGNOSIS — I959 Hypotension, unspecified: Secondary | ICD-10-CM | POA: Diagnosis not present

## 2021-11-22 DIAGNOSIS — F01518 Vascular dementia, unspecified severity, with other behavioral disturbance: Secondary | ICD-10-CM | POA: Diagnosis not present

## 2021-11-22 DIAGNOSIS — I872 Venous insufficiency (chronic) (peripheral): Secondary | ICD-10-CM | POA: Diagnosis not present

## 2021-11-22 DIAGNOSIS — D519 Vitamin B12 deficiency anemia, unspecified: Secondary | ICD-10-CM | POA: Diagnosis not present

## 2021-11-22 DIAGNOSIS — M7989 Other specified soft tissue disorders: Secondary | ICD-10-CM | POA: Diagnosis not present

## 2021-11-22 DIAGNOSIS — Z87891 Personal history of nicotine dependence: Secondary | ICD-10-CM | POA: Diagnosis not present

## 2021-11-22 DIAGNOSIS — Z8673 Personal history of transient ischemic attack (TIA), and cerebral infarction without residual deficits: Secondary | ICD-10-CM | POA: Diagnosis not present

## 2021-11-22 DIAGNOSIS — E785 Hyperlipidemia, unspecified: Secondary | ICD-10-CM | POA: Diagnosis not present

## 2021-11-22 DIAGNOSIS — Z853 Personal history of malignant neoplasm of breast: Secondary | ICD-10-CM | POA: Diagnosis not present

## 2021-11-22 DIAGNOSIS — R601 Generalized edema: Secondary | ICD-10-CM | POA: Diagnosis not present

## 2021-11-22 DIAGNOSIS — I10 Essential (primary) hypertension: Secondary | ICD-10-CM | POA: Diagnosis not present

## 2021-11-22 DIAGNOSIS — R609 Edema, unspecified: Secondary | ICD-10-CM | POA: Diagnosis not present

## 2021-11-22 DIAGNOSIS — R0602 Shortness of breath: Secondary | ICD-10-CM | POA: Diagnosis not present

## 2021-11-22 DIAGNOSIS — R600 Localized edema: Secondary | ICD-10-CM | POA: Diagnosis not present

## 2021-11-22 DIAGNOSIS — I878 Other specified disorders of veins: Secondary | ICD-10-CM | POA: Diagnosis not present

## 2021-11-24 DIAGNOSIS — R54 Age-related physical debility: Secondary | ICD-10-CM | POA: Diagnosis not present

## 2021-11-24 DIAGNOSIS — I872 Venous insufficiency (chronic) (peripheral): Secondary | ICD-10-CM | POA: Diagnosis not present

## 2021-11-24 DIAGNOSIS — R600 Localized edema: Secondary | ICD-10-CM | POA: Diagnosis not present

## 2021-11-24 DIAGNOSIS — I110 Hypertensive heart disease with heart failure: Secondary | ICD-10-CM | POA: Diagnosis not present

## 2021-11-29 DIAGNOSIS — S32502D Unspecified fracture of left pubis, subsequent encounter for fracture with routine healing: Secondary | ICD-10-CM | POA: Diagnosis not present

## 2021-11-29 DIAGNOSIS — K5909 Other constipation: Secondary | ICD-10-CM | POA: Diagnosis not present

## 2021-11-29 DIAGNOSIS — F0280 Dementia in other diseases classified elsewhere without behavioral disturbance: Secondary | ICD-10-CM | POA: Diagnosis not present

## 2021-11-29 DIAGNOSIS — F0150 Vascular dementia without behavioral disturbance: Secondary | ICD-10-CM | POA: Diagnosis not present

## 2021-11-29 DIAGNOSIS — I5032 Chronic diastolic (congestive) heart failure: Secondary | ICD-10-CM | POA: Diagnosis not present

## 2021-11-29 DIAGNOSIS — I130 Hypertensive heart and chronic kidney disease with heart failure and stage 1 through stage 4 chronic kidney disease, or unspecified chronic kidney disease: Secondary | ICD-10-CM | POA: Diagnosis not present

## 2021-11-29 DIAGNOSIS — J449 Chronic obstructive pulmonary disease, unspecified: Secondary | ICD-10-CM | POA: Diagnosis not present

## 2021-11-29 DIAGNOSIS — E785 Hyperlipidemia, unspecified: Secondary | ICD-10-CM | POA: Diagnosis not present

## 2021-11-29 DIAGNOSIS — N189 Chronic kidney disease, unspecified: Secondary | ICD-10-CM | POA: Diagnosis not present

## 2021-11-29 DIAGNOSIS — E538 Deficiency of other specified B group vitamins: Secondary | ICD-10-CM | POA: Diagnosis not present

## 2021-11-29 DIAGNOSIS — K219 Gastro-esophageal reflux disease without esophagitis: Secondary | ICD-10-CM | POA: Diagnosis not present

## 2021-11-29 DIAGNOSIS — Z7982 Long term (current) use of aspirin: Secondary | ICD-10-CM | POA: Diagnosis not present

## 2021-11-29 DIAGNOSIS — E559 Vitamin D deficiency, unspecified: Secondary | ICD-10-CM | POA: Diagnosis not present

## 2021-11-29 DIAGNOSIS — D62 Acute posthemorrhagic anemia: Secondary | ICD-10-CM | POA: Diagnosis not present

## 2021-11-29 DIAGNOSIS — M1990 Unspecified osteoarthritis, unspecified site: Secondary | ICD-10-CM | POA: Diagnosis not present

## 2021-11-29 DIAGNOSIS — I872 Venous insufficiency (chronic) (peripheral): Secondary | ICD-10-CM | POA: Diagnosis not present

## 2021-12-05 DIAGNOSIS — F5105 Insomnia due to other mental disorder: Secondary | ICD-10-CM | POA: Diagnosis not present

## 2021-12-05 DIAGNOSIS — I69898 Other sequelae of other cerebrovascular disease: Secondary | ICD-10-CM | POA: Diagnosis not present

## 2021-12-05 DIAGNOSIS — F0153 Vascular dementia, unspecified severity, with mood disturbance: Secondary | ICD-10-CM | POA: Diagnosis not present

## 2021-12-08 DIAGNOSIS — R600 Localized edema: Secondary | ICD-10-CM | POA: Diagnosis not present

## 2021-12-08 DIAGNOSIS — I110 Hypertensive heart disease with heart failure: Secondary | ICD-10-CM | POA: Diagnosis not present

## 2021-12-08 DIAGNOSIS — I872 Venous insufficiency (chronic) (peripheral): Secondary | ICD-10-CM | POA: Diagnosis not present

## 2021-12-19 DIAGNOSIS — F0153 Vascular dementia, unspecified severity, with mood disturbance: Secondary | ICD-10-CM | POA: Diagnosis not present

## 2021-12-19 DIAGNOSIS — F5105 Insomnia due to other mental disorder: Secondary | ICD-10-CM | POA: Diagnosis not present

## 2021-12-19 DIAGNOSIS — I69898 Other sequelae of other cerebrovascular disease: Secondary | ICD-10-CM | POA: Diagnosis not present

## 2021-12-21 ENCOUNTER — Telehealth: Payer: Self-pay | Admitting: Family

## 2021-12-21 NOTE — Telephone Encounter (Signed)
Spoke with patient's daughter [name] she stated her mom is in a facility.

## 2022-01-03 DIAGNOSIS — N189 Chronic kidney disease, unspecified: Secondary | ICD-10-CM | POA: Diagnosis not present

## 2022-01-03 DIAGNOSIS — I130 Hypertensive heart and chronic kidney disease with heart failure and stage 1 through stage 4 chronic kidney disease, or unspecified chronic kidney disease: Secondary | ICD-10-CM | POA: Diagnosis not present

## 2022-01-03 DIAGNOSIS — I5032 Chronic diastolic (congestive) heart failure: Secondary | ICD-10-CM | POA: Diagnosis not present

## 2022-01-10 DIAGNOSIS — I130 Hypertensive heart and chronic kidney disease with heart failure and stage 1 through stage 4 chronic kidney disease, or unspecified chronic kidney disease: Secondary | ICD-10-CM | POA: Diagnosis not present

## 2022-01-10 DIAGNOSIS — I872 Venous insufficiency (chronic) (peripheral): Secondary | ICD-10-CM | POA: Diagnosis not present

## 2022-01-10 DIAGNOSIS — D519 Vitamin B12 deficiency anemia, unspecified: Secondary | ICD-10-CM | POA: Diagnosis not present

## 2022-01-10 DIAGNOSIS — R600 Localized edema: Secondary | ICD-10-CM | POA: Diagnosis not present

## 2022-01-13 ENCOUNTER — Other Ambulatory Visit (INDEPENDENT_AMBULATORY_CARE_PROVIDER_SITE_OTHER): Payer: Self-pay | Admitting: Nurse Practitioner

## 2022-01-13 ENCOUNTER — Telehealth (INDEPENDENT_AMBULATORY_CARE_PROVIDER_SITE_OTHER): Payer: Self-pay | Admitting: Vascular Surgery

## 2022-01-13 DIAGNOSIS — I739 Peripheral vascular disease, unspecified: Secondary | ICD-10-CM

## 2022-01-13 DIAGNOSIS — I6523 Occlusion and stenosis of bilateral carotid arteries: Secondary | ICD-10-CM

## 2022-01-13 NOTE — Telephone Encounter (Signed)
Daughter of patient left 2 x messages wanting a referral to a Vascular Surgeon in [place] for the patient. After returning call, daughter states that she is not happy with care in the memory care facility that pt is in. States that she is having leg swelling, blistering and bruising. I advised that the Vascular Surgeons that I saw in [place] were all stating out of network for pt. I advised that they could do one of two things. Either call insurance and find an in network provider and call me back with details and I would send referral there or they could have the pt's PCP (MD on staff at memory care facility) send in a referral. Pt's daughter acknowledged and I will await her response. I did advise that we are moving next week and would be closed Mon-Wed but returning to open on Thursday 11.9.23.

## 2022-01-14 ENCOUNTER — Other Ambulatory Visit: Payer: Self-pay | Admitting: Family

## 2022-01-14 DIAGNOSIS — I10 Essential (primary) hypertension: Secondary | ICD-10-CM

## 2022-01-16 DIAGNOSIS — I69898 Other sequelae of other cerebrovascular disease: Secondary | ICD-10-CM | POA: Diagnosis not present

## 2022-01-16 DIAGNOSIS — F5105 Insomnia due to other mental disorder: Secondary | ICD-10-CM | POA: Diagnosis not present

## 2022-01-16 DIAGNOSIS — F0153 Vascular dementia, unspecified severity, with mood disturbance: Secondary | ICD-10-CM | POA: Diagnosis not present

## 2022-01-23 ENCOUNTER — Ambulatory Visit: Payer: Medicare Other | Admitting: Family

## 2022-01-24 DIAGNOSIS — R296 Repeated falls: Secondary | ICD-10-CM | POA: Diagnosis not present

## 2022-01-24 DIAGNOSIS — M6281 Muscle weakness (generalized): Secondary | ICD-10-CM | POA: Diagnosis not present

## 2022-01-24 DIAGNOSIS — R54 Age-related physical debility: Secondary | ICD-10-CM | POA: Diagnosis not present

## 2022-01-24 DIAGNOSIS — F0153 Vascular dementia, unspecified severity, with mood disturbance: Secondary | ICD-10-CM | POA: Diagnosis not present

## 2022-02-01 DIAGNOSIS — N189 Chronic kidney disease, unspecified: Secondary | ICD-10-CM | POA: Diagnosis not present

## 2022-02-01 DIAGNOSIS — I130 Hypertensive heart and chronic kidney disease with heart failure and stage 1 through stage 4 chronic kidney disease, or unspecified chronic kidney disease: Secondary | ICD-10-CM | POA: Diagnosis not present

## 2022-02-01 DIAGNOSIS — I5032 Chronic diastolic (congestive) heart failure: Secondary | ICD-10-CM | POA: Diagnosis not present

## 2022-02-09 DIAGNOSIS — E785 Hyperlipidemia, unspecified: Secondary | ICD-10-CM | POA: Diagnosis not present

## 2022-02-09 DIAGNOSIS — R0902 Hypoxemia: Secondary | ICD-10-CM | POA: Diagnosis not present

## 2022-02-09 DIAGNOSIS — Z887 Allergy status to serum and vaccine status: Secondary | ICD-10-CM | POA: Diagnosis not present

## 2022-02-09 DIAGNOSIS — S0990XA Unspecified injury of head, initial encounter: Secondary | ICD-10-CM | POA: Diagnosis not present

## 2022-02-09 DIAGNOSIS — W19XXXA Unspecified fall, initial encounter: Secondary | ICD-10-CM | POA: Diagnosis not present

## 2022-02-09 DIAGNOSIS — I10 Essential (primary) hypertension: Secondary | ICD-10-CM | POA: Diagnosis not present

## 2022-02-09 DIAGNOSIS — S0003XA Contusion of scalp, initial encounter: Secondary | ICD-10-CM | POA: Diagnosis not present

## 2022-02-09 DIAGNOSIS — Z79899 Other long term (current) drug therapy: Secondary | ICD-10-CM | POA: Diagnosis not present

## 2022-02-09 DIAGNOSIS — Z87891 Personal history of nicotine dependence: Secondary | ICD-10-CM | POA: Diagnosis not present

## 2022-02-09 DIAGNOSIS — Z7982 Long term (current) use of aspirin: Secondary | ICD-10-CM | POA: Diagnosis not present

## 2022-02-13 ENCOUNTER — Ambulatory Visit: Payer: Medicare Other | Admitting: Physician Assistant

## 2022-02-13 DIAGNOSIS — F01518 Vascular dementia, unspecified severity, with other behavioral disturbance: Secondary | ICD-10-CM | POA: Diagnosis not present

## 2022-02-13 DIAGNOSIS — F5105 Insomnia due to other mental disorder: Secondary | ICD-10-CM | POA: Diagnosis not present

## 2022-02-13 DIAGNOSIS — I69898 Other sequelae of other cerebrovascular disease: Secondary | ICD-10-CM | POA: Diagnosis not present

## 2022-02-15 DIAGNOSIS — I739 Peripheral vascular disease, unspecified: Secondary | ICD-10-CM | POA: Diagnosis not present

## 2022-02-16 DIAGNOSIS — I872 Venous insufficiency (chronic) (peripheral): Secondary | ICD-10-CM | POA: Diagnosis not present

## 2022-02-16 DIAGNOSIS — R600 Localized edema: Secondary | ICD-10-CM | POA: Diagnosis not present

## 2022-02-16 DIAGNOSIS — I130 Hypertensive heart and chronic kidney disease with heart failure and stage 1 through stage 4 chronic kidney disease, or unspecified chronic kidney disease: Secondary | ICD-10-CM | POA: Diagnosis not present

## 2022-03-12 DIAGNOSIS — R296 Repeated falls: Secondary | ICD-10-CM | POA: Diagnosis not present

## 2022-03-12 DIAGNOSIS — R0902 Hypoxemia: Secondary | ICD-10-CM | POA: Diagnosis not present

## 2022-03-12 DIAGNOSIS — R55 Syncope and collapse: Secondary | ICD-10-CM | POA: Diagnosis not present

## 2022-03-12 DIAGNOSIS — S4991XA Unspecified injury of right shoulder and upper arm, initial encounter: Secondary | ICD-10-CM | POA: Diagnosis not present

## 2022-03-12 DIAGNOSIS — I080 Rheumatic disorders of both mitral and aortic valves: Secondary | ICD-10-CM | POA: Diagnosis not present

## 2022-03-12 DIAGNOSIS — J811 Chronic pulmonary edema: Secondary | ICD-10-CM | POA: Diagnosis not present

## 2022-03-12 DIAGNOSIS — R4182 Altered mental status, unspecified: Secondary | ICD-10-CM | POA: Diagnosis not present

## 2022-03-12 DIAGNOSIS — S6991XA Unspecified injury of right wrist, hand and finger(s), initial encounter: Secondary | ICD-10-CM | POA: Diagnosis not present

## 2022-03-12 DIAGNOSIS — R918 Other nonspecific abnormal finding of lung field: Secondary | ICD-10-CM | POA: Diagnosis not present

## 2022-03-12 DIAGNOSIS — F0390 Unspecified dementia without behavioral disturbance: Secondary | ICD-10-CM | POA: Diagnosis not present

## 2022-03-12 DIAGNOSIS — S59901A Unspecified injury of right elbow, initial encounter: Secondary | ICD-10-CM | POA: Diagnosis not present

## 2022-03-12 DIAGNOSIS — G9340 Encephalopathy, unspecified: Secondary | ICD-10-CM | POA: Diagnosis not present

## 2022-03-12 DIAGNOSIS — Z515 Encounter for palliative care: Secondary | ICD-10-CM | POA: Diagnosis not present

## 2022-03-12 DIAGNOSIS — R609 Edema, unspecified: Secondary | ICD-10-CM | POA: Diagnosis not present

## 2022-03-12 DIAGNOSIS — S0003XA Contusion of scalp, initial encounter: Secondary | ICD-10-CM | POA: Diagnosis not present

## 2022-03-12 DIAGNOSIS — I6782 Cerebral ischemia: Secondary | ICD-10-CM | POA: Diagnosis not present

## 2022-03-12 DIAGNOSIS — W19XXXA Unspecified fall, initial encounter: Secondary | ICD-10-CM | POA: Diagnosis not present

## 2022-03-12 DIAGNOSIS — S0990XA Unspecified injury of head, initial encounter: Secondary | ICD-10-CM | POA: Diagnosis not present

## 2022-03-12 DIAGNOSIS — I3139 Other pericardial effusion (noninflammatory): Secondary | ICD-10-CM | POA: Diagnosis not present

## 2022-03-12 DIAGNOSIS — R9082 White matter disease, unspecified: Secondary | ICD-10-CM | POA: Diagnosis not present

## 2022-03-12 DIAGNOSIS — J90 Pleural effusion, not elsewhere classified: Secondary | ICD-10-CM | POA: Diagnosis not present

## 2022-03-12 DIAGNOSIS — I10 Essential (primary) hypertension: Secondary | ICD-10-CM | POA: Diagnosis not present

## 2022-03-12 DIAGNOSIS — W19XXXS Unspecified fall, sequela: Secondary | ICD-10-CM | POA: Diagnosis not present

## 2022-03-12 DIAGNOSIS — Z96641 Presence of right artificial hip joint: Secondary | ICD-10-CM | POA: Diagnosis not present

## 2022-03-29 DIAGNOSIS — W19XXXS Unspecified fall, sequela: Secondary | ICD-10-CM | POA: Diagnosis not present

## 2022-03-29 DIAGNOSIS — F0390 Unspecified dementia without behavioral disturbance: Secondary | ICD-10-CM | POA: Diagnosis not present

## 2022-03-29 DIAGNOSIS — I10 Essential (primary) hypertension: Secondary | ICD-10-CM | POA: Diagnosis not present

## 2022-03-29 DIAGNOSIS — R296 Repeated falls: Secondary | ICD-10-CM | POA: Diagnosis not present

## 2022-03-29 DIAGNOSIS — K567 Ileus, unspecified: Secondary | ICD-10-CM | POA: Diagnosis not present

## 2022-03-29 DIAGNOSIS — E1121 Type 2 diabetes mellitus with diabetic nephropathy: Secondary | ICD-10-CM | POA: Diagnosis not present

## 2022-03-29 DIAGNOSIS — Z515 Encounter for palliative care: Secondary | ICD-10-CM | POA: Diagnosis not present

## 2022-03-29 DIAGNOSIS — J449 Chronic obstructive pulmonary disease, unspecified: Secondary | ICD-10-CM | POA: Diagnosis not present

## 2022-03-29 DIAGNOSIS — R109 Unspecified abdominal pain: Secondary | ICD-10-CM | POA: Diagnosis not present

## 2022-03-29 DIAGNOSIS — E785 Hyperlipidemia, unspecified: Secondary | ICD-10-CM | POA: Diagnosis not present

## 2022-03-29 DIAGNOSIS — I509 Heart failure, unspecified: Secondary | ICD-10-CM | POA: Diagnosis not present

## 2022-03-29 DIAGNOSIS — J984 Other disorders of lung: Secondary | ICD-10-CM | POA: Diagnosis not present

## 2022-03-29 DIAGNOSIS — G9340 Encephalopathy, unspecified: Secondary | ICD-10-CM | POA: Diagnosis not present

## 2022-03-29 DIAGNOSIS — E559 Vitamin D deficiency, unspecified: Secondary | ICD-10-CM | POA: Diagnosis not present

## 2022-03-31 DIAGNOSIS — E785 Hyperlipidemia, unspecified: Secondary | ICD-10-CM | POA: Diagnosis not present

## 2022-03-31 DIAGNOSIS — G9340 Encephalopathy, unspecified: Secondary | ICD-10-CM | POA: Diagnosis not present

## 2022-03-31 DIAGNOSIS — I10 Essential (primary) hypertension: Secondary | ICD-10-CM | POA: Diagnosis not present

## 2022-03-31 DIAGNOSIS — R296 Repeated falls: Secondary | ICD-10-CM | POA: Diagnosis not present

## 2022-04-03 DIAGNOSIS — G9340 Encephalopathy, unspecified: Secondary | ICD-10-CM | POA: Diagnosis not present

## 2022-04-03 DIAGNOSIS — R296 Repeated falls: Secondary | ICD-10-CM | POA: Diagnosis not present

## 2022-04-03 DIAGNOSIS — I10 Essential (primary) hypertension: Secondary | ICD-10-CM | POA: Diagnosis not present

## 2022-04-03 DIAGNOSIS — J449 Chronic obstructive pulmonary disease, unspecified: Secondary | ICD-10-CM | POA: Diagnosis not present

## 2022-04-05 DIAGNOSIS — I10 Essential (primary) hypertension: Secondary | ICD-10-CM | POA: Diagnosis not present

## 2022-04-05 DIAGNOSIS — R296 Repeated falls: Secondary | ICD-10-CM | POA: Diagnosis not present

## 2022-04-05 DIAGNOSIS — E785 Hyperlipidemia, unspecified: Secondary | ICD-10-CM | POA: Diagnosis not present

## 2022-04-05 DIAGNOSIS — G9340 Encephalopathy, unspecified: Secondary | ICD-10-CM | POA: Diagnosis not present

## 2022-04-07 DIAGNOSIS — R296 Repeated falls: Secondary | ICD-10-CM | POA: Diagnosis not present

## 2022-04-07 DIAGNOSIS — I10 Essential (primary) hypertension: Secondary | ICD-10-CM | POA: Diagnosis not present

## 2022-04-07 DIAGNOSIS — G9340 Encephalopathy, unspecified: Secondary | ICD-10-CM | POA: Diagnosis not present

## 2022-04-07 DIAGNOSIS — E785 Hyperlipidemia, unspecified: Secondary | ICD-10-CM | POA: Diagnosis not present

## 2022-04-10 DIAGNOSIS — I10 Essential (primary) hypertension: Secondary | ICD-10-CM | POA: Diagnosis not present

## 2022-04-10 DIAGNOSIS — G9340 Encephalopathy, unspecified: Secondary | ICD-10-CM | POA: Diagnosis not present

## 2022-04-10 DIAGNOSIS — R296 Repeated falls: Secondary | ICD-10-CM | POA: Diagnosis not present

## 2022-04-10 DIAGNOSIS — E785 Hyperlipidemia, unspecified: Secondary | ICD-10-CM | POA: Diagnosis not present

## 2022-05-09 ENCOUNTER — Telehealth: Payer: Self-pay | Admitting: Family

## 2022-05-09 NOTE — Telephone Encounter (Signed)
Natalie [name] to schedule their annual wellness visit. Appointment made for 05/22/2022.  Thank you,  [name] Direct dial  [phone]

## 2022-05-10 DIAGNOSIS — I10 Essential (primary) hypertension: Secondary | ICD-10-CM | POA: Diagnosis not present

## 2022-05-10 DIAGNOSIS — F0390 Unspecified dementia without behavioral disturbance: Secondary | ICD-10-CM | POA: Diagnosis not present

## 2022-05-10 DIAGNOSIS — J449 Chronic obstructive pulmonary disease, unspecified: Secondary | ICD-10-CM | POA: Diagnosis not present

## 2022-05-22 ENCOUNTER — Ambulatory Visit (INDEPENDENT_AMBULATORY_CARE_PROVIDER_SITE_OTHER): Payer: Medicare Other

## 2022-05-22 VITALS — Ht 65.0 in | Wt 130.0 lb

## 2022-05-22 DIAGNOSIS — Z0000 Encounter for general adult medical examination without abnormal findings: Secondary | ICD-10-CM | POA: Diagnosis not present

## 2022-05-22 NOTE — Patient Instructions (Addendum)
Natalie [name] , Thank you for taking time to come for your Medicare Wellness Visit. I appreciate your ongoing commitment to your health goals. Please review the following plan we discussed and let me know if I can assist you in the future.   These are the goals we discussed:  Goals      Follow up with Primary Care Provider     As needed.        This is a list of the screening recommended for you and due dates:  Health Maintenance  Topic Date Due   COVID-19 Vaccine (4 - 2023-24 season) 06/07/2022*   Zoster (Shingles) Vaccine (2 of 2) 08/22/2022*   Medicare Annual Wellness Visit  05/22/2023   DTaP/Tdap/Td vaccine (2 - Td or Tdap) 10/07/2028   Pneumonia Vaccine  Completed   DEXA scan (bone density measurement)  Completed   HPV Vaccine  Aged Out   Flu Shot  Discontinued  *Topic was postponed. The date shown is not the original due date.    Advanced directives: on file.  Conditions/risks identified: none new  Next appointment: Follow up in one year for your annual wellness visit.   Preventive Care 40-64 Years, Female Preventive care refers to lifestyle choices and visits with your health care provider that can promote health and wellness. What does preventive care include? A yearly physical exam. This is also called an annual well check. Dental exams once or twice a year. Routine eye exams. Ask your health care provider how often you should have your eyes checked. Personal lifestyle choices, including: Daily care of your teeth and gums. Regular physical activity. Eating a healthy diet. Avoiding tobacco and drug use. Limiting alcohol use. Practicing safe sex. Taking low-dose aspirin daily starting at age 5. Taking vitamin and mineral supplements as recommended by your health care provider. What happens during an annual well check? The services and screenings done by your health care provider during your annual well check will depend on your age, overall health, lifestyle risk  factors, and family history of disease. Counseling  Your health care provider may ask you questions about your: Alcohol use. Tobacco use. Drug use. Emotional well-being. Home and relationship well-being. Sexual activity. Eating habits. Work and work Statistician. Method of birth control. Menstrual cycle. Pregnancy history. Screening  You may have the following tests or measurements: Height, weight, and BMI. Blood pressure. Lipid and cholesterol levels. These may be checked every 5 years, or more frequently if you are over 45 years old. Skin check. Lung cancer screening. You may have this screening every year starting at age 52 if you have a 30-pack-year history of smoking and currently smoke or have quit within the past 15 years. Fecal occult blood test (FOBT) of the stool. You may have this test every year starting at age 68. Flexible sigmoidoscopy or colonoscopy. You may have a sigmoidoscopy every 5 years or a colonoscopy every 10 years starting at age 21. Hepatitis C blood test. Hepatitis B blood test. Sexually transmitted disease (STD) testing. Diabetes screening. This is done by checking your blood sugar (glucose) after you have not eaten for a while (fasting). You may have this done every 1-3 years. Mammogram. This may be done every 1-2 years. Talk to your health care provider about when you should start having regular mammograms. This may depend on whether you have a family history of breast cancer. BRCA-related cancer screening. This may be done if you have a family history of breast, ovarian, tubal, or peritoneal  cancers. Pelvic exam and Pap test. This may be done every 3 years starting at age 96. Starting at age 13, this may be done every 5 years if you have a Pap test in combination with an HPV test. Bone density scan. This is done to screen for osteoporosis. You may have this scan if you are at high risk for osteoporosis. Discuss your test results, treatment options, and if  necessary, the need for more tests with your health care provider. Vaccines  Your health care provider may recommend certain vaccines, such as: Influenza vaccine. This is recommended every year. Tetanus, diphtheria, and acellular pertussis (Tdap, Td) vaccine. You may need a Td booster every 10 years. Zoster vaccine. You may need this after age 54. Pneumococcal 13-valent conjugate (PCV13) vaccine. You may need this if you have certain conditions and were not previously vaccinated. Pneumococcal polysaccharide (PPSV23) vaccine. You may need one or two doses if you smoke cigarettes or if you have certain conditions. Talk to your health care provider about which screenings and vaccines you need and how often you need them. This information is not intended to replace advice given to you by your health care provider. Make sure you discuss any questions you have with your health care provider. Document Released: 03/26/2015 Document Revised: 11/17/2015 Document Reviewed: 12/29/2014 Elsevier Interactive Patient Education  2017 [place] Prevention in the Home Falls can cause injuries. They can happen to people of all ages. There are many things you can do to make your home safe and to help prevent falls. What can I do on the outside of my home? Regularly fix the edges of walkways and driveways and fix any cracks. Remove anything that might make you trip as you walk through a door, such as a raised step or threshold. Trim any bushes or trees on the path to your home. Use bright outdoor lighting. Clear any walking paths of anything that might make someone trip, such as rocks or tools. Regularly check to see if handrails are loose or broken. Make sure that both sides of any steps have handrails. Any raised decks and porches should have guardrails on the edges. Have any leaves, snow, or ice cleared regularly. Use sand or salt on walking paths during winter. Clean up any spills in your  garage right away. This includes oil or grease spills. What can I do in the bathroom? Use night lights. Install grab bars by the toilet and in the tub and shower. Do not use towel bars as grab bars. Use non-skid mats or decals in the tub or shower. If you need to sit down in the shower, use a plastic, non-slip stool. Keep the floor dry. Clean up any water that spills on the floor as soon as it happens. Remove soap buildup in the tub or shower regularly. Attach bath mats securely with double-sided non-slip rug tape. Do not have throw rugs and other things on the floor that can make you trip. What can I do in the bedroom? Use night lights. Make sure that you have a light by your bed that is easy to reach. Do not use any sheets or blankets that are too big for your bed. They should not hang down onto the floor. Have a firm chair that has side arms. You can use this for support while you get dressed. Do not have throw rugs and other things on the floor that can make you trip. What can I do in the  kitchen? Clean up any spills right away. Avoid walking on wet floors. Keep items that you use a lot in easy-to-reach places. If you need to reach something above you, use a strong step stool that has a grab bar. Keep electrical cords out of the way. Do not use floor polish or wax that makes floors slippery. If you must use wax, use non-skid floor wax. Do not have throw rugs and other things on the floor that can make you trip. What can I do with my stairs? Do not leave any items on the stairs. Make sure that there are handrails on both sides of the stairs and use them. Fix handrails that are broken or loose. Make sure that handrails are as long as the stairways. Check any carpeting to make sure that it is firmly attached to the stairs. Fix any carpet that is loose or worn. Avoid having throw rugs at the top or bottom of the stairs. If you do have throw rugs, attach them to the floor with carpet  tape. Make sure that you have a light switch at the top of the stairs and the bottom of the stairs. If you do not have them, ask someone to add them for you. What else can I do to help prevent falls? Wear shoes that: Do not have high heels. Have rubber bottoms. Are comfortable and fit you well. Are closed at the toe. Do not wear sandals. If you use a stepladder: Make sure that it is fully opened. Do not climb a closed stepladder. Make sure that both sides of the stepladder are locked into place. Ask someone to hold it for you, if possible. Clearly mark and make sure that you can see: Any grab bars or handrails. First and last steps. Where the edge of each step is. Use tools that help you move around (mobility aids) if they are needed. These include: Canes. Walkers. Scooters. Crutches. Turn on the lights when you go into a dark area. Replace any light bulbs as soon as they burn out. Set up your furniture so you have a clear path. Avoid moving your furniture around. If any of your floors are uneven, fix them. If there are any pets around you, be aware of where they are. Review your medicines with your doctor. Some medicines can make you feel dizzy. This can increase your chance of falling. Ask your doctor what other things that you can do to help prevent falls. This information is not intended to replace advice given to you by your health care provider. Make sure you discuss any questions you have with your health care provider. Document Released: 12/24/2008 Document Revised: 08/05/2015 Document Reviewed: 04/03/2014 Elsevier Interactive Patient Education  2017 [employer].

## 2022-05-22 NOTE — Progress Notes (Signed)
Subjective:   Natalie [name] is a 89 y.o. female who presents for Medicare Annual (Subsequent) preventive examination.  Review of Systems    No ROS.  Medicare Wellness Virtual Visit.  Visual/audio telehealth visit, UTA vital signs.   See social history for additional risk factors.   Cardiac Risk Factors include: advanced age (>21mn, >>59women)     Objective:    Today's Vitals   05/22/22 1232  Weight: 130 lb (59 kg)  Height: '5\' 5"'$  (1.651 m)   Body mass index is 21.63 kg/m.     05/22/2022   12:39 PM 10/31/2021    7:29 AM 10/28/2021    4:52 PM 08/23/2021    9:00 AM 08/22/2021    3:37 PM 08/05/2021    8:55 AM 07/06/2021    1:35 PM  Advanced Directives  Does Patient Have a Medical Advance Directive? Yes  No Yes Yes Yes Yes  Type of AParamed[place]of [place] will    [place] will    Does patient want to make changes to medical advance directive? No - Patient declined   No - Patient declined     Copy of [place] Chart? Yes - validated most recent copy scanned in chart (See row information)        Would patient like information on creating a medical advance directive?  No - Patient declined         Current Medications (verified) Outpatient Encounter Medications as of 05/22/2022  Medication Sig   amLODipine (NORVASC) 10 MG tablet Take 1 tablet (10 mg total) by mouth daily.   aspirin EC 81 MG tablet Take 81 mg by mouth daily. In preparation for surgery 12/18/ After stopping plavix   atorvastatin (LIPITOR) 40 MG tablet TAKE 1 TABLET BY MOUTH EVERY DAY   Calcium Citrate-Vitamin D 250-200 MG-UNIT TABS Take 250 mg by mouth every morning.   cholecalciferol (VITAMIN D3) 25 MCG (1000 UT) tablet Take 1,000 Units by mouth daily.    cyanocobalamin (,VITAMIN B-12,) 1000 MCG/ML injection Inject 1,000 mcg into the muscle every 30 (thirty) days.   divalproex (DEPAKOTE) 125 MG DR tablet Take 1 tablet (125 mg total) by  mouth every 12 (twelve) hours.   ipratropium-albuterol (DUONEB) 0.5-2.5 (3) MG/3ML SOLN Take 3 mLs by nebulization 4 (four) times daily as needed.   lisinopril (ZESTRIL) 5 MG tablet TAKE 1 TABLET BY MOUTH EVERY DAY   memantine (NAMENDA) 5 MG tablet Take 1 tablet (5 mg total) by mouth 2 (two) times daily.   metoprolol tartrate (LOPRESSOR) 50 MG tablet TAKE 1/2 TABLET TWICE A DAY BY MOUTH   Tiotropium Bromide Monohydrate 2.5 MCG/ACT AERS Inhale 2 puffs into the lungs daily.   traZODone (DESYREL) 50 MG tablet TAKE 1/2 TABLETS (25 MG) BY MOUTH AT BEDTIME.   No facility-administered encounter medications on file as of 05/22/2022.    Allergies (verified) Influenza vaccines   History: Past Medical History:  Diagnosis Date   Allergies    hay fever   Arthritis    Breast cancer, left ([place] 2015   Left total mastectomy   COPD [place]ch[place] obstructive [place] disease) [place]    GERD (gastroesophageal reflux disease)    Hyperlipemia    Hypertension    [place]roke [place] 2002   TIA   Past Surgical History:  Procedure Laterality Date   BREAST BIOPSY  2014   BREAST LUMPECTOMY  2014   left side   ENDARTERECTOMY Left 02/28/2019  Procedure: ENDARTERECTOMY CAROTID;  Surgeon: Katha Cabal, MD;  Location: [facility] ORS;  Service: Vascular;  Laterality: Left;   EVACUATION OF CERVICAL HEMATOMA Left 02/28/2019   Procedure: EVACUATION OF HEMATOMA;  Surgeon: Katha Cabal, MD;  Location: [facility] ORS;  Service: Vascular;  Laterality: Left;   EYE SURGERY Bilateral    cataract extractions   JOINT REPLACEMENT     MASTECTOMY Left May 9th 2017   RHINOPLASTY  1956   TONSILLECTOMY  1939   TOTAL HIP ARTHROPLASTY Right    head of the femur replaced due to fracture   Family History  Problem Relation Age of Onset   Breast cancer Mother    Lung cancer Father    Social History   Socioeconomic History   Marital status: Single    Spouse name: Not on file   Number of children: Not on file   Years of  education: Not on file   Highest education level: Not on file  Occupational History   Occupation: CRNA    Comment: retired  Tobacco Use   Smoking status: Former   Smokeless tobacco: Never   Tobacco comments:    20 years ago  Vaping Use   Vaping Use: Never used  Substance and Sexual Activity   Alcohol use: Never   Drug use: Never   Sexual activity: Not Currently  Other Topics Concern   Not on file  Social History Narrative   Patient lives with her daughter   Right handed'   2 sons   Social Determinants of Health   Financial Resource Strain: Low Risk  (05/22/2022)   Overall Financial Resource Strain (CARDIA)    Difficulty of Paying Living Expenses: Not hard at all  Food Insecurity: No Food Insecurity (05/22/2022)   Hunger Vital Sign    Worried About Running Out of Food in the Last Year: Never true    [place] in the Last Year: Never true  Transportation Needs: No Transportation Needs (05/22/2022)   PRAPARE - Hydrologist (Medical): No    Lack of Transportation (Non-Medical): No  Physical Activity: Inactive (05/22/2022)   Exercise Vital Sign    Days of Exercise per Week: 0 days    Minutes of Exercise per Session: 0 min  Stress: No Stress Concern Present (05/22/2022)   [place]    Feeling of Stress : Not at all  Social Connections: Unknown (05/22/2022)   Social Connection and Isolation Panel [NHANES]    Frequency of Communication with Friends and Family: Not on file    Frequency of Social Gatherings with Friends and Family: More than three times a week    Attends Religious Services: Not on Advertising copywriter or Organizations: Not on file    Attends Archivist Meetings: Not on file    Marital Status: Not on file    Tobacco Counseling Counseling given: Not Answered Tobacco comments: 20 years ago   Clinical Intake:  Pre-visit preparation  completed: Yes        Diabetes: No  How often do you need to have someone help you when you read instructions, pamphlets, or other written materials from your doctor or pharmacy?: [place] Needed?: No      Activities of Daily Living    05/22/2022   12:42 PM 10/31/2021    7:28 AM  In your present state of health,  do you have any difficulty performing the following activities:  Hearing? 1 1  Vision? 1 0  Difficulty concentrating or making decisions? 1 1  Comment Taking medication as directed   Walking or climbing stairs? 1 1  Dressing or bathing? 1 1  Doing errands, shopping? 1 1  Preparing Food and eating ? Y   Using the Toilet? Y   In the past six months, have you accidently leaked urine? Y   Do you have problems with loss of bowel control? Y   Managing your Medications? Y   Managing your Finances? Y   Housekeeping or managing your Housekeeping? Y     Patient Care Team: [name], FNP as PCP - General (Family Medicine)  Indicate any recent Medical Services you may have received from other than [facility] providers in the past year (date may be approximate).     Assessment:   This is a routine wellness examination for Harshini.  I connected with  Lassandra Amalfitano Lippe on 05/22/22 by a audio enabled telemedicine application and verified that I am speaking with the correct person using two identifiers.  Patient Location: Home  Provider Location: Office/Clinic  I discussed the limitations of evaluation and management by telemedicine. The patient expressed understanding and agreed to proceed.   Hearing/Vision screen Hearing Screening - Comments:: Hearing aids Vision Screening - Comments::  Wears corrective lenses  Dietary issues and exercise activities discussed: Current Exercise Habits: The patient does not participate in regular exercise at present   Goals Addressed             This Visit's Progress    Follow up with Primary Care  Provider       As needed.       Depression Screen    05/22/2022   12:37 PM 07/27/2021   12:17 PM 06/15/2021   10:38 AM 02/14/2021   12:03 PM 12/21/2020    8:30 AM 10/12/2020   11:12 AM 06/08/2020   10:44 AM  PHQ 2/9 Scores  PHQ - 2 Score 0 0 0 1 0 0   PHQ- 9 Score  0 0 4  0   Exception Documentation       Other- indicate reason in comment box  Not completed       Discussed with patient.    Fall Risk    05/22/2022   12:41 PM 10/19/2021   11:28 AM 08/05/2021    8:55 AM 07/27/2021   12:16 PM 07/06/2021    1:35 PM  Fall Risk   Falls in the past year? 1 1 0 0 0  Number falls in past yr: 1 1 0 0 0  Injury with Fall? 1 1 0 0 0  Risk for fall due to :  History of fall(s)  No Fall Risks   Follow up Falls evaluation completed;Falls prevention discussed Falls evaluation completed  Falls evaluation completed     FALL RISK PREVENTION PERTAINING TO THE HOME: Home free of loose throw rugs in walkways, pet beds, electrical cords, etc? Yes  Adequate lighting in your home to reduce risk of falls? No   ASSISTIVE DEVICES UTILIZED TO PREVENT FALLS: Life alert? Yes  Use of a cane, walker or w/c? Yes , wheelchair  TIMED UP AND GO: Was the test performed? No .   Cognitive Function:    05/22/2022   12:41 PM  MMSE - Mini Mental State Exam  Not completed: Unable to complete      07/07/2021  7:00 AM  Montreal Cognitive Assessment   Visuospatial/ Executive (0/5) 0  Naming (0/3) 2  Attention: Read list of digits (0/2) 2  Attention: Read list of letters (0/1) 1  Attention: Serial 7 subtraction starting at 100 (0/3) 1  Language: Repeat phrase (0/2) 1  Language : Fluency (0/1) 0  Abstraction (0/2) 0  Delayed Recall (0/5) 2  Orientation (0/6) 4  Total 13  Adjusted Score (based on education) 13      12/21/2020    8:35 AM 12/19/2019    8:44 AM  6CIT Screen  What Year? 0 points 0 points  What month? 0 points 0 points  What time? 0 points 0 points  Count back from 20 0 points   Months in  reverse 0 points 0 points  Repeat phrase 0 points   Total Score 0 points     Immunizations Immunization History  Administered Date(s) Administered   Influenza,inj,Quad PF,6+ Mos 12/31/2012   Influenza-Unspecified 01/03/2011, 12/29/2011   PFIZER Comirnaty(Gray Top)Covid-19 Tri-Sucrose Vaccine 06/28/2020   PFIZER(Purple Top)SARS-COV-2 Vaccination 03/27/2019, 04/17/2019   PPD Test 11/02/2021   Pneumococcal Conjugate-13 08/19/2014   Pneumococcal Polysaccharide-23 03/13/1996, 03/13/2017   Tdap 10/08/2018   Zoster Recombinat (Shingrix) 06/28/2021   Zoster, Live 03/13/2004, 03/13/2006   Shingles vaccine- 1st dose completed. Agrees to update immunization record upon completion.   Screening Tests Health Maintenance  Topic Date Due   COVID-19 Vaccine (4 - 2023-24 season) 06/07/2022 (Originally 11/11/2021)   Zoster Vaccines- Shingrix (2 of 2) 08/22/2022 (Originally 08/23/2021)   Medicare Annual Wellness (AWV)  05/22/2023   DTaP/Tdap/Td (2 - Td or Tdap) 10/07/2028   Pneumonia Vaccine 70+ Years old  Completed   DEXA SCAN  Completed   HPV VACCINES  Aged Out   INFLUENZA VACCINE  Discontinued    Health Maintenance There are no preventive care reminders to display for this patient.  Lung Cancer Screening: (Low Dose CT Chest recommended if Age 8-80 years, 30 pack-year currently smoking OR have quit w/in 15years.) does not qualify.   Hepatitis C Screening: does not qualify.  Vision Screening: Recommended annual ophthalmology exams for early detection of glaucoma and other disorders of the eye.  Dental Screening: Recommended annual dental exams for proper oral hygiene.  Community Resource Referral / Chronic Care Management: CRR required this visit?  No   CCM required this visit?  No      Plan:     I have personally reviewed and noted the following in the patient's chart:   Medical and social history Use of alcohol, tobacco or illicit drugs  Current medications and supplements  including opioid prescriptions. Patient is not currently taking opioid prescriptions. Functional ability and status Nutritional status Physical activity Advanced directives List of other physicians Hospitalizations, surgeries, and ER visits in previous 12 months Vitals Screenings to include cognitive, depression, and falls Referrals and appointments  In addition, I have reviewed and discussed with patient certain preventive protocols, quality metrics, and best practice recommendations. A written personalized care plan for preventive services as well as general preventive health recommendations were provided to patient.     Leta Jungling, LPN   [id number]

## 2022-05-29 DIAGNOSIS — J449 Chronic obstructive pulmonary disease, unspecified: Secondary | ICD-10-CM | POA: Diagnosis not present

## 2022-05-29 DIAGNOSIS — F0390 Unspecified dementia without behavioral disturbance: Secondary | ICD-10-CM | POA: Diagnosis not present

## 2022-05-29 DIAGNOSIS — I10 Essential (primary) hypertension: Secondary | ICD-10-CM | POA: Diagnosis not present

## 2022-06-02 DIAGNOSIS — E785 Hyperlipidemia, unspecified: Secondary | ICD-10-CM | POA: Diagnosis not present

## 2022-06-02 DIAGNOSIS — G4700 Insomnia, unspecified: Secondary | ICD-10-CM | POA: Diagnosis not present

## 2022-06-02 DIAGNOSIS — J90 Pleural effusion, not elsewhere classified: Secondary | ICD-10-CM | POA: Diagnosis not present

## 2022-06-02 DIAGNOSIS — F02C2 Dementia in other diseases classified elsewhere, severe, with psychotic disturbance: Secondary | ICD-10-CM | POA: Diagnosis not present

## 2022-06-02 DIAGNOSIS — J984 Other disorders of lung: Secondary | ICD-10-CM | POA: Diagnosis not present

## 2022-06-02 DIAGNOSIS — F312 Bipolar disorder, current episode manic severe with psychotic features: Secondary | ICD-10-CM | POA: Diagnosis not present

## 2022-06-02 DIAGNOSIS — J449 Chronic obstructive pulmonary disease, unspecified: Secondary | ICD-10-CM | POA: Diagnosis not present

## 2022-06-02 DIAGNOSIS — E559 Vitamin D deficiency, unspecified: Secondary | ICD-10-CM | POA: Diagnosis not present

## 2022-06-12 DEATH — deceased

## 2022-07-26 ENCOUNTER — Other Ambulatory Visit (INDEPENDENT_AMBULATORY_CARE_PROVIDER_SITE_OTHER): Payer: Self-pay | Admitting: Nurse Practitioner

## 2022-07-26 DIAGNOSIS — I6523 Occlusion and stenosis of bilateral carotid arteries: Secondary | ICD-10-CM

## 2022-07-31 ENCOUNTER — Ambulatory Visit (INDEPENDENT_AMBULATORY_CARE_PROVIDER_SITE_OTHER): Payer: Medicare Other | Admitting: Vascular Surgery

## 2022-07-31 ENCOUNTER — Encounter (INDEPENDENT_AMBULATORY_CARE_PROVIDER_SITE_OTHER): Payer: Medicare Other

## 2022-09-04 ENCOUNTER — Other Ambulatory Visit: Payer: Self-pay | Admitting: Family

## 2022-09-04 DIAGNOSIS — F01518 Vascular dementia, unspecified severity, with other behavioral disturbance: Secondary | ICD-10-CM

## 2022-09-05 ENCOUNTER — Telehealth: Payer: Self-pay | Admitting: *Deleted

## 2022-09-05 NOTE — Progress Notes (Signed)
  Care Coordination  Outreach Note  09/05/2022 Name: Natalie [name] MRN: [id number] DOB: 20-Mar-1929   Care Coordination Outreach Attempts: An unsuccessful telephone outreach was attempted today to offer the patient information about available care coordination services.  Follow Up Plan:  Additional outreach attempts will be made to offer the patient care coordination information and services.   Encounter Outcome:  No Answer  Burman Nieves, CCMA Care Coordination Care Guide Direct Dial: [phone]

## 2022-09-05 NOTE — Progress Notes (Signed)
Per pt daughter who returned my call, pt died 06/08/2022. Closed referral

## 2022-09-11 ENCOUNTER — Telehealth: Payer: Self-pay | Admitting: Family

## 2022-09-11 NOTE — Telephone Encounter (Signed)
Call pt please see how she is doing  I have not seen her since 10/2021  Please sch appt if she would like

## 2022-09-15 NOTE — Telephone Encounter (Signed)
Spoke with pt's daughter and she stated that pt passed away in 06-09-2022.

## 2022-09-18 NOTE — Telephone Encounter (Signed)
She was such a kind person.     Olegario Messier, how do I correct record to show deceased?
